# Patient Record
Sex: Male | Born: 1970 | Race: Black or African American | Hispanic: No | Marital: Single
Health system: Midwestern US, Community
[De-identification: ages and names within clinical notes are randomized; demographics above are authoritative.]

## PROBLEM LIST (undated history)

## (undated) DIAGNOSIS — I10 Essential (primary) hypertension: Secondary | ICD-10-CM

## (undated) DIAGNOSIS — G4733 Obstructive sleep apnea (adult) (pediatric): Secondary | ICD-10-CM

## (undated) DIAGNOSIS — G4730 Sleep apnea, unspecified: Secondary | ICD-10-CM

## (undated) DIAGNOSIS — E669 Obesity, unspecified: Secondary | ICD-10-CM

## (undated) DIAGNOSIS — I471 Supraventricular tachycardia, unspecified: Secondary | ICD-10-CM

## (undated) DIAGNOSIS — I498 Other specified cardiac arrhythmias: Secondary | ICD-10-CM

## (undated) DIAGNOSIS — K219 Gastro-esophageal reflux disease without esophagitis: Secondary | ICD-10-CM

## (undated) DIAGNOSIS — I5030 Unspecified diastolic (congestive) heart failure: Secondary | ICD-10-CM

## (undated) DIAGNOSIS — R001 Bradycardia, unspecified: Secondary | ICD-10-CM

## (undated) DIAGNOSIS — K3184 Gastroparesis: Secondary | ICD-10-CM

## (undated) DIAGNOSIS — K30 Functional dyspepsia: Secondary | ICD-10-CM

## (undated) DIAGNOSIS — K2270 Barrett's esophagus without dysplasia: Secondary | ICD-10-CM

## (undated) HISTORY — DX: Sleep apnea, unspecified: G47.30

## (undated) HISTORY — DX: Gastroparesis: K31.84

## (undated) HISTORY — PX: INSERT / REPLACE / REMOVE PACEMAKER: SUR710

## (undated) HISTORY — DX: Supraventricular tachycardia, unspecified: I47.10

## (undated) HISTORY — DX: Bradycardia, unspecified: R00.1

## (undated) HISTORY — DX: Gastro-esophageal reflux disease without esophagitis: K21.9

## (undated) HISTORY — DX: Essential (primary) hypertension: I10

## (undated) HISTORY — DX: Obstructive sleep apnea (adult) (pediatric): G47.33

## (undated) HISTORY — DX: Supraventricular tachycardia: I47.1

## (undated) HISTORY — PX: UPPER GASTROINTESTINAL ENDOSCOPY: SHX188

## (undated) HISTORY — DX: Barrett's esophagus without dysplasia: K22.70

## (undated) HISTORY — DX: Obesity, unspecified: E66.9

## (undated) HISTORY — PX: COLONOSCOPY: SHX174

## (undated) HISTORY — DX: Unspecified diastolic (congestive) heart failure: I50.30

## (undated) HISTORY — DX: Other specified cardiac arrhythmias: I49.8

---

## 1992-07-23 HISTORY — PX: APPENDECTOMY: SHX54

## 2009-04-11 ENCOUNTER — Ambulatory Visit (HOSPITAL_BASED_OUTPATIENT_CLINIC_OR_DEPARTMENT_OTHER): Admission: RE | Admit: 2009-04-11 | Discharge: 2009-04-11 | Payer: Self-pay | Admitting: Cardiology

## 2009-07-22 ENCOUNTER — Emergency Department (HOSPITAL_COMMUNITY): Admission: EM | Admit: 2009-07-22 | Discharge: 2009-07-22 | Payer: Self-pay | Admitting: Emergency Medicine

## 2009-11-09 ENCOUNTER — Emergency Department (HOSPITAL_COMMUNITY): Admission: EM | Admit: 2009-11-09 | Discharge: 2009-11-09 | Payer: Self-pay | Admitting: Emergency Medicine

## 2010-04-15 ENCOUNTER — Encounter: Payer: Self-pay | Admitting: Internal Medicine

## 2010-04-25 ENCOUNTER — Emergency Department (HOSPITAL_COMMUNITY)
Admission: EM | Admit: 2010-04-25 | Discharge: 2010-04-25 | Payer: Self-pay | Source: Home / Self Care | Admitting: Emergency Medicine

## 2010-04-28 ENCOUNTER — Encounter (INDEPENDENT_AMBULATORY_CARE_PROVIDER_SITE_OTHER): Payer: Self-pay | Admitting: *Deleted

## 2010-05-11 ENCOUNTER — Telehealth (INDEPENDENT_AMBULATORY_CARE_PROVIDER_SITE_OTHER): Payer: Self-pay | Admitting: *Deleted

## 2010-05-25 ENCOUNTER — Encounter (INDEPENDENT_AMBULATORY_CARE_PROVIDER_SITE_OTHER): Payer: Self-pay | Admitting: *Deleted

## 2010-06-14 ENCOUNTER — Ambulatory Visit: Payer: Self-pay | Admitting: Cardiology

## 2010-06-29 ENCOUNTER — Emergency Department (HOSPITAL_COMMUNITY): Admission: EM | Admit: 2010-06-29 | Discharge: 2009-07-27 | Payer: Self-pay | Admitting: Internal Medicine

## 2010-08-02 ENCOUNTER — Encounter (INDEPENDENT_AMBULATORY_CARE_PROVIDER_SITE_OTHER): Payer: Self-pay | Admitting: *Deleted

## 2010-08-22 NOTE — Letter (Signed)
Summary: Appointment - Missed  [facility], Main Office  [address]   [place], [place] [address]   Phone: [phone]  Fax: [phone]     April 28, 2010 MRN: [id number]   [facility] [address] [place], [place]  [address]   Dear Jim Hill,  Our records indicate you missed your appointment on 04-27-10 with the [facility].                     It is very important that we reach you to reschedule this appointment. We look forward to participating in your health care needs. Please contact us at the number listed above at your earliest convenience to reschedule this appointment.     Sincerely,    Glass blower/designer

## 2010-08-22 NOTE — Progress Notes (Signed)
  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

## 2010-08-22 NOTE — Letter (Signed)
Summary: Appointment - Missed  [facility], Main Office  [address]   [place], [place] [address]   Phone: [phone]  Fax: [phone]     May 25, 2010 MRN: [id number]   [facility] [address] [place], [place]  [address]   Dear Mr. DENAULT,  Our records indicate you missed your appointment on  04-27-10  with the [facility].  It is very important that we reach you to reschedule this appointment. We look forward to participating in your health care needs. Please contact us at the number listed above at your earliest convenience to reschedule this appointment.     Sincerely,    Glass blower/designer

## 2010-08-22 NOTE — Miscellaneous (Signed)
Summary: Device preload  Clinical Lists Changes  Observations: Added new observation of PPM INDICATN: Brady (04/15/2010 11:42) Added new observation of MAGNET RTE: BOL 85 ERI 65 (04/15/2010 11:42) Added new observation of PPMLEADSTAT2: active (04/15/2010 11:42) Added new observation of PPMLEADSER2: [id number] (04/15/2010 11:42) Added new observation of PPMLEADMOD2: 5076  (04/15/2010 11:42) Added new observation of PPMLEADDOI2: 03/14/2005  (04/15/2010 11:42) Added new observation of PPMLEADLOC2: RV  (04/15/2010 11:42) Added new observation of PPMLEADSTAT1: active  (04/15/2010 11:42) Added new observation of PPMLEADSER1: [id number]  (04/15/2010 11:42) Added new observation of PPMLEADMOD1: 5076  (04/15/2010 11:42) Added new observation of PPMLEADDOI1: 03/14/2005  (04/15/2010 11:42) Added new observation of PPMLEADLOC1: RA  (04/15/2010 11:42) Added new observation of PPM IMP MD: NOT IMPLANTED BY [place]  (04/15/2010 11:42) Added new observation of PPM DOI: 03/14/2005  (04/15/2010 11:42) Added new observation of PPM SERL#: [id number]  (04/15/2010 11:42) Added new observation of PPM MODL#: [id number]  (04/15/2010 11:42) Added new observation of PACEMAKERMFG: Medtronic  (04/15/2010 11:42) Added new observation of PPM REFER MD: Vonna Drafts  (04/15/2010 11:42) Added new observation of PACEMAKER MD: Hillis Range, MD  (04/15/2010 11:42)      PPM Specifications Following MD:  Hillis Range, MD     Referring MD:  Vonna Drafts PPM Vendor:  Medtronic     PPM Model Number:  [id number]     PPM Serial Number:  [id number] PPM DOI:  03/14/2005     PPM Implanting MD:  NOT IMPLANTED BY [place]  Lead 1    Location: RA     DOI: 03/14/2005     Model #: [id number]     Serial #: [id number]     Status: active Lead 2    Location: RV     DOI: 03/14/2005     Model #: [id number]     Serial #: [id number]     Status: active  Magnet Response Rate:  BOL 85 ERI 65  Indications:  Huston Foley

## 2010-08-24 NOTE — Letter (Signed)
Summary: Device-Delinquent Check  [facility], Main Office  [address]   [place], [place] [address]   Phone: [phone]  Fax: [phone]     August 02, 2010 MRN: [id number]   [facility] [address] [place], [place]  [address]   Dear Mr. MONACO,  According to our records, you have not had your implanted device checked in the recommended period of time.  We are unable to determine appropriate device function without checking your device on a regular basis.  Please call our office to schedule an appointment , with Dr Thera Flake soon as possible.  If you are having your device checked by another physician, please call us so that we may update our records.  Thank you,  Letta Moynahan, EMT  August 02, 2010 4:21 PM  [facility] certified

## 2010-08-28 ENCOUNTER — Encounter: Payer: Self-pay | Admitting: Internal Medicine

## 2010-10-05 LAB — CBC
HCT: 39.9 % (ref 39.0–52.0)
Hemoglobin: 13.6 g/dL (ref 13.0–17.0)
MCH: 28.6 pg (ref 26.0–34.0)
MCHC: 34.1 g/dL (ref 30.0–36.0)
MCV: 84 fL (ref 78.0–100.0)
Platelets: 232 10*3/uL (ref 150–400)
RBC: 4.75 MIL/uL (ref 4.22–5.81)
RDW: 12.9 % (ref 11.5–15.5)
WBC: 6.8 10*3/uL (ref 4.0–10.5)

## 2010-10-05 LAB — BASIC METABOLIC PANEL
BUN: 10 mg/dL (ref 6–23)
CO2: 22 mEq/L (ref 19–32)
Calcium: 8.9 mg/dL (ref 8.4–10.5)
Chloride: 106 mEq/L (ref 96–112)
Creatinine, Ser: 1.28 mg/dL (ref 0.4–1.5)
GFR calc Af Amer: >60 mL/min (ref >60)
GFR calc non Af Amer: >60 mL/min (ref >60)
Glucose, Bld: 117 mg/dL — ABNORMAL HIGH (ref 70–99)
Potassium: 3.5 mEq/L (ref 3.5–5.1)
Sodium: 136 mEq/L (ref 135–145)

## 2010-10-08 LAB — DIFFERENTIAL
Basophils Absolute: 0 10*3/uL (ref 0.0–0.1)
Basophils Relative: 0 % (ref 0–1)
Eosinophils Absolute: 0.2 10*3/uL (ref 0.0–0.7)
Eosinophils Relative: 4 % (ref 0–5)
Lymphocytes Relative: 39 % (ref 12–46)
Lymphs Abs: 2.3 10*3/uL (ref 0.7–4.0)
Monocytes Absolute: 0.5 10*3/uL (ref 0.1–1.0)
Monocytes Relative: 9 % (ref 3–12)
Neutro Abs: 2.8 10*3/uL (ref 1.7–7.7)
Neutrophils Relative %: 48 % (ref 43–77)

## 2010-10-08 LAB — POCT I-STAT, CHEM 8
BUN: 16 mg/dL (ref 6–23)
Calcium, Ion: 1.11 mmol/L — ABNORMAL LOW (ref 1.12–1.32)
Chloride: 107 mEq/L (ref 96–112)
Creatinine, Ser: 1.5 mg/dL (ref 0.4–1.5)
Glucose, Bld: 85 mg/dL (ref 70–99)
HCT: 42 % (ref 39.0–52.0)
Hemoglobin: 14.3 g/dL (ref 13.0–17.0)
Potassium: 4 mEq/L (ref 3.5–5.1)
Sodium: 141 mEq/L (ref 135–145)
TCO2: 29 mmol/L (ref 0–100)

## 2010-10-08 LAB — CBC
HCT: 41.5 % (ref 39.0–52.0)
Hemoglobin: 14 g/dL (ref 13.0–17.0)
MCHC: 33.7 g/dL (ref 30.0–36.0)
MCV: 86.5 fL (ref 78.0–100.0)
Platelets: 233 10*3/uL (ref 150–400)
RBC: 4.8 MIL/uL (ref 4.22–5.81)
RDW: 13.8 % (ref 11.5–15.5)
WBC: 5.8 10*3/uL (ref 4.0–10.5)

## 2010-10-08 LAB — POCT CARDIAC MARKERS
CKMB, poc: 3.2 ng/mL (ref 1.0–8.0)
CKMB, poc: 5.2 ng/mL (ref 1.0–8.0)
Myoglobin, poc: 252 ng/mL (ref 12–200)
Myoglobin, poc: 345 ng/mL (ref 12–200)
Troponin i, poc: <0.05 ng/mL (ref 0.00–0.09)
Troponin i, poc: <0.05 ng/mL (ref 0.00–0.09)

## 2010-10-10 LAB — POCT I-STAT, CHEM 8
BUN: 13 mg/dL (ref 6–23)
Calcium, Ion: 1.1 mmol/L — ABNORMAL LOW (ref 1.12–1.32)
Chloride: 106 mEq/L (ref 96–112)
Creatinine, Ser: 1.3 mg/dL (ref 0.4–1.5)
Glucose, Bld: 105 mg/dL — ABNORMAL HIGH (ref 70–99)
HCT: 44 % (ref 39.0–52.0)
Hemoglobin: 15 g/dL (ref 13.0–17.0)
Potassium: 3.8 mEq/L (ref 3.5–5.1)
Sodium: 141 mEq/L (ref 135–145)
TCO2: 25 mmol/L (ref 0–100)

## 2010-10-10 NOTE — Cardiovascular Report (Signed)
Summary: Certified Letter Returned (not doing f/u)  Certified Letter Returned (not doing f/u)   Imported By: Debby Freiberg 10/03/2010 16:36:40  _____________________________________________________________________  External Attachment:    Type:   Image     Comment:   External Document

## 2010-11-06 ENCOUNTER — Other Ambulatory Visit: Payer: Self-pay | Admitting: Cardiology

## 2010-11-06 DIAGNOSIS — I10 Essential (primary) hypertension: Secondary | ICD-10-CM

## 2010-11-06 MED ORDER — ATENOLOL 50 MG PO TABS: 50.0000 mg | ORAL_TABLET | Freq: Every day | ORAL | Status: DC

## 2010-11-06 NOTE — Telephone Encounter (Signed)
PT NEEDS REFILL FOR ANTENOLOL/USES [facility]. CHART PLACED IN BOX.

## 2010-11-06 NOTE — Telephone Encounter (Signed)
Called requesting refill on Atenolol. escribed to [facility]

## 2010-11-15 ENCOUNTER — Telehealth: Payer: Self-pay | Admitting: Cardiology

## 2010-11-15 NOTE — Telephone Encounter (Signed)
PATIENT WANTS TO TALK TO ANITA ABOUT GETTING ANOTHER STRESS TEST.

## 2010-11-15 NOTE — Telephone Encounter (Signed)
Called stating his work nurse told him he needed to have another stress test for insurance purposes.. Advised him that stress test from 08 EF was 50%. He can't pay out of pocket now because ins will claim this is preexisting. He will talk with nurse again and have her call me to see if he really does need to have another stress test.

## 2010-11-23 ENCOUNTER — Encounter: Payer: Self-pay | Admitting: *Deleted

## 2011-01-01 ENCOUNTER — Telehealth: Payer: Self-pay | Admitting: Cardiology

## 2011-01-01 NOTE — Telephone Encounter (Signed)
Called stating he needs letter from Dr.Jordan to write an appeal letter to his insurance co for his preexisting pacemaker. Spoke w/ his company at [phone]. She states that will have to call [employer] [place] [phone] after 7/1 to appeal to exempt him due his pacemaker so he can obtain insurance without the 6-12 mo delay. Will call after 7/1

## 2011-01-01 NOTE — Telephone Encounter (Signed)
Has a question regarding his pre-existing condition as it relates to his insurance. He said that he needs a note from Dr.Jordan

## 2011-02-01 ENCOUNTER — Telehealth: Payer: Self-pay | Admitting: *Deleted

## 2011-02-01 NOTE — Telephone Encounter (Signed)
Received call stating he has started new job and needs a preexisting form filled out so he can receive his new insurance without delay. Because of his pacemaker  they are telling him  we need to send info so he can receive benefits without 6 mo delay. [address] [place] [phone]; ID # [id number] and spoke w/Tara. She sent form for predetermination and said to change to Preexisting. Filled form out and faxed back to [phone] w/ copies ov from 06/14/10;09/30/09;03/17/09.

## 2011-02-26 ENCOUNTER — Telehealth: Payer: Self-pay | Admitting: Internal Medicine

## 2011-02-26 NOTE — Telephone Encounter (Signed)
Called and spoke with patient   Jim Hill to Urgent Care  He describes this pain as "he is catching a cramp"  This just started one week ago  His device was put in at [facility] 2006  Dr Sw[place] has records and we will get OP note from him

## 2011-02-26 NOTE — Telephone Encounter (Signed)
Pt calling re new pt appt 9-7 with dr Ladona Ridgel, having a pulling sensation at pacer site, not sure if he should be seen sooner or not

## 2011-03-29 ENCOUNTER — Encounter: Payer: Self-pay | Admitting: *Deleted

## 2011-03-29 ENCOUNTER — Encounter: Payer: Self-pay | Admitting: Internal Medicine

## 2011-03-30 ENCOUNTER — Ambulatory Visit (INDEPENDENT_AMBULATORY_CARE_PROVIDER_SITE_OTHER): Payer: BC Managed Care – PPO | Admitting: Internal Medicine

## 2011-03-30 ENCOUNTER — Encounter: Payer: Self-pay | Admitting: Internal Medicine

## 2011-03-30 DIAGNOSIS — I495 Sick sinus syndrome: Secondary | ICD-10-CM

## 2011-03-30 DIAGNOSIS — E669 Obesity, unspecified: Secondary | ICD-10-CM

## 2011-03-30 DIAGNOSIS — I10 Essential (primary) hypertension: Secondary | ICD-10-CM | POA: Insufficient documentation

## 2011-03-30 DIAGNOSIS — Z950 Presence of cardiac pacemaker: Secondary | ICD-10-CM | POA: Insufficient documentation

## 2011-03-30 LAB — PACEMAKER DEVICE OBSERVATION
AL AMPLITUDE: 1.5 mv
AL IMPEDENCE PM: 368 Ohm
ATRIAL PACING PM: 19.71
BAMS-0001: 170 {beats}/min
BATTERY VOLTAGE: 2.97 V
RV LEAD AMPLITUDE: 7.6 mv
RV LEAD IMPEDENCE PM: 544 Ohm
RV LEAD THRESHOLD: 1 V
VENTRICULAR PACING PM: 0.17

## 2011-03-30 NOTE — Progress Notes (Signed)
HPI Jim Hill is referred today for ongoing PPM evaluation. He is a pleasant 40 yo man with a h/o symptomatic bradycardia, s/p PPM. He has morbid obesity. He has hypertension. Since his pacemaker was placed, he has had no recurrent syncope. 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 He admits to dietary indiscretion. He notes occasional headaches and peripheral edema. No other symptoms noted today. No Known Allergies   Current Outpatient Prescriptions  Medication Sig Dispense Refill  . amLODipine (NORVASC) 10 MG tablet 1 tab daily      . aspirin 81 MG tablet Take 81 mg by mouth daily.        . hydrochlorothiazide 25 MG tablet Take 25 mg by mouth daily.        Marland Kitchen omeprazole (PRILOSEC) 20 MG capsule Take 20 mg by mouth daily.           Past Medical History  Diagnosis Date  . Hypertension     ROS:   All systems reviewed and negative except as noted in the HPI.   Past Surgical History  Procedure Date  . Insert / replace / remove pacemaker     status post pacemaker implant August 2006  . Appendectomy 1994     No family history on file.   History   Social History  . Marital Status: Single    Spouse Name: N/A    Number of Children: N/A  . Years of Education: N/A   Occupational History  . Not on file.   Social History Main Topics  . Smoking status: Former Games developer  . Smokeless tobacco: Not on file   Comment: recreational drugs  . Alcohol Use: Not on file  . Drug Use: Not on file  . Sexually Active: Not on file   Other Topics Concern  . Not on file   Social History Narrative   Tobacco history none. He does admit to using recreational drugs in the past. He has not smoked marijuana in 7 years. 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 Both of his parents are in there 50 s and are  in good health.     BP 130/82  Pulse 98  Wt 295 lb (133.811 kg)  Physical Exam:  Obese but well appearing NAD HEENT:  Unremarkable Neck:  No JVD, no thyromegally Lymphatics:  No adenopathy Back:  No CVA tenderness Lungs:  Clear with no wheezes, rales, or rhonchi. HEART:  Regular rate rhythm, no murmurs, no rubs, no clicks Abd:  Obese, soft, positive bowel sounds, no organomegally, no rebound, no guarding Ext:  2 plus pulses, no edema, no cyanosis, no clubbing Skin:  No rashes no nodules Neuro:  CN II through XII intact, motor grossly intact  DEVICE  Normal device function.  See PaceArt for details.    Assess/Plan:

## 2011-03-30 NOTE — Assessment & Plan Note (Signed)
His blood pressure today is well controlled. He will continue his current medical therapy and maintain a low-sodium diet. 

## 2011-03-30 NOTE — Assessment & Plan Note (Signed)
I discussed the importance of weight loss with the patient. He weighs 295 pounds and should be under 250 pounds. Maintaining a low-fat low-sodium diet has also been discussed.

## 2011-03-30 NOTE — Assessment & Plan Note (Signed)
His device is working normally. He has 1-2 years of battery longevity remaining. We'll plan to recheck in several months.

## 2011-03-30 NOTE — Patient Instructions (Signed)
Your physician wants you to follow-up in: 6 months with device clinic and 12 months with Dr Taylor You will receive a reminder letter in the mail two months in advance. If you don't receive a letter, please call our office to schedule the follow-up appointment.  

## 2011-06-20 ENCOUNTER — Telehealth: Payer: Self-pay | Admitting: Internal Medicine

## 2011-06-20 NOTE — Telephone Encounter (Signed)
Pt having some chest pain off and on, has flutters feels like floating in water happens daily, pls advise

## 2011-06-20 NOTE — Telephone Encounter (Signed)
Returned call to patient, and left a message for him to call me back

## 2011-06-21 NOTE — Telephone Encounter (Signed)
Pt rtn call from yesterday, pls call [phone]

## 2011-06-21 NOTE — Telephone Encounter (Signed)
Called the # [phone] and left another message for the patient to call me back.

## 2011-06-26 NOTE — Telephone Encounter (Signed)
Has a fluttering from time to time Thinks it may be related to something he eats or drinks This all started for 3 weeks and it happens daily  Last for 10-20 mins and it stops by resting or if he goes about his day  He will notice that it has gone  Notices it the most at night  Will schedule him an appointment to come in and have his device checked as well as follow up with Dr Ladona Ridgel

## 2011-06-28 NOTE — Telephone Encounter (Signed)
Discussed with Dr Ladona Ridgel and will have pt come in for device check to see if this shows anything and go from there  If there are abnormalities schedule follow up with Ladona Ridgel if normal reassure

## 2011-07-02 ENCOUNTER — Encounter: Payer: BC Managed Care – PPO | Admitting: *Deleted

## 2011-07-09 ENCOUNTER — Encounter: Payer: BC Managed Care – PPO | Admitting: *Deleted

## 2011-07-09 ENCOUNTER — Ambulatory Visit (INDEPENDENT_AMBULATORY_CARE_PROVIDER_SITE_OTHER): Payer: BC Managed Care – PPO | Admitting: *Deleted

## 2011-07-09 ENCOUNTER — Encounter: Payer: Self-pay | Admitting: Internal Medicine

## 2011-07-09 ENCOUNTER — Ambulatory Visit (INDEPENDENT_AMBULATORY_CARE_PROVIDER_SITE_OTHER): Payer: BC Managed Care – PPO | Admitting: Physician Assistant

## 2011-07-09 ENCOUNTER — Encounter: Payer: Self-pay | Admitting: *Deleted

## 2011-07-09 ENCOUNTER — Encounter: Payer: Self-pay | Admitting: Physician Assistant

## 2011-07-09 VITALS — BP 142/86 | HR 71 | Ht 71.0 in | Wt 302.0 lb

## 2011-07-09 DIAGNOSIS — R002 Palpitations: Secondary | ICD-10-CM

## 2011-07-09 DIAGNOSIS — Z950 Presence of cardiac pacemaker: Secondary | ICD-10-CM

## 2011-07-09 DIAGNOSIS — K219 Gastro-esophageal reflux disease without esophagitis: Secondary | ICD-10-CM

## 2011-07-09 DIAGNOSIS — R0989 Other specified symptoms and signs involving the circulatory and respiratory systems: Secondary | ICD-10-CM

## 2011-07-09 DIAGNOSIS — I10 Essential (primary) hypertension: Secondary | ICD-10-CM

## 2011-07-09 DIAGNOSIS — R0683 Snoring: Secondary | ICD-10-CM | POA: Insufficient documentation

## 2011-07-09 DIAGNOSIS — I498 Other specified cardiac arrhythmias: Secondary | ICD-10-CM

## 2011-07-09 DIAGNOSIS — R079 Chest pain, unspecified: Secondary | ICD-10-CM

## 2011-07-09 DIAGNOSIS — R0609 Other forms of dyspnea: Secondary | ICD-10-CM

## 2011-07-09 LAB — PACEMAKER DEVICE OBSERVATION
AL AMPLITUDE: 1.4578 mv
AL IMPEDENCE PM: 348 Ohm
AL THRESHOLD: 1 V
ATRIAL PACING PM: 16.33
BAMS-0001: 170 {beats}/min
BATTERY VOLTAGE: 2.96 V
RV LEAD AMPLITUDE: 4.4845 mv
RV LEAD IMPEDENCE PM: 464 Ohm
RV LEAD THRESHOLD: 1 V
VENTRICULAR PACING PM: 0.13

## 2011-07-09 LAB — BASIC METABOLIC PANEL
CO2: 26 mEq/L (ref 19–32)
Calcium: 9.3 mg/dL (ref 8.4–10.5)
Chloride: 108 mEq/L (ref 96–112)
Creatinine, Ser: 1.2 mg/dL (ref 0.4–1.5)
GFR: 86.27 mL/min (ref >60.00)
Glucose, Bld: 90 mg/dL (ref 70–99)
Sodium: 141 mEq/L (ref 135–145)

## 2011-07-09 MED ORDER — OMEPRAZOLE 20 MG PO CPDR: 40.0000 mg | DELAYED_RELEASE_CAPSULE | Freq: Every day | ORAL | Status: DC

## 2011-07-09 NOTE — Assessment & Plan Note (Addendum)
Etiology unclear.  Symptoms somewhat atypical.  Will obtain records from hospital in [place].  Will need to see CT scan report and ED report.  Apparently had negative heart cath in 2008.  Has risk factors of obesity, HTN, substance abuse and sedentary lifestyle (truck driver).  Will arrange ETT-echo.  Follow up with Dr. [name] or me in 3-4 weeks.

## 2011-07-09 NOTE — Patient Instructions (Signed)
Your physician has requested that you have an echocardiogram DX DYSPNEA AND CHEST PAIN. Echocardiography is a painless test that uses sound waves to create images of your heart. It provides your doctor with information about the size and shape of your heart and how well your heart's chambers and valves are working. This procedure takes approximately one hour. There are no restrictions for this procedure.  Your physician has requested that you have a stress echocardiogram W/O CONTRAST DX DYSPNEA AND CHEST PAIN. For further information please visit [email or web address] Please follow instruction sheet as given.  You have been referred to EITHER TO DR. CLANCE OR DR. SOOD FOR SNORING AND FOR POSSIBLE SLEEP APNEA  Your physician recommends that you return for lab work in: TODAY BMET/BNP 401.1 HTN  Your physician has recommended you make the following change in your medication: INCREASE PRILOSEC 40 MG DAILY

## 2011-07-09 NOTE — Assessment & Plan Note (Signed)
Concern for Afib as he likely has sleep apnea.  However, I spoke with EP RN.  No high atrial rates noted on interrogation of his device.  No need for event monitor at this time.  Will evaluate for sleep apnea.  Obtain records from [place] as noted.

## 2011-07-09 NOTE — Assessment & Plan Note (Signed)
Possible cause of his symptoms.  Increase Prilosec to 40 mg QD.  Consider referral to GI based upon results of current testing.

## 2011-07-09 NOTE — Assessment & Plan Note (Addendum)
He apparently had a sleep test a few years ago.  However, he notes that he sleeps on an incline and awakens in the middle of the night short of breath.  No signs of volume overload today.  Will check BMET and BNP.  Check 2D Echo given cardiomegaly and LAE noted on CT.  He snores and has daytime hypersomnolence.  I will refer to sleep medicine for evaluation.

## 2011-07-09 NOTE — Progress Notes (Signed)
[address] [place], [place]  [address] Phone: [phone] Fax:  [phone]  Date:  07/09/2011   Name:  Jim Hill       DOB:  11-13-1970 MRN:  [id number]  PCP:  Dr. Nehemiah Hill Primary Cardiologist:  Dr. Lewayne Hill  Primary Electrophysiologist:  Dr. Lewayne Hill    History of Present Illness: Jim Hill is a 40 y.o. male who presents as an add on for chest pain.  He was in the pacer clinic today for routine visit and noted his symptoms to the nurse.  He has a history of symptomatic bradycardia, status post pacemaker implant in 2006, morbid obesity and hypertension.  He was previously followed by Dr. Peter Hill before switching to Dr. Lewayne Hill.  He reportedly has a normal heart catheterization done in 2008 and a normal stress echocardiogram done in August 2010. His heart catheterization was done in [place].    He is a Naval architect.  Over the weekend, he was in [place] and woke up in the middle of the night with palpitations and chest pain.  He went to the emergency room at the [facility] in [place], [place].  He apparently ruled out for myocardial infarction.  He apparently had a CT scan of his chest.  He has a piece of paper with him that says cardiomegaly was noted as well as left atrial enlargement and an echocardiogram was suggested.    The patient notes left-sided chest aching over the past month.  He can note it with exertion.  He can also note it at rest.  Exertion does not necessarily bring it on.  He does feel short of breath and diaphoretic with his symptoms.  He feels his heart fluttering.  He has syncope or near syncope.  He does feel some arm pain.  He also feels some pain in his back.  His blood pressure was apparently in the 170s/90s in the hospital in [place] this past weekend.  He was not told his heart was in any type of arrhythmia at that time.  Of note, interrogation of his pacemaker today demonstrated no  high atrial heart rates.  He does note difficulty with belching and acid reflux.  Food does not necessarily bring on chest pain.  He denies pleuritic chest symptoms.  Of note, his symptoms typically last 30 mins to 1 hour.    Past Medical History  Diagnosis Date  . Hypertension     Current Outpatient Prescriptions  Medication Sig Dispense Refill  . aspirin 81 MG tablet Take 81 mg by mouth daily.        Marland Kitchen omeprazole (PRILOSEC) 20 MG capsule Take 20 mg by mouth daily.          Allergies: No Known Allergies  History  Substance Use Topics  . Smoking status: Former Games developer  . Smokeless tobacco: Not on file   Comment: recreational drugs  . Alcohol Use: Not on file     ROS:  Please see the history of present illness.   Gastrointestinal ROS: positive for - diarrhea.  Cardiovascular ROS: positive for - orthopnea and paroxysmal nocturnal dyspnea and negative for - edema.  All other systems reviewed and negative.   PHYSICAL EXAM: VS:  BP 142/86  Pulse 71  Ht 5\' 11"  (1.803 m)  Wt 302 lb (136.986 kg)  BMI 42.12 kg/m2 Well nourished, well developed, in no acute distress HEENT: normal Neck: no JVD Endocrine:  No thyromegaly Cardiac:  normal S1,  S2; RRR; no murmur Lungs:  clear to auscultation bilaterally, no wheezing, rhonchi or rales Abd: soft, nontender, no hepatomegaly Ext: no edema Skin: warm and dry Neuro:  CNs 2-12 intact, no focal abnormalities noted Psych: normal affect  EKG:   Sinus rhythm, heart rate 71, normal axis, nonspecific ST-T wave changes, no change from prior tracings  ASSESSMENT AND PLAN:

## 2011-07-09 NOTE — Assessment & Plan Note (Signed)
Uncontrolled.  However, medications recently changed by PCP.  He is not sure of name.  Monitor for now.

## 2011-07-09 NOTE — Assessment & Plan Note (Signed)
As noted, pacer was interrogated today and was functioning appropriately.

## 2011-07-09 NOTE — Progress Notes (Signed)
Patient presents for device clinic pacemaker check.  Has been having problems with chest pain for about 2-3 weeks.  Pain occurs with exertion, associated shortness of breath and diaphoresis.  Usually lasts 20-30 minutes, relieved with rest and Aspirin.  Not always associated with exertion.   Device interrogated and found to be functioning normally.  Some high rate episodes, are sinus tachycardia.   No changes made today.  See PaceArt report for full details.  Discussed chest pain with Tereso Newcomer, PA, he will see patient today at 11AM.  Patient was taking Amlodipine and HCTZ, states MD changed to combination drug, not sure of name.    Gypsy Balsam, RN, BSN 07/09/2011 10:00 AM

## 2011-07-10 ENCOUNTER — Telehealth: Payer: Self-pay | Admitting: Physician Assistant

## 2011-07-10 NOTE — Telephone Encounter (Addendum)
ROI faxed to [facility] @ [phone] to Obtain Records For [facility] 07/10/11/km  Records Received from [place] gave to [facility] 07/10/11/KM

## 2011-07-19 ENCOUNTER — Ambulatory Visit (HOSPITAL_COMMUNITY): Payer: BC Managed Care – PPO | Attending: Internal Medicine | Admitting: Radiology

## 2011-07-19 ENCOUNTER — Other Ambulatory Visit (HOSPITAL_COMMUNITY): Payer: Self-pay | Admitting: Cardiology

## 2011-07-19 DIAGNOSIS — R0609 Other forms of dyspnea: Secondary | ICD-10-CM

## 2011-07-19 DIAGNOSIS — I10 Essential (primary) hypertension: Secondary | ICD-10-CM

## 2011-07-19 DIAGNOSIS — R079 Chest pain, unspecified: Secondary | ICD-10-CM

## 2011-07-19 DIAGNOSIS — R002 Palpitations: Secondary | ICD-10-CM

## 2011-07-19 DIAGNOSIS — R072 Precordial pain: Secondary | ICD-10-CM | POA: Insufficient documentation

## 2011-07-19 DIAGNOSIS — R0989 Other specified symptoms and signs involving the circulatory and respiratory systems: Secondary | ICD-10-CM

## 2011-07-19 MED ORDER — PERFLUTREN PROTEIN A MICROSPH IV SUSP
3.0000 mL | Freq: Once | INTRAVENOUS | Status: AC
  Administered 2011-07-19: 3 mL via INTRAVENOUS

## 2011-07-20 NOTE — Progress Notes (Signed)
Addended by: Micki Riley C on: 07/20/2011 12:46 PM   Modules accepted: Orders

## 2011-07-20 NOTE — Progress Notes (Signed)
Addended by: Micki Riley C on: 07/20/2011 01:18 PM   Modules accepted: Orders

## 2011-07-23 NOTE — Progress Notes (Signed)
Addended by: Lacie Scotts on: 07/23/2011 02:11 PM   Modules accepted: Orders

## 2011-07-25 ENCOUNTER — Telehealth: Payer: Self-pay | Admitting: Internal Medicine

## 2011-07-25 NOTE — Telephone Encounter (Signed)
Informed patient of results/KLanier,RN  

## 2011-07-25 NOTE — Telephone Encounter (Signed)
Fu call °Patient returning your call °

## 2011-08-02 ENCOUNTER — Institutional Professional Consult (permissible substitution): Payer: BC Managed Care – PPO | Admitting: Pulmonary Disease

## 2011-08-02 NOTE — Progress Notes (Signed)
Addended by: Laurell Coalson S on: 08/02/2011 09:00 AM   Modules accepted: Orders  

## 2011-08-07 ENCOUNTER — Ambulatory Visit: Payer: BC Managed Care – PPO | Admitting: Physician Assistant

## 2011-10-16 ENCOUNTER — Emergency Department: Payer: Self-pay | Admitting: *Deleted

## 2011-10-16 LAB — CBC
HCT: 40 % (ref 40.0–52.0)
HGB: 13.5 g/dL (ref 13.0–18.0)
MCH: 28.6 pg (ref 26.0–34.0)

## 2011-10-16 LAB — BASIC METABOLIC PANEL
Anion Gap: 10 (ref 7–16)
Chloride: 105 mmol/L (ref 98–107)
Creatinine: 1.36 mg/dL — ABNORMAL HIGH (ref 0.60–1.30)
EGFR (Non-African Amer.): >60
Glucose: 96 mg/dL (ref 65–99)
Osmolality: 282 (ref 275–301)
Potassium: 4.2 mmol/L (ref 3.5–5.1)
Sodium: 142 mmol/L (ref 136–145)

## 2011-10-17 LAB — TROPONIN I: Troponin-I: <0.02 ng/mL

## 2011-10-24 ENCOUNTER — Telehealth: Payer: Self-pay | Admitting: Internal Medicine

## 2011-10-24 NOTE — Telephone Encounter (Signed)
New Msg: Pt calling wanting to speak with nurse/MD regarding pt getting another sleep study. Please return pt call to discuss further.

## 2011-10-24 NOTE — Telephone Encounter (Signed)
I spoke with patient and let him

## 2011-10-24 NOTE — Telephone Encounter (Signed)
I spoke with him and let him know that he can call over to Dr Shelle Iron at [phone] and reschedule hsi appointment but it has been since Jan.  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  I told him that it's been almost 3 months since he canceled the first appointment and he really needed to call and reschedule  He verbalized the understanding

## 2011-11-23 ENCOUNTER — Telehealth: Payer: Self-pay | Admitting: Internal Medicine

## 2011-11-23 NOTE — Telephone Encounter (Signed)
PT CALLING FOR STRESS TEST RESULTS FORM December, PLS CALL

## 2011-11-23 NOTE — Telephone Encounter (Signed)
Spoke with patient  He could not remember his stress test results.  I gave him the results on 07/25/11.  He remembers [place] talking but does not remember the results.  Gave him the results again

## 2011-12-26 ENCOUNTER — Encounter: Payer: Self-pay | Admitting: Internal Medicine

## 2011-12-26 ENCOUNTER — Ambulatory Visit (INDEPENDENT_AMBULATORY_CARE_PROVIDER_SITE_OTHER): Payer: Self-pay | Admitting: Internal Medicine

## 2011-12-26 VITALS — BP 124/90 | HR 80 | Ht 71.0 in | Wt 304.4 lb

## 2011-12-26 DIAGNOSIS — Z950 Presence of cardiac pacemaker: Secondary | ICD-10-CM

## 2011-12-26 LAB — PACEMAKER DEVICE OBSERVATION
AL AMPLITUDE: 1.6 mv
AL IMPEDENCE PM: 360 Ohm
AL THRESHOLD: 0.5 V
ATRIAL PACING PM: 11.1
BAMS-0001: 170 {beats}/min
BATTERY VOLTAGE: 2.81 V
RV LEAD AMPLITUDE: 6.2 mv
RV LEAD IMPEDENCE PM: 504 Ohm
RV LEAD THRESHOLD: 1.5 V
VENTRICULAR PACING PM: 0.6

## 2011-12-26 NOTE — Assessment & Plan Note (Signed)
His device is working normally but is it reached elective replacement. The voltage is 2.8. He is not pacemaker dependent. Because he has lost his insurance, and because he does not wish to proceed a large go from the hospital, and because he is not pacemaker dependent, and because he has several months of battery longevity, I recommended that we hold off on scheduling pacemaker generator change out. If he is not able to obtain insurance in the next several months, we will attempt to obtain a donated pacemaker for implantation.

## 2011-12-26 NOTE — Patient Instructions (Signed)
Your physician wants you to follow-up in: 6 months with Dr Taylor You will receive a reminder letter in the mail two months in advance. If you don't receive a letter, please call our office to schedule the follow-up appointment.  

## 2011-12-26 NOTE — Progress Notes (Signed)
HPI [name] returns today for followup. He is a very pleasant 41 year old man with a history of symptomatic bradycardia and syncope, status post permanent pacemaker insertion in 2006. He has reached elective replacement. The patient has had no recurrent syncope since his pacemaker was placed. He denies chest pain, shortness of breath, peripheral edema, or additional syncope. He is concerned about his finances as he recently lost his job. No Known Allergies   Current Outpatient Prescriptions  Medication Sig Dispense Refill  . aspirin 81 MG tablet Take 81 mg by mouth daily.        Marland Kitchen omeprazole (PRILOSEC) 20 MG capsule Take 2 capsules (40 mg total) by mouth daily.  60 capsule  11  . triamterene-hydrochlorothiazide (DYAZIDE) 50-25 MG per capsule Take 1 capsule by mouth every morning.         Past Medical History  Diagnosis Date  . Hypertension   . Chest pain     normal LHC 2008;  ETT-Echo 8/10: normal  . Obesity   . GERD (gastroesophageal reflux disease)   . Symptomatic bradycardia     s/p pacer in 2006 in [place]    ROS:   All systems reviewed and negative except as noted in the HPI.   Past Surgical History  Procedure Date  . Insert / replace / remove pacemaker     status post pacemaker implant August 2006  . Appendectomy 1994     No family history on file.   History   Social History  . Marital Status: Single    Spouse Name: N/A    Number of Children: N/A  . Years of Education: N/A   Occupational History  . Not on file.   Social History Main Topics  . Smoking status: Never Smoker   . Smokeless tobacco: Not on file   Comment: recreational drugs  . Alcohol Use: Not on file  . Drug Use: Not on file  . Sexually Active: Not on file   Other Topics Concern  . Not on file   Social History Narrative   Tobacco history none. He does admit to using recreational drugs in the past. He has not smoked marijuana in 7 years. He is currently working as a [employer] architect, He  has 6 siblings all in good health. Both of his parents are in there 50 s and are  in good health.     BP 124/90  Pulse 80  Ht 5\' 11"  (1.803 m)  Wt 304 lb 6.4 oz (138.075 kg)  BMI 42.46 kg/m2  Physical Exam:  Well appearing middle-aged man, NAD HEENT: Unremarkable Neck:  No JVD, no thyromegally Lungs:  Clear with no wheezes, rales, or rhonchi. HEART:  Regular rate rhythm, no murmurs, no rubs, no clicks Abd:  soft, obese, positive bowel sounds, no organomegally, no rebound, no guarding Ext:  2 plus pulses, no edema, no cyanosis, no clubbing Skin:  No rashes no nodules Neuro:  CN II through XII intact, motor grossly intact  DEVICE  Normal device function.  See PaceArt for details. Device is at elective replacement.  Assess/Plan:

## 2012-02-19 ENCOUNTER — Telehealth: Payer: Self-pay | Admitting: Internal Medicine

## 2012-02-19 NOTE — Telephone Encounter (Signed)
Still does not have insurance. He has one he thinks will cover his PPM change out so he wanted to schedule his appt in October or November for surgery. I told him we can not schedule that far in advance. I told him when he finds out for sure about insurance to let us know. Per Tresa Endo, I asked Glynda Jaeger to schedule him an appointment in September so he can discuss this further with Dr Ladona Ridgel.

## 2012-02-19 NOTE — Telephone Encounter (Signed)
New  msg Pt wants to talk to you about scheduling surgery. Please call

## 2012-03-28 ENCOUNTER — Encounter: Payer: Self-pay | Admitting: *Deleted

## 2012-03-28 ENCOUNTER — Ambulatory Visit (INDEPENDENT_AMBULATORY_CARE_PROVIDER_SITE_OTHER): Payer: Self-pay | Admitting: Internal Medicine

## 2012-03-28 ENCOUNTER — Encounter: Payer: Self-pay | Admitting: Internal Medicine

## 2012-03-28 VITALS — BP 156/94 | HR 102 | Ht 71.0 in | Wt 297.8 lb

## 2012-03-28 DIAGNOSIS — I498 Other specified cardiac arrhythmias: Secondary | ICD-10-CM | POA: Insufficient documentation

## 2012-03-28 DIAGNOSIS — Z950 Presence of cardiac pacemaker: Secondary | ICD-10-CM

## 2012-03-28 DIAGNOSIS — R55 Syncope and collapse: Secondary | ICD-10-CM

## 2012-03-28 DIAGNOSIS — Z45018 Encounter for adjustment and management of other part of cardiac pacemaker: Secondary | ICD-10-CM

## 2012-03-28 DIAGNOSIS — E669 Obesity, unspecified: Secondary | ICD-10-CM

## 2012-03-28 LAB — PACEMAKER DEVICE OBSERVATION
AL AMPLITUDE: 1.6 mv
ATRIAL PACING PM: 20.8
BAMS-0001: 170 {beats}/min
BATTERY VOLTAGE: 2.81 V
RV LEAD AMPLITUDE: 5.5 mv
RV LEAD IMPEDENCE PM: 520 Ohm
VENTRICULAR PACING PM: 0.3

## 2012-03-28 NOTE — Addendum Note (Signed)
Addended by: Dennis Bast F on: 03/28/2012 09:41 AM   Modules accepted: Orders

## 2012-03-28 NOTE — Assessment & Plan Note (Signed)
Today we discussed the importance of weight loss. He will try to lose weight.

## 2012-03-28 NOTE — Patient Instructions (Signed)
Pacer generator change out  See instruction sheet

## 2012-03-28 NOTE — Assessment & Plan Note (Signed)
His device is at [facility]. He still does not have insurance coverage and at this point, I will attempt to obtain a donated device and proceed with change out. I have discussed the risks/benefits/goals/expectations of the procedure and he wishes to proceed.

## 2012-03-28 NOTE — Progress Notes (Signed)
HPI  No Known Allergies   Current Outpatient Prescriptions  Medication Sig Dispense Refill  . aspirin 81 MG tablet Take 81 mg by mouth daily.        Marland Kitchen omeprazole (PRILOSEC) 20 MG capsule Take 2 capsules (40 mg total) by mouth daily.  60 capsule  11  . triamterene-hydrochlorothiazide (DYAZIDE) 50-25 MG per capsule Take 1 capsule by mouth every morning.         Past Medical History  Diagnosis Date  . Hypertension   . Chest pain     normal LHC 2008;  ETT-Echo 8/10: normal  . Obesity   . GERD (gastroesophageal reflux disease)   . Symptomatic bradycardia     s/p pacer in 2006 in [place]  . Other specified cardiac dysrhythmias     ROS:   All systems reviewed and negative except as noted in the HPI.   Past Surgical History  Procedure Date  . Insert / replace / remove pacemaker     status post pacemaker implant August 2006  . Appendectomy 1994     No family history on file.   History   Social History  . Marital Status: Single    Spouse Name: N/A    Number of Children: N/A  . Years of Education: N/A   Occupational History  . Not on file.   Social History Main Topics  . Smoking status: Never Smoker   . Smokeless tobacco: Not on file   Comment: recreational drugs  . Alcohol Use: Not on file  . Drug Use: Not on file  . Sexually Active: Not on file   Other Topics Concern  . Not on file   Social History Narrative   Tobacco history none. He does admit to using recreational drugs in the past. He has not smoked marijuana in 7 years. 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 Both of his parents are in there 50 s and are  in good health.     BP 156/94  Pulse 102  Ht 5\' 11"  (1.803 m)  Wt 297 lb 12.8 oz (135.081 kg)  BMI 41.53 kg/m2  Physical Exam:  Well appearing NAD HEENT: Unremarkable Neck:  No JVD, no thyromegally Lymphatics:  No adenopathy Back:  No CVA tenderness Lungs:  Clear HEART:  Regular rate rhythm, no  murmurs, no rubs, no clicks Abd:  soft, positive bowel sounds, no organomegally, no rebound, no guarding Ext:  2 plus pulses, no edema, no cyanosis, no clubbing Skin:  No rashes no nodules Neuro:  CN II through XII intact, motor grossly intact   DEVICE  Normal device function.  See PaceArt for details. Device at [facility]  Assess/Plan:

## 2012-04-04 ENCOUNTER — Encounter (HOSPITAL_COMMUNITY): Payer: Self-pay | Admitting: Pharmacist

## 2012-04-11 ENCOUNTER — Ambulatory Visit (INDEPENDENT_AMBULATORY_CARE_PROVIDER_SITE_OTHER): Payer: Self-pay | Admitting: *Deleted

## 2012-04-11 DIAGNOSIS — K219 Gastro-esophageal reflux disease without esophagitis: Secondary | ICD-10-CM

## 2012-04-11 DIAGNOSIS — E669 Obesity, unspecified: Secondary | ICD-10-CM

## 2012-04-11 DIAGNOSIS — I10 Essential (primary) hypertension: Secondary | ICD-10-CM

## 2012-04-11 LAB — CBC WITH DIFFERENTIAL/PLATELET
Basophils Absolute: 0 10*3/uL (ref 0.0–0.1)
Basophils Relative: 0.5 % (ref 0.0–3.0)
Eosinophils Absolute: 0.1 10*3/uL (ref 0.0–0.7)
Hemoglobin: 13.9 g/dL (ref 13.0–17.0)
Lymphocytes Relative: 35 % (ref 12.0–46.0)
Lymphs Abs: 1.9 10*3/uL (ref 0.7–4.0)
MCHC: 33 g/dL (ref 30.0–36.0)
MCV: 85.5 fl (ref 78.0–100.0)
Monocytes Absolute: 0.8 10*3/uL (ref 0.1–1.0)
Neutro Abs: 2.7 10*3/uL (ref 1.4–7.7)
RBC: 4.91 Mil/uL (ref 4.22–5.81)
RDW: 14.1 % (ref 11.5–14.6)

## 2012-04-11 LAB — BASIC METABOLIC PANEL
CO2: 27 mEq/L (ref 19–32)
Calcium: 9.6 mg/dL (ref 8.4–10.5)
Chloride: 105 mEq/L (ref 96–112)
Glucose, Bld: 88 mg/dL (ref 70–99)
Sodium: 138 mEq/L (ref 135–145)

## 2012-04-15 MED ORDER — SODIUM CHLORIDE 0.9 % IR SOLN
80.0000 mg | Status: DC
  Filled 2012-04-15: qty 2

## 2012-04-15 MED ORDER — DEXTROSE 5 % IV SOLN
3.0000 g | INTRAVENOUS | Status: DC
  Filled 2012-04-15: qty 3000

## 2012-04-16 ENCOUNTER — Encounter (HOSPITAL_COMMUNITY)
Admission: RE | Disposition: A | Discharge status: Home / Self Care | Payer: Self-pay | Source: Ambulatory Visit | Attending: Internal Medicine

## 2012-04-16 ENCOUNTER — Ambulatory Visit (HOSPITAL_COMMUNITY)
Admission: RE | Admit: 2012-04-16 | Discharge: 2012-04-16 | Disposition: A | Discharge status: Home / Self Care | Payer: Self-pay | Source: Ambulatory Visit | Attending: Internal Medicine | Admitting: Internal Medicine

## 2012-04-16 ENCOUNTER — Ambulatory Visit (HOSPITAL_COMMUNITY): Payer: Self-pay

## 2012-04-16 DIAGNOSIS — E669 Obesity, unspecified: Secondary | ICD-10-CM | POA: Insufficient documentation

## 2012-04-16 DIAGNOSIS — Z45018 Encounter for adjustment and management of other part of cardiac pacemaker: Secondary | ICD-10-CM | POA: Insufficient documentation

## 2012-04-16 DIAGNOSIS — I10 Essential (primary) hypertension: Secondary | ICD-10-CM | POA: Insufficient documentation

## 2012-04-16 DIAGNOSIS — Z950 Presence of cardiac pacemaker: Secondary | ICD-10-CM

## 2012-04-16 DIAGNOSIS — I498 Other specified cardiac arrhythmias: Secondary | ICD-10-CM

## 2012-04-16 DIAGNOSIS — R55 Syncope and collapse: Secondary | ICD-10-CM

## 2012-04-16 HISTORY — PX: PERMANENT PACEMAKER GENERATOR CHANGE: SHX6022

## 2012-04-16 SURGERY — PERMANENT PACEMAKER GENERATOR CHANGE
Anesthesia: LOCAL

## 2012-04-16 MED ORDER — FENTANYL CITRATE 0.05 MG/ML IJ SOLN: 25.0000 ug | INTRAMUSCULAR | Status: DC | PRN

## 2012-04-16 MED ORDER — MUPIROCIN 2 % EX OINT
TOPICAL_OINTMENT | Freq: Once | CUTANEOUS | Status: AC
  Administered 2012-04-16: 1 via NASAL

## 2012-04-16 MED ORDER — MUPIROCIN 2 % EX OINT
TOPICAL_OINTMENT | CUTANEOUS | Status: AC
  Administered 2012-04-16: 1 via NASAL
  Filled 2012-04-16: qty 22

## 2012-04-16 MED ORDER — ACETAMINOPHEN-CODEINE #3 300-30 MG PO TABS: 1.0000 | ORAL_TABLET | Freq: Four times a day (QID) | ORAL | Status: DC | PRN

## 2012-04-16 MED ORDER — SODIUM CHLORIDE 0.45 % IV SOLN
INTRAVENOUS | Status: DC
  Administered 2012-04-16: 08:00:00 via INTRAVENOUS

## 2012-04-16 MED ORDER — FENTANYL CITRATE 0.05 MG/ML IJ SOLN
INTRAMUSCULAR | Status: AC
  Filled 2012-04-16: qty 2

## 2012-04-16 MED ORDER — SODIUM CHLORIDE 0.9 % IV SOLN: 250.0000 mL | INTRAVENOUS | Status: DC

## 2012-04-16 MED ORDER — MIDAZOLAM HCL 5 MG/5ML IJ SOLN
INTRAMUSCULAR | Status: AC
  Filled 2012-04-16: qty 5

## 2012-04-16 MED ORDER — SODIUM CHLORIDE 0.9 % IJ SOLN: 3.0000 mL | Freq: Two times a day (BID) | INTRAMUSCULAR | Status: DC

## 2012-04-16 MED ORDER — CHLORHEXIDINE GLUCONATE 4 % EX LIQD
60.0000 mL | Freq: Once | CUTANEOUS | Status: AC
  Administered 2012-04-16: 4 via TOPICAL

## 2012-04-16 MED ORDER — ONDANSETRON HCL 4 MG/2ML IJ SOLN: 4.0000 mg | Freq: Four times a day (QID) | INTRAMUSCULAR | Status: DC | PRN

## 2012-04-16 MED ORDER — LIDOCAINE HCL (PF) 1 % IJ SOLN
INTRAMUSCULAR | Status: AC
  Filled 2012-04-16: qty 60

## 2012-04-16 MED ORDER — SODIUM CHLORIDE 0.9 % IJ SOLN: 3.0000 mL | INTRAMUSCULAR | Status: DC | PRN

## 2012-04-16 MED ORDER — ACETAMINOPHEN 325 MG PO TABS: 325.0000 mg | ORAL_TABLET | ORAL | Status: DC | PRN

## 2012-04-16 NOTE — H&P (Signed)
HPI   The patient is a 41 yo man with a h/o symptomatic bradycardia s/p PPM who has reached ERI on his old PPM and now presents for PPM generator change. No fever/chills/night sweats.  No Known Allergies  Current Outpatient Prescriptions   Medication  Sig  Dispense  Refill   .  aspirin 81 MG tablet  Take 81 mg by mouth daily.     Marland Kitchen  omeprazole (PRILOSEC) 20 MG capsule  Take 2 capsules (40 mg total) by mouth daily.  60 capsule  11   .  triamterene-hydrochlorothiazide (DYAZIDE) 50-25 MG per capsule  Take 1 capsule by mouth every morning.      Past Medical History   Diagnosis  Date   .  Hypertension    .  Chest pain      normal LHC 2008; ETT-Echo 8/10: normal   .  Obesity    .  GERD (gastroesophageal reflux disease)    .  Symptomatic bradycardia      s/p pacer in 2006 in [place]   .  Other specified cardiac dysrhythmias     ROS:  All systems reviewed and negative except as noted in the HPI.  Past Surgical History   Procedure  Date   .  Insert / replace / remove pacemaker      status post pacemaker implant August 2006   .  Appendectomy  1994    No family history on file.  History    Social History   .  Marital Status:  Single     Spouse Name:  N/A     Number of Children:  N/A   .  Years of Education:  N/A    Occupational History   .  Not on file.    Social History Main Topics   .  Smoking status:  Never Smoker   .  Smokeless tobacco:  Not on file     Comment: recreational drugs    .  Alcohol Use:  Not on file   .  Drug Use:  Not on file   .  Sexually Active:  Not on file    Other Topics  Concern   .  Not on file    Social History Narrative    Tobacco history none. He does admit to using recreational drugs in the past. He has not smoked marijuana in 7 years. 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 Both of his parents are in there 50 s and are in good health.    BP 156/94  Pulse 102  Ht 5\' 11"  (1.803 m)  Wt 297 lb 12.8  oz (135.081 kg)  BMI 41.53 kg/m2  Physical Exam:  Well appearing NAD  HEENT: Unremarkable  Neck: No JVD, no thyromegally  Lymphatics: No adenopathy  Back: No CVA tenderness  Lungs: Clear  HEART: Regular rate rhythm, no murmurs, no rubs, no clicks  Abd: soft, positive bowel sounds, no organomegally, no rebound, no guarding  Ext: 2 plus pulses, no edema, no cyanosis, no clubbing  Skin: No rashes no nodules  Neuro: CN II through XII intact, motor grossly intact  DEVICE  Normal device function. See PaceArt for details. Device at ERI  Assess/Plan:  Pacemaker - Jim Bunting, MD  His device is at [facility]. He still does not have insurance coverage and at this point, I will attempt to obtain a donated device and proceed with change out. I have discussed the  risks/benefits/goals/expectations of the procedure and he wishes to proceed. Obesity - Jim Bunting, MD 03/28/2012 9:23 AM Signed  Today we discussed the importance of weight loss. He will try to lose weight.

## 2012-04-16 NOTE — Op Note (Signed)
DDD PPM generator removal, pocket revision and insertion of a new device without immediate complication. W#[id number].

## 2012-04-16 NOTE — Progress Notes (Signed)
Called by nurse in Short Stay to address patient's concerns regarding pain after his PPM generator change. He reports Dr. Ladona Ridgel was planning to provide a short course of prescription pain medication. I confirmed with Dr. Ladona Ridgel via phone that Mr. Tison can be given a prescription for Tylenol 3 or its equivalent x 2-3 days. This was provided to Mr. Sennett prior to discharge. If he experiences increasing pain, swelling, warmth or redness at the PPM site, or if he develops fever, he will call our office immediately.

## 2012-04-16 NOTE — Interval H&P Note (Signed)
History and Physical Interval Note:  04/16/2012 10:01 AM  Jim Hill  has presented today for surgery, with the diagnosis of end of life generator  The various methods of treatment have been discussed with the patient and family. After consideration of risks, benefits and other options for treatment, the patient has consented to  Procedure(s) (LRB) with comments: PERMANENT PACEMAKER GENERATOR CHANGE (N/A) as a surgical intervention .  The patient's history has been reviewed, patient examined, no change in status, stable for surgery.  I have reviewed the patient's chart and labs.  Questions were answered to the patient's satisfaction.     [name]

## 2012-04-16 NOTE — Op Note (Signed)
NAMEBEDFORD, Jim Hill NO.:  [id number]  MEDICAL RECORD NO.:  [id number]  LOCATION:  [facility]                         FACILITY:  [facility]  PHYSICIAN:  Doylene Canning. Ladona Ridgel, MD    DATE OF BIRTH:  01/15/1971  DATE OF PROCEDURE:  04/16/2012 DATE OF DISCHARGE:  04/16/2012                              OPERATIVE REPORT   PROCEDURE PERFORMED:  Removal of a previously implanted Medtronic pacemaker and insertion of a new Medtronic pacemaker with pacemaker pocket revision.  INTRODUCTION:  The patient is a 41 year old male with symptomatic bradycardia who underwent permanent pacemaker insertion at an outside institution many years ago.  He has reached elective replacement on his device.  He is now referred for removal of his old device and insertion of a new one.  PROCEDURE:  After informed consent was obtained, the patient was taken to the Diagnostic EP Lab in the fasting state.  After usual preparation and draping, intravenous fentanyl and midazolam was given for sedation. A 30 mL of lidocaine was infiltrated over the old pacemaker insertion site.  A 5-cm incision was carried out over this region.  Electrocautery was utilized to dissect down to the pacemaker pocket.  It was entered with electrocautery and the generator was removed with gentle traction. The leads were freed up from the binding sites.  The leads were evaluated.  The P-waves were 2, the R-waves were 8.  The pacing impedance was 380 in the atrium and 500 in the ventricle.  The threshold was 0.5 at 0.4 in the atrium and 1 at 0.4 on the right ventricle.  At this point to accommodate the slightly larger pacemaker, the pacemaker pocket was revised with electrocautery.  Electrocautery was utilized to assure hemostasis.  The Medtronic Sensia dual-chamber pacemaker, serial number [id number] was connected to the atrial and the RV leads and placed back in the subcutaneous pocket.  The pocket was irrigated with antibiotic  irrigation.  The incision was closed with 2-0 and 3-0 Vicryl. Benzoin and Steri-Strips were painted on the skin, pressure dressing was applied, and the patient was returned to his room in satisfactory condition.  COMPLICATIONS:  There were no immediate procedure complications.  RESULTS:  This demonstrate successful removal of a previously implanted Medtronic dual-chamber pacemaker followed by pacemaker pocket revision to accommodate the larger-shaped pacemaker followed by insertion of a new dual-chamber pacemaker.     Doylene Canning. Ladona Ridgel, MD     GWT/MEDQ  D:  04/16/2012  T:  04/16/2012  Job:  [id number]

## 2012-04-17 ENCOUNTER — Encounter: Payer: Self-pay | Admitting: *Deleted

## 2012-06-11 ENCOUNTER — Encounter (HOSPITAL_COMMUNITY): Payer: Self-pay | Admitting: *Deleted

## 2012-06-11 ENCOUNTER — Emergency Department (HOSPITAL_COMMUNITY)
Admission: EM | Admit: 2012-06-11 | Discharge: 2012-06-12 | Disposition: A | Discharge status: Home / Self Care | Payer: Self-pay | Attending: Emergency Medicine | Admitting: Emergency Medicine

## 2012-06-11 ENCOUNTER — Emergency Department (HOSPITAL_COMMUNITY): Payer: Self-pay

## 2012-06-11 DIAGNOSIS — E669 Obesity, unspecified: Secondary | ICD-10-CM | POA: Insufficient documentation

## 2012-06-11 DIAGNOSIS — K219 Gastro-esophageal reflux disease without esophagitis: Secondary | ICD-10-CM | POA: Insufficient documentation

## 2012-06-11 DIAGNOSIS — M25512 Pain in left shoulder: Secondary | ICD-10-CM

## 2012-06-11 DIAGNOSIS — M25519 Pain in unspecified shoulder: Secondary | ICD-10-CM | POA: Insufficient documentation

## 2012-06-11 DIAGNOSIS — I10 Essential (primary) hypertension: Secondary | ICD-10-CM | POA: Insufficient documentation

## 2012-06-11 DIAGNOSIS — R209 Unspecified disturbances of skin sensation: Secondary | ICD-10-CM | POA: Insufficient documentation

## 2012-06-11 DIAGNOSIS — I498 Other specified cardiac arrhythmias: Secondary | ICD-10-CM | POA: Insufficient documentation

## 2012-06-11 DIAGNOSIS — Z79899 Other long term (current) drug therapy: Secondary | ICD-10-CM | POA: Insufficient documentation

## 2012-06-11 DIAGNOSIS — Z7982 Long term (current) use of aspirin: Secondary | ICD-10-CM | POA: Insufficient documentation

## 2012-06-11 NOTE — ED Notes (Signed)
Pt had left shoulder surgery at [facility], dr taylor, on 05/06/12, c/o pain since sugery, also says his arm will go numb off and on, sts today the numbness did not go away and the pain is getting worse

## 2012-06-11 NOTE — ED Provider Notes (Signed)
MSE was initiated and I personally evaluated the patient and placed orders (if any) at  10:41 PM on June 11, 2012.  The patient appears stable so that the remainder of the MSE may be completed by another provider.  41 year old male who pacemaker placed by Dr. Ladona Ridgel on October 25 presents the emergency department complaining of and aching left shoulder on and off with associated chest pain since having surgery. He did not bring this up at his postop visit to see that it could have been his arthritis. He try taking Tylenol without any relief. He describes the pain as constant but occasionally getting worse at random. Rates pain 5/10. Admits to waking up in the middle of the night with this pain associated with numbness and tingling down his left arm. Admits to associated "hot flashes" with palpitations when the pain comes on.  Trevor Mace, PA-C 06/11/12 [phone]

## 2012-06-11 NOTE — ED Provider Notes (Signed)
Medical screening examination/treatment/procedure(s) were performed by non-physician practitioner and as supervising physician I was immediately available for consultation/collaboration.  Ethelda Chick, MD 06/11/12 [phone]

## 2012-06-11 NOTE — ED Notes (Signed)
Patient transported to X-ray 

## 2012-06-12 LAB — BASIC METABOLIC PANEL
BUN: 15 mg/dL (ref 6–23)
Creatinine, Ser: 1.39 mg/dL — ABNORMAL HIGH (ref 0.50–1.35)
GFR calc Af Amer: 72 mL/min — ABNORMAL LOW (ref >90)
GFR calc non Af Amer: 62 mL/min — ABNORMAL LOW (ref >90)
Potassium: 4 mEq/L (ref 3.5–5.1)

## 2012-06-12 LAB — CBC WITH DIFFERENTIAL/PLATELET
Basophils Absolute: 0 10*3/uL (ref 0.0–0.1)
Basophils Relative: 0 % (ref 0–1)
Eosinophils Absolute: 0.2 10*3/uL (ref 0.0–0.7)
HCT: 39.9 % (ref 39.0–52.0)
MCH: 28 pg (ref 26.0–34.0)
MCHC: 34.1 g/dL (ref 30.0–36.0)
MCV: 82.3 fL (ref 78.0–100.0)
Monocytes Absolute: 0.7 10*3/uL (ref 0.1–1.0)
Monocytes Relative: 10 % (ref 3–12)
Neutro Abs: 3.3 10*3/uL (ref 1.7–7.7)
Neutrophils Relative %: 49 % (ref 43–77)
RBC: 4.85 MIL/uL (ref 4.22–5.81)
RDW: 13.2 % (ref 11.5–15.5)
WBC: 6.8 10*3/uL (ref 4.0–10.5)

## 2012-06-12 LAB — TROPONIN I: Troponin I: <0.3 ng/mL (ref <0.30)

## 2012-06-12 MED ORDER — NAPROXEN 500 MG PO TABS
500.0000 mg | ORAL_TABLET | Freq: Two times a day (BID) | ORAL | Status: DC
  Administered 2012-06-12: 500 mg via ORAL
  Filled 2012-06-12: qty 1

## 2012-06-12 MED ORDER — HYDROCODONE-ACETAMINOPHEN 5-500 MG PO TABS: 1.0000 | ORAL_TABLET | Freq: Four times a day (QID) | ORAL | Status: DC | PRN

## 2012-06-12 MED ORDER — NAPROXEN 500 MG PO TABS: 250.0000 mg | ORAL_TABLET | Freq: Two times a day (BID) | ORAL | Status: DC

## 2012-06-12 NOTE — ED Provider Notes (Signed)
History     CSN: [id number]  Arrival date & time 06/11/12  2106   First MD Initiated Contact with Patient 06/11/12 2126      Chief Complaint  Patient presents with  . Shoulder Pain    (Consider location/radiation/quality/duration/timing/severity/associated sxs/prior treatment) HPI 41 year old male presents to the emergency apartment complaining of ongoing left shoulder pain with some radiation to his chest. He reports symptoms have been ongoing for some time, but has really noticed them over the last 3 weeks. He intermittently has taken BC powders and ibuprofen which has  relieved pain. Patient reports over last few days he has had some hot flashes when the pain comes on and radiation into his chest. Pain is in a band across the top of his left shoulder. Moving the shoulder sometimes relieves the pain. He reports often waking up in the night with numbness going down his left arm. He has never seen an orthopedic physician for these pains. Patient is status post a pacemaker replacement on September 25. He has not yet had followup with his cardiologist. Pacemaker for symptomatic bradycardia. He denies any chest pain at present. He denies any shortness of breath or limitation in activity. No orthopnea, no PND. He does report that he felt like a fish was swimming in his chest 3 days ago. This is described as a fluttering feeling.  Past Medical History  Diagnosis Date  . Hypertension   . Chest pain     normal LHC 2008;  ETT-Echo 8/10: normal  . Obesity   . GERD (gastroesophageal reflux disease)   . Symptomatic bradycardia     s/p pacer in 2006 in [place]  . Other specified cardiac dysrhythmias     Past Surgical History  Procedure Date  . Insert / replace / remove pacemaker     status post pacemaker implant August 2006  . Appendectomy 1994    No family history on file.  History  Substance Use Topics  . Smoking status: Never Smoker   . Smokeless tobacco: Not on file     Comment:  recreational drugs  . Alcohol Use: Yes      Review of Systems  See History of Present Illness; otherwise all other systems are reviewed and negative  Allergies  Review of patient's allergies indicates no known allergies.  Home Medications   Current Outpatient Rx  Name  Route  Sig  Dispense  Refill  . ASPIRIN 81 MG PO CHEW   Oral   Chew 81 mg by mouth daily.         . ADULT MULTIVITAMIN W/MINERALS CH   Oral   Take 1 tablet by mouth daily.         Marland Kitchen OMEPRAZOLE 20 MG PO CPDR   Oral   Take 20 mg by mouth daily as needed. For indigestion         . TRIAMTERENE-HCTZ 37.5-25 MG PO CAPS   Oral   Take 1 capsule by mouth daily.           BP 154/93  Pulse 78  Temp 98.2 F (36.8 C) (Oral)  Resp 20  SpO2 98%  Physical Exam  Nursing note and vitals reviewed. Constitutional: He is oriented to person, place, and time. He appears well-developed and well-nourished. No distress.  HENT:  Head: Normocephalic and atraumatic.  Nose: Nose normal.  Mouth/Throat: Oropharynx is clear and moist.  Eyes: Conjunctivae normal and EOM are normal. Pupils are equal, round, and reactive to light.  Neck: Normal  range of motion. Neck supple. No JVD present. No tracheal deviation present. No thyromegaly present.  Cardiovascular: Normal rate, regular rhythm, normal heart sounds and intact distal pulses.  Exam reveals no gallop and no friction rub.   No murmur heard. Pulmonary/Chest: Effort normal and breath sounds normal. No stridor. No respiratory distress. He has no wheezes. He has no rales. He exhibits no tenderness.  Abdominal: Soft. Bowel sounds are normal. He exhibits no distension and no mass. There is no tenderness. There is no rebound and no guarding.  Musculoskeletal: Normal range of motion. He exhibits tenderness (tenderness of palpation of her rotator cuff. Some pain with range of motion of the left shoulder). He exhibits no edema.  Lymphadenopathy:    He has no cervical  adenopathy.  Neurological: He is alert and oriented to person, place, and time. He exhibits normal muscle tone. Coordination normal.  Skin: Skin is warm and dry. No rash noted. He is not diaphoretic. No erythema. No pallor.  Psychiatric: He has a normal mood and affect. His behavior is normal. Judgment and thought content normal.    ED Course  Procedures (including critical care time)  Labs Reviewed  BASIC METABOLIC PANEL - Abnormal; Notable for the following:    Creatinine, Ser 1.39 (*)     GFR calc non Af Amer 62 (*)     GFR calc Af Amer 72 (*)     All other components within normal limits  CBC WITH DIFFERENTIAL  TROPONIN I  PRO B NATRIURETIC PEPTIDE   Dg Chest 2 View  06/11/2012  *RADIOLOGY REPORT*  Clinical Data:  Pacemaker, left shoulder pain, left chest pain  CHEST - 2 VIEW  Comparison: 04/16/2012  Findings: Left subclavian sequential transvenous pacemaker leads project at right atrium and right ventricle. Enlargement of cardiac silhouette. Slight pulmonary vascular congestion. Mediastinal contours normal. No acute infiltrate, pleural effusion or pneumothorax. Bones unremarkable.  IMPRESSION: Enlargement of cardiac silhouette with pulmonary vascular congestion and pacemaker placement. No acute abnormalities.   Original Report Authenticated By: Ulyses Southward, M.D.    Dg Shoulder Left  06/11/2012  *RADIOLOGY REPORT*  Clinical Data: Left chest and shoulder pain, pacemaker placed on 05/16/2012  LEFT SHOULDER - 2+ VIEW  Comparison: None  Findings: Osseous mineralization normal. Pacemaker generator obscures portions of the scapula and left ribs on AP views. AC joint alignment normal. Spurring at dorsal margin of the acromion. No acute fracture, dislocation, or bone destruction. Visualized left ribs appear intact.  IMPRESSION: No acute osseous abnormalities.   Original Report Authenticated By: Ulyses Southward, M.D.     Date: 06/12/2012  Rate: 82  Rhythm: normal sinus rhythm  QRS Axis: normal   Intervals: normal  ST/T Wave abnormalities: nonspecific T wave changes  Conduction Disutrbances:none  Narrative Interpretation:   Old EKG Reviewed: unchanged   1. Shoulder pain, left       MDM  41 year old male with history of pacemaker due to bradycardia who has been having ongoing left shoulder pain for weeks to months. Some radiation into his chest. Patient convinced come to the emergency part tonight by his wife. I feel symptoms are most likely due to to rotator cuff pathology. Given findings on chest x-ray we'll check BNP for possible occult CHF, although I feel this is less likely. Given chest pain will check a troponin. Will encourage him followup with his primary care doctor as well as orthopedics.        Olivia Mackie, MD 06/12/12 [phone]

## 2012-09-22 ENCOUNTER — Encounter: Payer: Self-pay | Admitting: Physician Assistant

## 2012-10-03 ENCOUNTER — Ambulatory Visit (INDEPENDENT_AMBULATORY_CARE_PROVIDER_SITE_OTHER): Payer: Self-pay | Admitting: Cardiology

## 2012-10-03 ENCOUNTER — Encounter: Payer: Self-pay | Admitting: Physician Assistant

## 2012-10-03 ENCOUNTER — Encounter: Payer: Self-pay | Admitting: Cardiology

## 2012-10-03 ENCOUNTER — Encounter: Payer: Self-pay | Admitting: Internal Medicine

## 2012-10-03 VITALS — BP 128/88 | HR 77 | Ht 71.0 in | Wt 303.0 lb

## 2012-10-03 DIAGNOSIS — Z950 Presence of cardiac pacemaker: Secondary | ICD-10-CM

## 2012-10-03 DIAGNOSIS — I498 Other specified cardiac arrhythmias: Secondary | ICD-10-CM

## 2012-10-03 DIAGNOSIS — R001 Bradycardia, unspecified: Secondary | ICD-10-CM

## 2012-10-03 LAB — PACEMAKER DEVICE OBSERVATION
AL AMPLITUDE: 2.8 mv
AL IMPEDENCE PM: 381 Ohm
ATRIAL PACING PM: 8.7
BATTERY VOLTAGE: 2.79 V
RV LEAD IMPEDENCE PM: 555 Ohm
VENTRICULAR PACING PM: 0.2

## 2012-10-03 NOTE — Patient Instructions (Addendum)
Your physician recommends that you schedule a follow-up appointment in: 6 months with [facility] Your physician wants you to follow-up in: 1 year Dr Ladona Ridgel. You will receive a reminder letter in the mail two months in advance. If you don't receive a letter, please call our office to schedule the follow-up appointment.

## 2012-10-07 ENCOUNTER — Encounter: Payer: Self-pay | Admitting: Cardiology

## 2012-10-07 NOTE — Progress Notes (Signed)
ELECTROPHYSIOLOGY OFFICE NOTE  Patient ID: Jim Hill MRN: [id number], DOB/AGE: 1971-03-19   Date of Visit: 10/07/2012  Primary Physician: Renford Dills, MD Primary Cardiologist: [name], MD Reason for Visit: EP/device follow-up  History of Present Illness  Jim Hill is a 42 year old man with symptomatic bradycardia s/p PPM implant in [place], [place] in 2006 and recent PPM generator change Sept 2013 who presents today for routine electrophysiology followup. Since last being seen in our clinic, he reports he is doing well. His only concern today is itching around his incision at the [facility] implant site. He denies pain, redness, swelling, warmth, bleeding/discharge, fever or chills. Today, he denies chest pain or shortness of breath. He denies palpitations, dizziness, near syncope or syncope. He denies LE swelling, orthopnea, PND or recent weight gain. Jim Hill states that he is compliant and tolerating medications without difficulty.  Past Medical History Past Medical History  Diagnosis Date  . Hypertension   . Chest pain     normal LHC 2008;  ETT-Echo 8/10: normal  . Obesity   . GERD (gastroesophageal reflux disease)   . Symptomatic bradycardia     s/p pacer in 2006 in [place]  . Other specified cardiac dysrhythmias     Past Surgical History Past Surgical History  Procedure Laterality Date  . Insert / replace / remove pacemaker      status post pacemaker implant August 2006  . Appendectomy  1994     Allergies/Intolerances No Known Allergies  Current Home Medications Current Outpatient Prescriptions  Medication Sig Dispense Refill  . aspirin 81 MG chewable tablet Chew 81 mg by mouth daily.      . Multiple Vitamin (MULTIVITAMIN WITH MINERALS) TABS Take 1 tablet by mouth daily.      . naproxen (NAPROSYN) 500 MG tablet Take 0.5 tablets (250 mg total) by mouth 2 (two) times daily.  30 tablet  0  . omeprazole (PRILOSEC) 20 MG capsule Take 20 mg by mouth daily as  needed. For indigestion      . triamterene-hydrochlorothiazide (DYAZIDE) 37.5-25 MG per capsule Take 1 capsule by mouth daily.       No current facility-administered medications for this visit.    Social History Social History  . Marital Status: Single   Social History Main Topics  . Smoking status: Never Smoker   . Smokeless tobacco: Not on file  . Alcohol Use: Yes  . Drug Use: No   Social History Narrative   Tobacco history none. He does admit to using recreational drugs in the past. He has not smoked marijuana in 7 years. 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 Both of his parents are in there 50 s and are  in good health.    Review of Systems General: No chills, fever, night sweats or weight changes Cardiovascular: No chest pain, dyspnea on exertion, edema, orthopnea, palpitations, paroxysmal nocturnal dyspnea Dermatological: No rash, lesions or masses Respiratory: No cough, dyspnea Urologic: No hematuria, dysuria Abdominal: No nausea, vomiting, diarrhea, bright red blood per rectum, melena, or hematemesis Neurologic: No visual changes, weakness, changes in mental status All other systems reviewed and are otherwise negative except as noted above.  Physical Exam Blood pressure 128/88, pulse 77, height 5\' 11"  (1.803 m), weight 303 lb (137.44 kg), SpO2 97.00%.  General: Well developed, well appearing 42 year old male in no acute distress. HEENT: Normocephalic, atraumatic. EOMs intact. Sclera nonicteric. Oropharynx clear.  Neck: Supple. No  JVD. Lungs: Respirations regular and unlabored, CTA bilaterally. No wheezes, rales or rhonchi. Heart: RRR. S1, S2 present. No murmurs, rub, S3 or S4. Abdomen: Soft, non-distended.  Extremities: No clubbing, cyanosis or edema. DP/PT/radials 2+ and equal bilaterally. Psych: Normal affect. Neuro: Alert and oriented X 3. Moves all extremities spontaneously.   Diagnostics Device interrogation today shows  normal PPM function with good battery status and stable lead measurements/parameters; no programming changes made; see PaceArt report  Assessment and Plan 1. Symptomatic bradycardia s/p PPM implant 2006, recent generator change Sept 2013 Normal PPM function No programming changes made See PaceArt report Return for device clinic follow-up in 6 months Return for follow-up with Dr. Ladona Ridgel in one year   Signed, Rick Duff, PA-C 10/07/2012, 12:48 PM

## 2013-03-24 LAB — HM COLONOSCOPY: HM Colonoscopy: NORMAL

## 2013-04-16 ENCOUNTER — Ambulatory Visit (INDEPENDENT_AMBULATORY_CARE_PROVIDER_SITE_OTHER): Payer: Self-pay | Admitting: *Deleted

## 2013-04-16 DIAGNOSIS — I498 Other specified cardiac arrhythmias: Secondary | ICD-10-CM

## 2013-04-16 LAB — PACEMAKER DEVICE OBSERVATION
AL THRESHOLD: 0.75 V
ATRIAL PACING PM: 10
BAMS-0001: 175 {beats}/min
RV LEAD IMPEDENCE PM: 542 Ohm
RV LEAD THRESHOLD: 1 V

## 2013-04-16 NOTE — Progress Notes (Signed)
Pacemaker check in clinic. Normal device function. Thresholds, sensing, impedances consistent with previous measurements. Device programmed to maximize longevity. 1 mode switch, <1 minute.  No high ventricular rates noted. Device programmed at appropriate safety margins. Histogram distribution appropriate for patient activity level. Device programmed to optimize intrinsic conduction. Estimated longevity 13 years.  Patient education completed.  ROV 6 months with Dr. Johney Frame.

## 2013-04-22 ENCOUNTER — Emergency Department (HOSPITAL_COMMUNITY)
Admission: EM | Admit: 2013-04-22 | Discharge: 2013-04-22 | Disposition: A | Discharge status: Home / Self Care | Payer: Self-pay | Attending: Emergency Medicine | Admitting: Emergency Medicine

## 2013-04-22 ENCOUNTER — Encounter (HOSPITAL_COMMUNITY): Payer: Self-pay | Admitting: Emergency Medicine

## 2013-04-22 DIAGNOSIS — K219 Gastro-esophageal reflux disease without esophagitis: Secondary | ICD-10-CM | POA: Insufficient documentation

## 2013-04-22 DIAGNOSIS — E669 Obesity, unspecified: Secondary | ICD-10-CM | POA: Insufficient documentation

## 2013-04-22 DIAGNOSIS — K625 Hemorrhage of anus and rectum: Secondary | ICD-10-CM | POA: Insufficient documentation

## 2013-04-22 DIAGNOSIS — Z79899 Other long term (current) drug therapy: Secondary | ICD-10-CM | POA: Insufficient documentation

## 2013-04-22 DIAGNOSIS — R109 Unspecified abdominal pain: Secondary | ICD-10-CM | POA: Insufficient documentation

## 2013-04-22 DIAGNOSIS — Z7982 Long term (current) use of aspirin: Secondary | ICD-10-CM | POA: Insufficient documentation

## 2013-04-22 DIAGNOSIS — Z950 Presence of cardiac pacemaker: Secondary | ICD-10-CM | POA: Insufficient documentation

## 2013-04-22 DIAGNOSIS — K921 Melena: Secondary | ICD-10-CM | POA: Insufficient documentation

## 2013-04-22 DIAGNOSIS — I10 Essential (primary) hypertension: Secondary | ICD-10-CM | POA: Insufficient documentation

## 2013-04-22 LAB — OCCULT BLOOD, POC DEVICE: Fecal Occult Bld: NEGATIVE

## 2013-04-22 LAB — POCT I-STAT, CHEM 8
BUN: 14 mg/dL (ref 6–23)
Chloride: 104 mEq/L (ref 96–112)
Creatinine, Ser: 1.6 mg/dL — ABNORMAL HIGH (ref 0.50–1.35)
Hemoglobin: 15 g/dL (ref 13.0–17.0)
Potassium: 4 mEq/L (ref 3.5–5.1)
Sodium: 139 mEq/L (ref 135–145)
TCO2: 23 mmol/L (ref 0–100)

## 2013-04-22 LAB — CBC
MCV: 82.6 fL (ref 78.0–100.0)
Platelets: 236 10*3/uL (ref 150–400)
RDW: 13.4 % (ref 11.5–15.5)
WBC: 7 10*3/uL (ref 4.0–10.5)

## 2013-04-22 LAB — COMPREHENSIVE METABOLIC PANEL
ALT: 31 U/L (ref 0–53)
AST: 32 U/L (ref 0–37)
Albumin: 4 g/dL (ref 3.5–5.2)
Chloride: 99 mEq/L (ref 96–112)
Creatinine, Ser: 1.31 mg/dL (ref 0.50–1.35)
Sodium: 135 mEq/L (ref 135–145)
Total Bilirubin: 0.6 mg/dL (ref 0.3–1.2)

## 2013-04-22 LAB — URINALYSIS, ROUTINE W REFLEX MICROSCOPIC
Bilirubin Urine: NEGATIVE
Glucose, UA: NEGATIVE mg/dL
Hgb urine dipstick: NEGATIVE
Protein, ur: NEGATIVE mg/dL

## 2013-04-22 LAB — TYPE AND SCREEN
ABO/RH(D): O POS
Antibody Screen: NEGATIVE

## 2013-04-22 LAB — ABO/RH: ABO/RH(D): O POS

## 2013-04-22 MED ORDER — PANTOPRAZOLE SODIUM 40 MG IV SOLR
40.0000 mg | Freq: Once | INTRAVENOUS | Status: AC
  Administered 2013-04-22: 40 mg via INTRAVENOUS
  Filled 2013-04-22: qty 40

## 2013-04-22 MED ORDER — HYDROCODONE-ACETAMINOPHEN 5-325 MG PO TABS: 1.0000 | ORAL_TABLET | Freq: Four times a day (QID) | ORAL | Status: DC | PRN

## 2013-04-22 MED ORDER — SODIUM CHLORIDE 0.9 % IV SOLN
INTRAVENOUS | Status: DC
  Administered 2013-04-22: 06:00:00 via INTRAVENOUS

## 2013-04-22 NOTE — ED Provider Notes (Signed)
CSN: [id number]     Arrival date & time 04/22/13  0421 History   First MD Initiated Contact with Patient 04/22/13 [phone]     Chief Complaint  Patient presents with  . Abdominal Pain  . Rectal Bleeding   (Consider location/radiation/quality/duration/timing/severity/associated sxs/prior Treatment) HPI History provided by patient. About 5 weeks ago had an episode of abdominal cramping lasting a matter of minutes and then same day had some blood in his stool. Noted bright red blood. The following day had some dark stool. He has not had any since that time. Over these last 5 weeks has intermittently had some return of abdominal cramping, typically lasts a few minutes and resolves. This had been going on every few days or so. No known aggravating factors. Today he had 2 episodes of this abdominal cramping lasting about 5 minutes. He became worried and presents here for evaluation. No fevers or chills. No nausea or vomiting. No known sick contacts. No recent travel. Currently pain-free in the emergency department. Past Medical History  Diagnosis Date  . Hypertension   . Chest pain     normal LHC 2008;  ETT-Echo 8/10: normal  . Obesity   . GERD (gastroesophageal reflux disease)   . Symptomatic bradycardia     s/p pacer in 2006 in [place]  . Other specified cardiac dysrhythmias(427.89)    Past Surgical History  Procedure Laterality Date  . Insert / replace / remove pacemaker      status post pacemaker implant August 2006  . Appendectomy  1994   Family History  Problem Relation Age of Onset  . Hypertension Other   . Diabetes Other   . Cancer Other   . CAD Other    History  Substance Use Topics  . Smoking status: Never Smoker   . Smokeless tobacco: Not on file  . Alcohol Use: Yes     Comment: occ    Review of Systems  Constitutional: Negative for fever and chills.  HENT: Negative for neck pain and neck stiffness.   Eyes: Negative for pain.  Respiratory: Negative for shortness of  breath.   Cardiovascular: Negative for chest pain.  Gastrointestinal: Positive for abdominal pain. Negative for vomiting.  Genitourinary: Negative for dysuria.  Musculoskeletal: Negative for back pain.  Skin: Negative for rash.  Neurological: Negative for headaches.  All other systems reviewed and are negative.    Allergies  Review of patient's allergies indicates no known allergies.  Home Medications   Current Outpatient Rx  Name  Route  Sig  Dispense  Refill  . aspirin 81 MG chewable tablet   Oral   Chew 81 mg by mouth daily.         . Multiple Vitamin (MULTIVITAMIN WITH MINERALS) TABS   Oral   Take 1 tablet by mouth daily.         Marland Kitchen omeprazole (PRILOSEC) 20 MG capsule   Oral   Take 20 mg by mouth daily as needed. For indigestion         . triamterene-hydrochlorothiazide (DYAZIDE) 37.5-25 MG per capsule   Oral   Take 1 capsule by mouth daily.          BP 135/118  Pulse 86  Temp(Src) 98.3 F (36.8 C) (Oral)  Resp 18  Ht 5\' 11"  (1.803 m)  Wt 298 lb (135.172 kg)  BMI 41.58 kg/m2  SpO2 97% Physical Exam  Constitutional: He is oriented to person, place, and time. He appears well-developed and well-nourished.  HENT:  Head:  Normocephalic and atraumatic.  Eyes: EOM are normal. Pupils are equal, round, and reactive to light. No scleral icterus.  Neck: Neck supple.  Cardiovascular: Normal rate, regular rhythm and intact distal pulses.   Pulmonary/Chest: Effort normal and breath sounds normal. No respiratory distress.  Abdominal: Soft. Bowel sounds are normal. He exhibits no distension. There is no tenderness. There is no rebound and no guarding.  Genitourinary:  Rectal exam: Nontender, brown stool  Musculoskeletal: Normal range of motion. He exhibits no edema.  Neurological: He is alert and oriented to person, place, and time.  Skin: Skin is warm and dry.    ED Course  Procedures (including critical care time)  Results for orders placed during the  hospital encounter of 04/22/13  CBC      Result Value Range   WBC 7.0  4.0 - 10.5 K/uL   RBC 5.07  4.22 - 5.81 MIL/uL   Hemoglobin 14.1  13.0 - 17.0 g/dL   HCT 16.1  09.6 - 04.5 %   MCV 82.6  78.0 - 100.0 fL   MCH 27.8  26.0 - 34.0 pg   MCHC 33.7  30.0 - 36.0 g/dL   RDW 40.9  81.1 - 91.4 %   Platelets 236  150 - 400 K/uL  COMPREHENSIVE METABOLIC PANEL      Result Value Range   Sodium 135  135 - 145 mEq/L   Potassium 4.1  3.5 - 5.1 mEq/L   Chloride 99  96 - 112 mEq/L   CO2 24  19 - 32 mEq/L   Glucose, Bld 101 (*) 70 - 99 mg/dL   BUN 14  6 - 23 mg/dL   Creatinine, Ser 7.82  0.50 - 1.35 mg/dL   Calcium 9.7  8.4 - 95.6 mg/dL   Total Protein 8.0  6.0 - 8.3 g/dL   Albumin 4.0  3.5 - 5.2 g/dL   AST 32  0 - 37 U/L   ALT 31  0 - 53 U/L   Alkaline Phosphatase 75  39 - 117 U/L   Total Bilirubin 0.6  0.3 - 1.2 mg/dL   GFR calc non Af Amer 66 (*) >90 mL/min   GFR calc Af Amer 77 (*) >90 mL/min  URINALYSIS, ROUTINE W REFLEX MICROSCOPIC      Result Value Range   Color, Urine YELLOW  YELLOW   APPearance CLEAR  CLEAR   Specific Gravity, Urine 1.017  1.005 - 1.030   pH 6.0  5.0 - 8.0   Glucose, UA NEGATIVE  NEGATIVE mg/dL   Hgb urine dipstick NEGATIVE  NEGATIVE   Bilirubin Urine NEGATIVE  NEGATIVE   Ketones, ur NEGATIVE  NEGATIVE mg/dL   Protein, ur NEGATIVE  NEGATIVE mg/dL   Urobilinogen, UA 0.2  0.0 - 1.0 mg/dL   Nitrite NEGATIVE  NEGATIVE   Leukocytes, UA NEGATIVE  NEGATIVE  OCCULT BLOOD, POC DEVICE      Result Value Range   Fecal Occult Bld NEGATIVE  NEGATIVE  POCT I-STAT, CHEM 8      Result Value Range   Sodium 139  135 - 145 mEq/L   Potassium 4.0  3.5 - 5.1 mEq/L   Chloride 104  96 - 112 mEq/L   BUN 14  6 - 23 mg/dL   Creatinine, Ser 2.13 (*) 0.50 - 1.35 mg/dL   Glucose, Bld 086 (*) 70 - 99 mg/dL   Calcium, Ion 5.78  4.69 - 1.23 mmol/L   TCO2 23  0 - 100 mmol/L  Hemoglobin 15.0  13.0 - 17.0 g/dL   HCT 16.1  09.6 - 04.5 %  TYPE AND SCREEN      Result Value Range    ABO/RH(D) O POS     Antibody Screen NEG     Sample Expiration 04/25/2013    ABO/RH      Result Value Range   ABO/RH(D) O POS     For history of GERD, Protonix provided.  Repeat abdominal exam remains benign, soft nontender nondistended. No symptoms in emergency department. Labs reviewed with patient as above. No anemia. guaiac negative.  Patient is comfortable with plan discharge home and GI followup given blood in stools.  Prescription for hydrocodone provided as needed. Abdominal pain precautions provided and he verbalized them as understood. No indication for emergent imaging at this time.  MDM  Diagnoses: Abdominal cramping   Labs reviewed as above. Medications provided. Vital signs and nursing notes reviewed and considered   Sunnie Nielsen, MD 04/24/13 [phone]

## 2013-04-22 NOTE — ED Notes (Signed)
Pt states he is having abd pain and had one stool tonight that was dark in color and the next one was bloody  The pain originally started about 5 weeks ago  Pt states it started out only like twice a week and has become more frequent since Pt states he has been passing small amts over the past 5 weeks

## 2013-04-25 ENCOUNTER — Encounter: Payer: Self-pay | Admitting: Internal Medicine

## 2013-10-30 ENCOUNTER — Encounter: Payer: Self-pay | Admitting: *Deleted

## 2013-12-25 ENCOUNTER — Encounter: Payer: Self-pay | Admitting: Cardiology

## 2013-12-28 ENCOUNTER — Telehealth: Payer: Self-pay | Admitting: Internal Medicine

## 2013-12-28 NOTE — Telephone Encounter (Signed)
12-28-13 certified letter sent regarding pt's past due device check/mt ° °

## 2014-03-01 ENCOUNTER — Encounter: Payer: Self-pay | Admitting: Internal Medicine

## 2014-03-02 ENCOUNTER — Telehealth: Payer: Self-pay | Admitting: Internal Medicine

## 2014-03-02 NOTE — Telephone Encounter (Signed)
New Message  Pt requests a call back to discuss receiving a transmitter.Marland Kitchen. [place] and he believes this will help. Please call back to discuss.

## 2014-03-03 NOTE — Telephone Encounter (Signed)
Confirmed address with pt for [facility] monitor. I informed patient that he would still need to be seen in the ofc for testing of the leads, etc. Pt voiced understanding and asked when he could come in to see Dr.Taylor bc "he didn't want the monitor anyway". [facility].

## 2014-06-07 ENCOUNTER — Encounter: Payer: Self-pay | Admitting: Internal Medicine

## 2014-06-14 ENCOUNTER — Telehealth: Payer: Self-pay | Admitting: Internal Medicine

## 2014-06-14 NOTE — Telephone Encounter (Signed)
PT LIVES IN [place] NOW, HAVING SOME TROUBLE WITH [employer], HAD DEVICE CHECKED AND IT WAS FINE, PLS ADVISE

## 2014-06-14 NOTE — Telephone Encounter (Signed)
Spoke with patient and he is in the hospital now.  His device is functioning normal per the patient.  He has been having these symptoms for about a month but feels very comfortable with Dr. Ladona Ridgelaylor and wanted him to look over all his records.  I explained to him that "numbness" per say did not have to do with his pacemaker and he should follow up with his PCP but I would ask Dr Ladona Ridgelaylor tomorrow

## 2014-07-01 ENCOUNTER — Encounter (HOSPITAL_COMMUNITY): Payer: Self-pay | Admitting: Internal Medicine

## 2014-07-06 ENCOUNTER — Ambulatory Visit (INDEPENDENT_AMBULATORY_CARE_PROVIDER_SITE_OTHER): Payer: BC Managed Care – PPO | Admitting: Internal Medicine

## 2014-07-06 ENCOUNTER — Encounter: Payer: Self-pay | Admitting: *Deleted

## 2014-07-06 ENCOUNTER — Encounter: Payer: Self-pay | Admitting: Internal Medicine

## 2014-07-06 DIAGNOSIS — Z950 Presence of cardiac pacemaker: Secondary | ICD-10-CM | POA: Diagnosis not present

## 2014-07-06 DIAGNOSIS — I495 Sick sinus syndrome: Secondary | ICD-10-CM | POA: Diagnosis not present

## 2014-07-06 DIAGNOSIS — I5032 Chronic diastolic (congestive) heart failure: Secondary | ICD-10-CM | POA: Insufficient documentation

## 2014-07-06 DIAGNOSIS — R002 Palpitations: Secondary | ICD-10-CM | POA: Diagnosis not present

## 2014-07-06 LAB — MDC_IDC_ENUM_SESS_TYPE_INCLINIC
Battery Impedance: 149 Ohm
Battery Remaining Longevity: 141 mo
Battery Voltage: 2.78 V
Brady Statistic AP VP Percent: 1 %
Brady Statistic AS VP Percent: 0 %
Date Time Interrogation Session: 20151215100705
Lead Channel Pacing Threshold Amplitude: 0.5 V
Lead Channel Pacing Threshold Amplitude: 1 V
Lead Channel Pacing Threshold Pulse Width: 0.4 ms
Lead Channel Pacing Threshold Pulse Width: 0.4 ms
Lead Channel Sensing Intrinsic Amplitude: 2 mV
Lead Channel Sensing Intrinsic Amplitude: 5.6 mV
Lead Channel Setting Pacing Amplitude: 2.25 V
Lead Channel Setting Pacing Pulse Width: 0.4 ms
Lead Channel Setting Sensing Sensitivity: 2 mV
MDC IDC MSMT LEADCHNL RA IMPEDANCE VALUE: 399 Ohm
MDC IDC MSMT LEADCHNL RV IMPEDANCE VALUE: 560 Ohm
MDC IDC SET LEADCHNL RA PACING AMPLITUDE: 1.5 V
MDC IDC STAT BRADY AP VS PERCENT: 25 %
MDC IDC STAT BRADY AS VS PERCENT: 74 %

## 2014-07-06 MED ORDER — FUROSEMIDE 40 MG PO TABS: ORAL_TABLET | ORAL | Status: DC

## 2014-07-06 MED ORDER — FUROSEMIDE 40 MG PO TABS: 40.0000 mg | ORAL_TABLET | Freq: Every day | ORAL | Status: DC

## 2014-07-06 NOTE — Progress Notes (Signed)
HPI [name] returns today for follow-up after a long absence from our arrhythmia clinic. He is a very pleasant 43 year old man with a history of symptomatic bradycardia, status post permanent pacemaker insertion in 2006 in [place]. I suspect he has autonomic dysfunction but this is not well documented. He underwent generator change in 2013. In the interim, he has developed problems with shortness of breath and occasional palpitations. He describes what sounds like atrial fibrillation with a rapid ventricular response. We do not have records as his care has been delivered in [place] at multiple different hospitals. He has been told that his heart rate is as high as 175 bpm. His symptoms come on suddenly. He could also have SVT. His biggest complaint however if shortness of breath, with PND and orthopnea. He admits to dietary indiscretion. He is only on a low dose, weak, diuretic. He denies anginal symptoms. He has some lower extremity swelling. He has gained over 100 pounds in the last 20 years. No Known Allergies   Current Outpatient Prescriptions  Medication Sig Dispense Refill  . aspirin 81 MG chewable tablet Chew 81 mg by mouth daily.    . cyclobenzaprine (FLEXERIL) 5 MG tablet Take 5 mg by mouth 3 (three) times daily as needed for muscle spasms.    Marland Kitchen. NEXIUM 40 MG capsule Take 1 tablet by mouth daily.    Marland Kitchen. triamterene-hydrochlorothiazide (DYAZIDE) 37.5-25 MG per capsule Take 1 capsule by mouth daily.     No current facility-administered medications for this visit.     Past Medical History  Diagnosis Date  . Hypertension   . Chest pain     normal LHC 2008;  ETT-Echo 8/10: normal  . Obesity   . GERD (gastroesophageal reflux disease)   . Symptomatic bradycardia     s/p pacer in 2006 in [place]  . Other specified cardiac dysrhythmias(427.89)     ROS:   All systems reviewed and negative except as noted in the HPI.   Past Surgical History  Procedure  Laterality Date  . Insert / replace / remove pacemaker      status post pacemaker implant August 2006  . Appendectomy  1994  . Permanent pacemaker generator change N/A 04/16/2012    Procedure: PERMANENT PACEMAKER GENERATOR CHANGE;  Surgeon: Marinus MawGregg W Taylor, MD;  [facility];  Service: Cardiovascular;  Laterality: N/A;     Family History  Problem Relation Age of Onset  . Hypertension Other   . Diabetes Other   . Cancer Other   . CAD Other      History   Social History  . Marital Status: Single    Spouse Name: N/A    Number of Children: N/A  . Years of Education: N/A   Occupational History  . Not on file.   Social History Main Topics  . Smoking status: Never Smoker   . Smokeless tobacco: Not on file  . Alcohol Use: Yes     Comment: occ  . Drug Use: No  . Sexual Activity: Yes   Other Topics Concern  . Not on file   Social History Narrative   Tobacco history none. He does admit to using recreational drugs in the past. He has not smoked marijuana in 7 years. 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 Both of his parents are in there 50 s and are  in good health.     BP 134/86  mmHg  Pulse 86  Ht 5\' 11"  (1.803 m)  Wt 292 lb 12.8 oz (132.813 kg)  BMI 40.86 kg/m2  Physical Exam:  Well appearing 43 year old man, NAD HEENT: Unremarkable Neck:  No JVD, no thyromegally Lymphatics:  No adenopathy Back:  No CVA tenderness Lungs:  Clear, with no wheezes, rales, or rhonchi. HEART:  Regular rate rhythm, no murmurs, no rubs, no clicks Abd:  soft, positive bowel sounds, no organomegally, no rebound, no guarding Ext:  2 plus pulses, no edema, no cyanosis, no clubbing Skin:  No rashes no nodules Neuro:  CN II through XII intact, motor grossly intact  EKG - normal sinus rhythm with prior inferior MI and intermittent atrial pacing  DEVICE  Normal device function.  See PaceArt for details.   Assess/Plan:

## 2014-07-06 NOTE — Assessment & Plan Note (Signed)
His blood pressure is only slightly elevated today. He is encouraged to lose weight, and maintain a low-sodium diet.

## 2014-07-06 NOTE — Assessment & Plan Note (Signed)
I suspect the patient has atrial fibrillation, but we have no evidence of this on pacemaker interrogation today. I've encouraged the patient to restart his beta blocker, although he does notice some fatigue when he takes his medication. No indication for anticoagulation at this time, although if we document atrial fibrillation on pacemaker interrogation, I would recommend starting anticoagulation therapy.

## 2014-07-06 NOTE — Assessment & Plan Note (Signed)
I have prescribed Lasix today and asked the patient to reduce his sodium intake. He'll come back next week for labs to make sure his potassium and kidney function are satisfactory. Additional medical therapy would be considered going forward dependent on how he does with his diuretic therapy and low sodium diet.

## 2014-07-06 NOTE — Patient Instructions (Addendum)
Your physician wants you to follow-up in: 6 months with Dr. Court Joyaylor You will receive a reminder letter in the mail two months in advance. If you don't receive a letter, please call our office to schedule the follow-up appointment.    Remote monitoring is used to monitor your Pacemaker or ICD from home. This monitoring reduces the number of office visits required to check your device to one time per year. It allows us to keep an eye on the functioning of your device to ensure it is working properly. You are scheduled for a device check from home on 10/05/14. You may send your transmission at any time that day. If you have a wireless device, the transmission will be sent automatically. After your physician reviews your transmission, you will receive a postcard with your next transmission date.   Your physician has recommended you make the following change in your medication:  1) Stop Dyazide 2) Start Furosemide 40mg  daily   Your physician recommends that you return for lab work in: 1 week: BMP

## 2014-07-06 NOTE — Assessment & Plan Note (Signed)
His Medtronic dual-chamber pacemaker is interrogated today and working fine. We've made no programming changes. His estimated longevity is 11 years. Patient and sensing are all satisfactory.

## 2014-07-09 ENCOUNTER — Emergency Department (HOSPITAL_COMMUNITY): Payer: BC Managed Care – PPO

## 2014-07-09 ENCOUNTER — Other Ambulatory Visit: Payer: Self-pay

## 2014-07-09 ENCOUNTER — Emergency Department (HOSPITAL_COMMUNITY)
Admission: EM | Admit: 2014-07-09 | Discharge: 2014-07-09 | Disposition: A | Discharge status: Home / Self Care | Payer: BC Managed Care – PPO | Attending: Emergency Medicine | Admitting: Emergency Medicine

## 2014-07-09 ENCOUNTER — Encounter (HOSPITAL_COMMUNITY): Payer: Self-pay | Admitting: Emergency Medicine

## 2014-07-09 DIAGNOSIS — Z7982 Long term (current) use of aspirin: Secondary | ICD-10-CM | POA: Insufficient documentation

## 2014-07-09 DIAGNOSIS — R5383 Other fatigue: Secondary | ICD-10-CM | POA: Diagnosis not present

## 2014-07-09 DIAGNOSIS — Z950 Presence of cardiac pacemaker: Secondary | ICD-10-CM | POA: Diagnosis not present

## 2014-07-09 DIAGNOSIS — K219 Gastro-esophageal reflux disease without esophagitis: Secondary | ICD-10-CM | POA: Insufficient documentation

## 2014-07-09 DIAGNOSIS — Z79899 Other long term (current) drug therapy: Secondary | ICD-10-CM | POA: Insufficient documentation

## 2014-07-09 DIAGNOSIS — E669 Obesity, unspecified: Secondary | ICD-10-CM | POA: Insufficient documentation

## 2014-07-09 DIAGNOSIS — R079 Chest pain, unspecified: Secondary | ICD-10-CM | POA: Diagnosis present

## 2014-07-09 DIAGNOSIS — R0602 Shortness of breath: Secondary | ICD-10-CM | POA: Diagnosis not present

## 2014-07-09 DIAGNOSIS — I10 Essential (primary) hypertension: Secondary | ICD-10-CM | POA: Insufficient documentation

## 2014-07-09 LAB — BASIC METABOLIC PANEL
ANION GAP: 14 (ref 5–15)
BUN: 16 mg/dL (ref 6–23)
CALCIUM: 9.2 mg/dL (ref 8.4–10.5)
CO2: 25 meq/L (ref 19–32)
Chloride: 100 mEq/L (ref 96–112)
Creatinine, Ser: 1.13 mg/dL (ref 0.50–1.35)
GFR calc Af Amer: >90 mL/min (ref >90)
GFR calc non Af Amer: 79 mL/min — ABNORMAL LOW (ref >90)
GLUCOSE: 95 mg/dL (ref 70–99)
POTASSIUM: 4 meq/L (ref 3.7–5.3)
SODIUM: 139 meq/L (ref 137–147)

## 2014-07-09 LAB — CBC
HCT: 39.9 % (ref 39.0–52.0)
HEMOGLOBIN: 13.4 g/dL (ref 13.0–17.0)
MCH: 27.7 pg (ref 26.0–34.0)
MCHC: 33.6 g/dL (ref 30.0–36.0)
MCV: 82.6 fL (ref 78.0–100.0)
PLATELETS: 215 10*3/uL (ref 150–400)
RBC: 4.83 MIL/uL (ref 4.22–5.81)
RDW: 13 % (ref 11.5–15.5)
WBC: 4.8 10*3/uL (ref 4.0–10.5)

## 2014-07-09 LAB — PRO B NATRIURETIC PEPTIDE: PRO B NATRI PEPTIDE: 8.8 pg/mL (ref 0–125)

## 2014-07-09 LAB — TROPONIN I

## 2014-07-09 NOTE — Discharge Instructions (Signed)

## 2014-07-09 NOTE — ED Notes (Signed)
To ED via [facility] from home, with c/o chest pressure that started at 11am while watching TV. Pain free on arrival. [place]. Pt took ASA 324mg  po prior to EMS arrival.

## 2014-07-11 NOTE — ED Provider Notes (Signed)
CSN: [id number]     Arrival date & time 07/09/14  1240 History   First MD Initiated Contact with Patient 07/09/14 1301     Chief Complaint  Patient presents with  . Chest Pain     (Consider location/radiation/quality/duration/timing/severity/associated sxs/prior Treatment) HPI Patient presents with acute on somewhat chronic shortness of breath and fatigue. States that he has been seen at multiple hospitals for this. He has been seen recently a his Electrophysiologist for the same. Has pacemaker for bracycardia, but also may have previously had tachycardia. There was discussion of autonomic dysfunction. Occasional chest pain. No fevers. Has had some weight gain. No cough. Symptoms have been going on for years. No swelling in his legs. Decreased exercise tolerence  Past Medical History  Diagnosis Date  . Hypertension   . Chest pain     normal LHC 2008;  ETT-Echo 8/10: normal  . Obesity   . GERD (gastroesophageal reflux disease)   . Symptomatic bradycardia     [place] in [place]  . Other specified cardiac dysrhythmias(427.89)    Past Surgical History  Procedure Laterality Date  . Insert / replace / remove pacemaker      status post pacemaker implant August 2006  . Appendectomy  1994  . Permanent pacemaker generator change N/A 04/16/2012    Procedure: PERMANENT PACEMAKER GENERATOR CHANGE;  Surgeon: Marinus MawGregg W Taylor, MD;  Location[facility];  Service: Cardiovascular;  Laterality: N/A;   Family History  Problem Relation Age of Onset  . Hypertension Other   . Diabetes Other   . Cancer Other   . CAD Other    History  Substance Use Topics  . Smoking status: Never Smoker   . Smokeless tobacco: Not on file  . Alcohol Use: No     Comment: occ    Review of Systems  Constitutional: Positive for fatigue and unexpected weight change. Negative for activity change and appetite change.  Eyes: Negative for pain.  Respiratory: Positive for shortness of breath. Negative for  chest tightness.   Cardiovascular: Positive for chest pain. Negative for leg swelling.  Gastrointestinal: Negative for nausea, vomiting, abdominal pain and diarrhea.  Genitourinary: Negative for flank pain.  Musculoskeletal: Negative for back pain and neck stiffness.  Skin: Negative for rash.  Neurological: Negative for weakness, numbness and headaches.  Psychiatric/Behavioral: Negative for behavioral problems.      Allergies  Review of patient's allergies indicates no known allergies.  Home Medications   Prior to Admission medications   Medication Sig Start Date End Date Taking? Authorizing Provider  aspirin 81 MG chewable tablet Chew 81 mg by mouth daily.   Yes Historical Provider, MD  cyclobenzaprine (FLEXERIL) 5 MG tablet Take 5 mg by mouth 3 (three) times daily as needed for muscle spasms.   Yes Historical Provider, MD  furosemide (LASIX) 40 MG tablet Take one by mouth daily and as needed for SOB 07/06/14  Yes Marinus MawGregg W Taylor, MD  metoprolol tartrate (LOPRESSOR) 25 MG tablet Take 25 mg by mouth 2 (two) times daily. 06/11/14  Yes Historical Provider, MD  NEXIUM 40 MG capsule Take 40 mg by mouth daily.  06/15/14  Yes Historical Provider, MD  triamterene-hydrochlorothiazide (MAXZIDE-25) 37.5-25 MG per tablet Take 1 tablet by mouth daily.   Yes Historical Provider, MD   BP 126/82 mmHg  Pulse 78  Temp(Src) 98.4 F (36.9 C) (Oral)  Resp 23  Ht 5\' 11"  (1.803 m)  Wt 290 lb (131.543 kg)  BMI 40.46 kg/m2  SpO2 98% Physical Exam  Constitutional: He is oriented to person, place, and time. He appears well-developed and well-nourished.  HENT:  Head: Normocephalic and atraumatic.  Eyes: EOM are normal. Pupils are equal, round, and reactive to light.  Neck: Normal range of motion. Neck supple.  Cardiovascular: Normal rate, regular rhythm and normal heart sounds.   No murmur heard. Pulmonary/Chest: Effort normal and breath sounds normal.  Pacemaker to left chest wall  Abdominal: Soft.  Bowel sounds are normal. He exhibits no distension and no mass. There is no tenderness. There is no rebound and no guarding.  Musculoskeletal: Normal range of motion. He exhibits no edema.  Neurological: He is alert and oriented to person, place, and time. No cranial nerve deficit.  Skin: Skin is warm and dry.  Psychiatric: He has a normal mood and affect.  Nursing note and vitals reviewed.   ED Course  Procedures (including critical care time) Labs Review Labs Reviewed  BASIC METABOLIC PANEL - Abnormal; Notable for the following:    GFR calc non Af Amer 79 (*)    All other components within normal limits  CBC  PRO B NATRIURETIC PEPTIDE  TROPONIN I    Imaging Review Dg Chest Port 1 View  07/09/2014   CLINICAL DATA:  Chest pain shortness of breath for 5 weeks, increase with exertion  EXAM: PORTABLE CHEST - 1 VIEW  COMPARISON:  06/11/2012  FINDINGS: Cardiac shadow is stable but mildly enlarged. A pacing device is again seen. The lungs are well aerated bilaterally. No acute bony abnormality is seen.  IMPRESSION: No acute abnormality noted.   Electronically Signed   By: Alcide CleverMark  Lukens M.D.   On: 07/09/2014 13:33     EKG Interpretation   Date/Time:  Friday July 09 2014 12:39:58 EST Ventricular Rate:  82 PR Interval:  182 QRS Duration: 108 QT Interval:  390 QTC Calculation: 455 R Axis:   -11 Text Interpretation:  Sinus rhythm ED PHYSICIAN INTERPRETATION AVAILABLE  IN [facility] Confirmed by TEST, Record ([id number]) on 07/11/2014 7:32:31  AM      MDM   Final diagnoses:  Shortness of breath  Other fatigue    Patient with shortness of breath and fatigue. History of same. Labs reassuring. Will need PCP follow up. Pacer recently interogated without abnormality.     Juliet RudeNathan R. Rubin PayorPickering, MD 07/11/14 [phone]

## 2014-07-12 ENCOUNTER — Telehealth: Payer: Self-pay | Admitting: Internal Medicine

## 2014-07-12 NOTE — Telephone Encounter (Signed)
New message    Patient calling c/o swollen by device site .aching.

## 2014-07-12 NOTE — Telephone Encounter (Signed)
Pt agreed to an appt on 12-22 at 10:30 AM w/ Device clinic.

## 2014-07-13 ENCOUNTER — Telehealth: Payer: Self-pay | Admitting: Internal Medicine

## 2014-07-13 ENCOUNTER — Other Ambulatory Visit (INDEPENDENT_AMBULATORY_CARE_PROVIDER_SITE_OTHER): Payer: BC Managed Care – PPO | Admitting: *Deleted

## 2014-07-13 ENCOUNTER — Ambulatory Visit (INDEPENDENT_AMBULATORY_CARE_PROVIDER_SITE_OTHER): Payer: BC Managed Care – PPO | Admitting: *Deleted

## 2014-07-13 DIAGNOSIS — I495 Sick sinus syndrome: Secondary | ICD-10-CM

## 2014-07-13 DIAGNOSIS — I10 Essential (primary) hypertension: Secondary | ICD-10-CM

## 2014-07-13 LAB — BASIC METABOLIC PANEL
BUN: 14 mg/dL (ref 6–23)
CALCIUM: 9 mg/dL (ref 8.4–10.5)
CO2: 27 meq/L (ref 19–32)
Chloride: 106 mEq/L (ref 96–112)
Creatinine, Ser: 1.3 mg/dL (ref 0.4–1.5)
GFR: 78.2 mL/min (ref >60.00)
Glucose, Bld: 125 mg/dL — ABNORMAL HIGH (ref 70–99)
POTASSIUM: 3.3 meq/L — AB (ref 3.5–5.1)
SODIUM: 139 meq/L (ref 135–145)

## 2014-07-13 NOTE — Telephone Encounter (Signed)
New problem    Pt is still having rapid heart beat/weakness in legs and need to speak to nurse about this.

## 2014-07-13 NOTE — Telephone Encounter (Signed)
LMOVM informing pt that  I was calling to help him set up his home monitor. Informed pt to return my call.

## 2014-07-13 NOTE — Telephone Encounter (Signed)
left message for him to send transmission so we can see what his heart rate is doing.  As far as his legs feeling weak should call his PCP and discuss

## 2014-07-13 NOTE — Progress Notes (Signed)
Wound check for concerns of swelling at the site.  No redness or swelling noted.  Denies any fever or chills.    The patient has been working out at the gym with arm and shoulder exercises. Follow up as scheduled.

## 2014-07-13 NOTE — Telephone Encounter (Signed)
Follow up       Pt states there is no way for him to send a transmission today due to him not having the equipment set up

## 2014-07-14 NOTE — Telephone Encounter (Signed)
Attempted to call pt for the 3rd time. No answer and unable to leave a message b/c voice mail box is full.

## 2014-07-14 NOTE — Telephone Encounter (Signed)
LMOVM informing pt that I was calling to help him set up his home monitor and for pt to return call as soon as he can. Second attempt.

## 2014-07-15 ENCOUNTER — Telehealth: Payer: Self-pay

## 2014-07-15 DIAGNOSIS — E876 Hypokalemia: Secondary | ICD-10-CM

## 2014-07-15 MED ORDER — POTASSIUM CHLORIDE CRYS ER 20 MEQ PO TBCR
20.0000 meq | EXTENDED_RELEASE_TABLET | Freq: Every day | ORAL | Status: DC
Start: 2014-07-15 — End: 2015-02-24

## 2014-07-15 NOTE — Telephone Encounter (Signed)
Patient had a Bmet yesterday. K+ was 3.3mg . He was started on Lasix on 07/06/2014. Per D Dunn PA, start K-Dur 20 meq daily. When should he return for a BMet?

## 2014-07-19 ENCOUNTER — Ambulatory Visit (INDEPENDENT_AMBULATORY_CARE_PROVIDER_SITE_OTHER): Payer: BC Managed Care – PPO | Admitting: Internal Medicine

## 2014-07-19 ENCOUNTER — Encounter: Payer: Self-pay | Admitting: Internal Medicine

## 2014-07-19 VITALS — BP 128/72 | HR 66 | Temp 98.1°F | Ht 69.5 in | Wt 294.0 lb

## 2014-07-19 DIAGNOSIS — R42 Dizziness and giddiness: Secondary | ICD-10-CM

## 2014-07-19 DIAGNOSIS — E669 Obesity, unspecified: Secondary | ICD-10-CM

## 2014-07-19 DIAGNOSIS — K219 Gastro-esophageal reflux disease without esophagitis: Secondary | ICD-10-CM

## 2014-07-19 DIAGNOSIS — R0602 Shortness of breath: Secondary | ICD-10-CM

## 2014-07-19 DIAGNOSIS — I10 Essential (primary) hypertension: Secondary | ICD-10-CM

## 2014-07-19 DIAGNOSIS — R519 Headache, unspecified: Secondary | ICD-10-CM

## 2014-07-19 DIAGNOSIS — R51 Headache: Secondary | ICD-10-CM

## 2014-07-19 DIAGNOSIS — I5032 Chronic diastolic (congestive) heart failure: Secondary | ICD-10-CM

## 2014-07-19 MED ORDER — ESOMEPRAZOLE MAGNESIUM 40 MG PO CPDR: 40.0000 mg | DELAYED_RELEASE_CAPSULE | Freq: Every day | ORAL | Status: DC

## 2014-07-19 NOTE — Progress Notes (Signed)
HPI  Pt presents to the clinic today to establish care. He recently moved from [place]. He is transferring care from Dr. Keene BreathNiaz at [facility].  HTN: not medicated. Does not monitor his BP at home.  Diastolic CHF: Was recently started on Lasix and Potassium supplements. Denies edema. Has had intermittent shortness of breath.  Obesity: BMI 42. Eats whatever he wants and does not exercise.  GERD: Continues to have breakthrough symptoms despite taking Nexium 40 mg daily. He will need a refill today.  Bradycardia: s/p pacemaker in 2006. Did have his device checked 06/2014.   Flu: 2015 Tetanus: 2013 Colon Screening: 2014 Vision Screening: Dentist:    He does have some concerns about some "spells" that he has been having. These spells started about October. They can occur with exertion or rest. It usually starts out with a headache. He then feels ligheateded, and weak. He feels like his legs have no blood flow to them and he feels like he will "fall out". He then develops warm sensation in his chest that travels throughout his body. After that he feels short of breath. When he gets short of breath, he gets anxious. Recent ER visit for the same. These episodes occuring about once per week.  They thought it may be anxiety. He was given Ativan but it did not help your symptoms. He wants to know what is wrong with him because he has not been able to go to work in 6 weeks because of these "spells".  Past Medical History  Diagnosis Date  . Hypertension   . Chest pain     normal LHC 2008;  ETT-Echo 8/10: normal  . Obesity   . GERD (gastroesophageal reflux disease)   . Symptomatic bradycardia     [place] pacer in 2006 in [place]  . Other specified cardiac dysrhythmias(427.89)     Current Outpatient Prescriptions  Medication Sig Dispense Refill  . aspirin 81 MG chewable tablet Chew 81 mg by mouth daily.    . cyclobenzaprine (FLEXERIL) 5 MG tablet Take 5 mg by mouth 3 (three) times  daily as needed for muscle spasms.    . furosemide (LASIX) 40 MG tablet Take one by mouth daily and as needed for SOB 135 tablet 3  . NEXIUM 40 MG capsule Take 40 mg by mouth daily.     . potassium chloride SA (K-DUR,KLOR-CON) 20 MEQ tablet Take 1 tablet (20 mEq total) by mouth daily. 30 tablet 6  . metoprolol tartrate (LOPRESSOR) 25 MG tablet Take 25 mg by mouth 2 (two) times daily.     No current facility-administered medications for this visit.    No Known Allergies  Family History  Problem Relation Age of Onset  . Hypertension Other   . Diabetes Other   . Cancer Other   . CAD Other   . Hypertension Maternal Grandmother   . Hypertension Maternal Grandfather   . Hypertension Paternal Grandmother   . Heart disease Paternal Grandfather   . Hypertension Paternal Grandfather     History   Social History  . Marital Status: Single    Spouse Name: N/A    Number of Children: N/A  . Years of Education: N/A   Occupational History  . Not on file.   Social History Main Topics  . Smoking status: Never Smoker   . Smokeless tobacco: Never Used  . Alcohol Use: 0.0 oz/week    0 Not specified per week     Comment: occasional  . Drug Use:  No  . Sexual Activity: Yes   Other Topics Concern  . Not on file   Social History Narrative   Tobacco history none. He does admit to using recreational drugs in the past. He has not smoked marijuana in 7 years. 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 Both of his parents are in there 50 s and are  in good health.    ROS:  Constitutional: Pt reports headache. Denies fever, malaise, fatigue,  or abrupt weight changes.  HEENT: Denies eye pain, eye redness, ear pain, ringing in the ears, wax buildup, runny nose, nasal congestion, bloody nose, or sore throat. Respiratory: Pt reports shortness of breath. Denies difficulty breathing,  cough or sputum production.   Cardiovascular: Denies chest pain, chest  tightness, palpitations or swelling in the hands or feet.  Gastrointestinal: Pt reports reflux. Denies abdominal pain, bloating, constipation, diarrhea or blood in the stool.  Musculoskeletal: Pt reports leg weakness. Denies decrease in range of motion, difficulty with gait, muscle pain or joint pain and swelling.  Skin: Denies redness, rashes, lesions or ulcercations.  Neurological: Pt reports numbness and tingling in hands and feet.Denies dizziness, difficulty with memory, difficulty with speech or problems with balance and coordination.  Psych: Pt reports anxiety. Denies depression, SI/HI.  No other specific complaints in a complete review of systems (except as listed in HPI above).  PE:  Ht 5' 9.5" (1.765 m)  Wt 294 lb (133.358 kg)  BMI 42.81 kg/m2 Wt Readings from Last 3 Encounters:  07/19/14 294 lb (133.358 kg)  07/09/14 290 lb (131.543 kg)  07/06/14 292 lb 12.8 oz (132.813 kg)    General: Appears his stated age, obese but in NAD. Skin: Warm dry and intact. Neck: . Neck supple, trachea midline. No massses, lumps or thyromegaly present.  Cardiovascular: Normal rate and rhythm. Distant S1,S2 noted.  Pacemaker noted in left upper chest. No murmur, rubs or gallops noted. No JVD or BLE edema. No carotid bruits noted. Pulmonary/Chest: Normal effort and diminished vesicular breath sounds. No respiratory distress. No wheezes, rales or ronchi noted.  Abdomen: Soft and nontender. Normal bowel sounds, no bruits noted. No distention or masses noted. Liver, spleen and kidneys non palpable. Neurological: Alert and oriented. Cranial nerves II-XII grossly intact. Coordination normal. +DTRs bilaterally. Psychiatric: Mood anxious and affect normal. Behavior is normal. Judgment and thought content normal.    BMET    Component Value Date/Time   NA 139 07/13/2014 1032   K 3.3* 07/13/2014 1032   CL 106 07/13/2014 1032   CO2 27 07/13/2014 1032   GLUCOSE 125* 07/13/2014 1032   BUN 14 07/13/2014  1032   CREATININE 1.3 07/13/2014 1032   CALCIUM 9.0 07/13/2014 1032   GFRNONAA 79* 07/09/2014 1253   GFRAA >90 07/09/2014 1253    Lipid Panel  No results found for: CHOL, TRIG, HDL, CHOLHDL, VLDL, LDLCALC  CBC    Component Value Date/Time   WBC 4.8 07/09/2014 1253   RBC 4.83 07/09/2014 1253   HGB 13.4 07/09/2014 1253   HCT 39.9 07/09/2014 1253   PLT 215 07/09/2014 1253   MCV 82.6 07/09/2014 1253   MCH 27.7 07/09/2014 1253   MCHC 33.6 07/09/2014 1253   RDW 13.0 07/09/2014 1253   LYMPHSABS 2.6 06/12/2012 0040   MONOABS 0.7 06/12/2012 0040   EOSABS 0.2 06/12/2012 0040   BASOSABS 0.0 06/12/2012 0040    Hgb A1C No results found for: HGBA1C   Assessment and  Plan:  Spells of headache, lightheadness, shortness of breath etc:   Will review ER records Will request records from prior PCP to review Advised him to make follow up appt, we will start workup with spirometry  RTC in 2 weeks for spirometry.

## 2014-07-19 NOTE — Assessment & Plan Note (Signed)
Now on Lasix with potassium supplement Euvolemic on exam today

## 2014-07-19 NOTE — Progress Notes (Signed)
Pre visit review using our clinic review tool, if applicable. No additional management support is needed unless otherwise documented below in the visit note. 

## 2014-07-19 NOTE — Assessment & Plan Note (Signed)
Not medicated Encouraged him to work on diet and exercise

## 2014-07-19 NOTE — Assessment & Plan Note (Signed)
Not well controlled on Nexium Advised him to take tums or rolaids for now in addtion to nexium

## 2014-07-19 NOTE — Assessment & Plan Note (Signed)
Encouraged him to work on diet and exercise 

## 2014-07-19 NOTE — Patient Instructions (Signed)

## 2014-07-20 ENCOUNTER — Telehealth: Payer: Self-pay | Admitting: Internal Medicine

## 2014-07-20 ENCOUNTER — Telehealth: Payer: Self-pay

## 2014-07-20 NOTE — Telephone Encounter (Signed)
Spoke with patient. Advised to come in for a Bmet after starting K+. He is leaving to go back to [place] Thursday or Friday. Will come Thursday 12/31 for a bmet.

## 2014-07-20 NOTE — Telephone Encounter (Signed)
emmi emailed °

## 2014-07-20 NOTE — Telephone Encounter (Signed)
Pt states he said that he did not know if it helped because he had only taken it a couple ot times--but he would like to try again to see--please advise

## 2014-07-20 NOTE — Telephone Encounter (Signed)
Pt left v/m; pt was seen 07/19/14 and pt thought discussed pt taking Ativan for anxiety but At[facility] was not at [facility] [address] [facility]. Pt request cb to see if Nicki Reaperegina Baity NP wants pt to continue taking Ativan. Pt request cb.

## 2014-07-20 NOTE — Telephone Encounter (Signed)
Mailbox is full and can not take messages.  Nothing in the note from PCP says anything about a stress test.  Only to have:   Will review ER records Will request records from prior PCP to review Advised him to make follow up appt, we will start workup with spirometry  RTC in 2 weeks for spirometry.  This appointment has been scheduled.  i have called him to let him know I can not order a stress test without the MD's order.  If his PCP wants him to have a stress test then they need to call over a place the order.  i am unable to leave him a message on his phone

## 2014-07-20 NOTE — Telephone Encounter (Signed)
Called pt but VM was full unable to LM will try again later

## 2014-07-20 NOTE — Telephone Encounter (Signed)
He told me he took it and it did not help. No need to keep taking it

## 2014-07-20 NOTE — Addendum Note (Signed)
Addended by: Judy PimpleEILAND, Telena Peyser M on: 07/20/2014 02:08 PM   Modules accepted: Orders

## 2014-07-20 NOTE — Telephone Encounter (Signed)
New problem   Pt was advise by his PCP to have a stress test done to r/o some other isures. Please call pt.

## 2014-07-21 NOTE — Telephone Encounter (Signed)
Left message on voicemail.

## 2014-07-21 NOTE — Telephone Encounter (Signed)
If it is anxiety, I would rather him been on a daily medication for anxiety such as celexa as opossed to taking Ativan which is addictive. Is he okay with me sending in the celexa?

## 2014-07-22 ENCOUNTER — Other Ambulatory Visit: Payer: Self-pay

## 2014-07-22 ENCOUNTER — Other Ambulatory Visit: Payer: Self-pay | Admitting: Internal Medicine

## 2014-07-22 MED ORDER — CITALOPRAM HYDROBROMIDE 20 MG PO TABS
20.0000 mg | ORAL_TABLET | Freq: Every day | ORAL | Status: DC
Start: 2014-07-22 — End: 2014-09-01

## 2014-07-22 NOTE — Telephone Encounter (Signed)
celexa sent to pharmacy

## 2014-07-22 NOTE — Telephone Encounter (Signed)
Pt states he is okay with trying celexa--please send to [facility] [address]--pt states he will call back to schedule OV with spirometry as he is going out of town

## 2014-07-26 ENCOUNTER — Ambulatory Visit: Payer: BC Managed Care – PPO | Admitting: Internal Medicine

## 2014-07-28 ENCOUNTER — Telehealth: Payer: Self-pay | Admitting: Internal Medicine

## 2014-07-28 NOTE — Telephone Encounter (Signed)
Spoke with patient our MR fax is [phone] and main fax is [phone]

## 2014-07-28 NOTE — Telephone Encounter (Signed)
New Msg         Pt would like a call to confirm fax from [facility] has been received.   Pt informed all faxes sent to Med Records. Pt still would like call from nurse.  Please call.

## 2014-07-28 NOTE — Telephone Encounter (Signed)
Follow up      Pt is in [place].  His cardiologist there says pt has SVT.  [place] can be [place].  He want to talk to the nurse regarding a heart cath

## 2014-07-28 NOTE — Telephone Encounter (Signed)
Spoke with patient and he is in [place].  He says that the MD there has diagnosed him with SVT and recommended an ablation.  I have advised him to obtain all the records he can so Dr Ladona Ridgelaylor can review.  He will let me know when he is back in [place]

## 2014-07-29 ENCOUNTER — Emergency Department: Payer: Self-pay | Admitting: Emergency Medicine

## 2014-07-29 ENCOUNTER — Telehealth: Payer: Self-pay | Admitting: Internal Medicine

## 2014-07-29 ENCOUNTER — Telehealth: Payer: Self-pay

## 2014-07-29 LAB — CBC
HCT: 42.7 % (ref 40.0–52.0)
HGB: 13.7 g/dL (ref 13.0–18.0)
MCH: 27.3 pg (ref 26.0–34.0)
MCHC: 32.1 g/dL (ref 32.0–36.0)
MCV: 85 fL (ref 80–100)
Platelet: 240 10*3/uL (ref 150–440)
RBC: 5.02 10*6/uL (ref 4.40–5.90)
RDW: 14.1 % (ref 11.5–14.5)
WBC: 5.6 10*3/uL (ref 3.8–10.6)

## 2014-07-29 LAB — BASIC METABOLIC PANEL
Anion Gap: 8 (ref 7–16)
BUN: 11 mg/dL (ref 7–18)
CHLORIDE: 103 mmol/L (ref 98–107)
CO2: 26 mmol/L (ref 21–32)
CREATININE: 1.44 mg/dL — AB (ref 0.60–1.30)
Calcium, Total: 8.7 mg/dL (ref 8.5–10.1)
EGFR (African American): >60
EGFR (Non-African Amer.): 57 — ABNORMAL LOW
Glucose: 86 mg/dL (ref 65–99)
Osmolality: 273 (ref 275–301)
Potassium: 3.8 mmol/L (ref 3.5–5.1)
Sodium: 137 mmol/L (ref 136–145)

## 2014-07-29 LAB — TROPONIN I: Troponin-I: <0.02 ng/mL

## 2014-07-29 NOTE — Telephone Encounter (Signed)
Ok to refill proair. Is our spirometry still broke, we may need to refer to pulmonology for PFT's

## 2014-07-29 NOTE — Telephone Encounter (Signed)
Got some of the records but need the interrogation from his device that shows SVT

## 2014-07-29 NOTE — Telephone Encounter (Signed)
Pt left note; pt cannot find Pro Air inhaler and pt request sent to [facility] [address]. Pt has appt scheduled for 08/03/14 for visit and spirometry.I do not see pro air on current or historical med list.Please advise.

## 2014-07-29 NOTE — Telephone Encounter (Signed)
New message     Calling Tresa EndoKelly to see if you got his fax

## 2014-07-30 MED ORDER — ALBUTEROL SULFATE HFA 108 (90 BASE) MCG/ACT IN AERS: 2.0000 | INHALATION_SPRAY | Freq: Four times a day (QID) | RESPIRATORY_TRACT | Status: DC | PRN

## 2014-07-30 NOTE — Telephone Encounter (Signed)
Ok, have him cancel his appt here, will have to wait until eval by pulm

## 2014-07-30 NOTE — Telephone Encounter (Signed)
Jim ColonelShannon W spoke to pt and canceled appt--pt is aware

## 2014-07-30 NOTE — Telephone Encounter (Signed)
the spirometry is still down--i see he has appt with pulm 09/01/2014

## 2014-07-31 ENCOUNTER — Emergency Department (HOSPITAL_COMMUNITY): Payer: BLUE CROSS/BLUE SHIELD

## 2014-07-31 ENCOUNTER — Emergency Department (HOSPITAL_COMMUNITY)
Admission: EM | Admit: 2014-07-31 | Discharge: 2014-07-31 | Disposition: A | Discharge status: Home / Self Care | Payer: BLUE CROSS/BLUE SHIELD | Attending: Emergency Medicine | Admitting: Emergency Medicine

## 2014-07-31 ENCOUNTER — Telehealth: Payer: Self-pay | Admitting: Physician Assistant

## 2014-07-31 ENCOUNTER — Encounter (HOSPITAL_COMMUNITY): Payer: Self-pay | Admitting: Family Medicine

## 2014-07-31 DIAGNOSIS — R110 Nausea: Secondary | ICD-10-CM | POA: Insufficient documentation

## 2014-07-31 DIAGNOSIS — K219 Gastro-esophageal reflux disease without esophagitis: Secondary | ICD-10-CM | POA: Diagnosis not present

## 2014-07-31 DIAGNOSIS — R5383 Other fatigue: Secondary | ICD-10-CM | POA: Insufficient documentation

## 2014-07-31 DIAGNOSIS — R0602 Shortness of breath: Secondary | ICD-10-CM | POA: Diagnosis not present

## 2014-07-31 DIAGNOSIS — Z7982 Long term (current) use of aspirin: Secondary | ICD-10-CM | POA: Diagnosis not present

## 2014-07-31 DIAGNOSIS — R0789 Other chest pain: Secondary | ICD-10-CM | POA: Diagnosis not present

## 2014-07-31 DIAGNOSIS — R079 Chest pain, unspecified: Secondary | ICD-10-CM | POA: Diagnosis present

## 2014-07-31 DIAGNOSIS — E669 Obesity, unspecified: Secondary | ICD-10-CM | POA: Insufficient documentation

## 2014-07-31 DIAGNOSIS — Z79899 Other long term (current) drug therapy: Secondary | ICD-10-CM | POA: Insufficient documentation

## 2014-07-31 DIAGNOSIS — R42 Dizziness and giddiness: Secondary | ICD-10-CM | POA: Insufficient documentation

## 2014-07-31 DIAGNOSIS — Z950 Presence of cardiac pacemaker: Secondary | ICD-10-CM | POA: Insufficient documentation

## 2014-07-31 DIAGNOSIS — I10 Essential (primary) hypertension: Secondary | ICD-10-CM | POA: Insufficient documentation

## 2014-07-31 LAB — BRAIN NATRIURETIC PEPTIDE: B Natriuretic Peptide: 6.4 pg/mL (ref 0.0–100.0)

## 2014-07-31 LAB — CBC
HCT: 39.9 % (ref 39.0–52.0)
HEMOGLOBIN: 13.1 g/dL (ref 13.0–17.0)
MCH: 27.4 pg (ref 26.0–34.0)
MCHC: 32.8 g/dL (ref 30.0–36.0)
MCV: 83.5 fL (ref 78.0–100.0)
Platelets: 236 10*3/uL (ref 150–400)
RBC: 4.78 MIL/uL (ref 4.22–5.81)
RDW: 13.1 % (ref 11.5–15.5)
WBC: 4.7 10*3/uL (ref 4.0–10.5)

## 2014-07-31 LAB — BASIC METABOLIC PANEL
Anion gap: 5 (ref 5–15)
BUN: 12 mg/dL (ref 6–23)
CALCIUM: 8.9 mg/dL (ref 8.4–10.5)
CHLORIDE: 106 meq/L (ref 96–112)
CO2: 27 mmol/L (ref 19–32)
Creatinine, Ser: 1.46 mg/dL — ABNORMAL HIGH (ref 0.50–1.35)
GFR calc Af Amer: 67 mL/min — ABNORMAL LOW (ref >90)
GFR, EST NON AFRICAN AMERICAN: 58 mL/min — AB (ref >90)
GLUCOSE: 85 mg/dL (ref 70–99)
POTASSIUM: 4.2 mmol/L (ref 3.5–5.1)
SODIUM: 138 mmol/L (ref 135–145)

## 2014-07-31 LAB — I-STAT TROPONIN, ED
TROPONIN I, POC: 0 ng/mL (ref 0.00–0.08)
Troponin i, poc: 0 ng/mL (ref 0.00–0.08)

## 2014-07-31 MED ORDER — NITROGLYCERIN 0.4 MG SL SUBL
0.4000 mg | SUBLINGUAL_TABLET | SUBLINGUAL | Status: DC | PRN
  Filled 2014-07-31: qty 1

## 2014-07-31 MED ORDER — ASPIRIN 81 MG PO CHEW
81.0000 mg | CHEWABLE_TABLET | Freq: Once | ORAL | Status: AC
  Administered 2014-07-31: 81 mg via ORAL
  Filled 2014-07-31: qty 1

## 2014-07-31 NOTE — ED Notes (Signed)
Brooke at [facility] called with pacemaker report, will fax it.

## 2014-07-31 NOTE — ED Notes (Addendum)
Pt here for intermittent chest pain, dizziness, funny feeling all over x a few months. sts he has seen his cardiologist for these episodes. sts also pain over left eye and HA.

## 2014-07-31 NOTE — ED Provider Notes (Signed)
CSN: [id number]     Arrival date & time 07/31/14  1421 History   First MD Initiated Contact with Patient 07/31/14 1456     Chief Complaint  Patient presents with  . Chest Pain     (Consider location/radiation/quality/duration/timing/severity/associated sxs/prior Treatment) Patient is a 44 y.o. male presenting with chest pain.  Chest Pain Pain location:  Substernal area Pain quality: pressure   Pain radiates to:  L arm Pain radiates to the back: no   Pain severity:  Mild Onset quality:  Sudden Duration:  2 hours (2 mos of episodes every other day) Timing:  Intermittent Progression:  Partially resolved Worsened by:  Exertion and movement Associated symptoms: fatigue, nausea, near-syncope and shortness of breath   Associated symptoms: no abdominal pain, no back pain, no cough, no diaphoresis, no fever, no headache, no syncope and not vomiting   Risk factors: hypertension and male sex   Risk factors: no coronary artery disease, no diabetes mellitus, no high cholesterol, no immobilization, no prior DVT/PE, no smoking and no surgery   Risk factors comment:  Pacemaker for bradycardia   Past Medical History  Diagnosis Date  . Hypertension   . Chest pain     normal LHC 2008;  ETT-Echo 8/10: normal  . Obesity   . GERD (gastroesophageal reflux disease)   . Symptomatic bradycardia     s/p pacer in 2006 in [place]  . Other specified cardiac dysrhythmias(427.89)    Past Surgical History  Procedure Laterality Date  . Insert / replace / remove pacemaker      status post pacemaker implant August 2006  . Appendectomy  1994  . Permanent pacemaker generator change N/A 04/16/2012    Procedure: PERMANENT PACEMAKER GENERATOR CHANGE;  Surgeon: Marinus Maw, MD;  Location: [facility] CATH LAB;  Service: Cardiovascular;  Laterality: N/A;   Family History  Problem Relation Age of Onset  . Hypertension Other   . Diabetes Other   . Cancer Other   . CAD Other   . Hypertension Maternal Grandmother    . Hypertension Maternal Grandfather   . Hypertension Paternal Grandmother   . Heart disease Paternal Grandfather   . Hypertension Paternal Grandfather    History  Substance Use Topics  . Smoking status: Never Smoker   . Smokeless tobacco: Never Used  . Alcohol Use: 0.0 oz/week    0 Not specified per week     Comment: occasional    Review of Systems  Constitutional: Positive for fatigue. Negative for fever and diaphoresis.  HENT: Negative for sore throat.   Eyes: Negative for visual disturbance.  Respiratory: Positive for shortness of breath. Negative for cough.   Cardiovascular: Positive for chest pain and near-syncope. Negative for syncope.  Gastrointestinal: Positive for nausea. Negative for vomiting and abdominal pain.  Genitourinary: Negative for difficulty urinating.  Musculoskeletal: Negative for back pain and neck stiffness.  Skin: Negative for rash.  Neurological: Negative for syncope and headaches.      Allergies  Review of patient's allergies indicates no known allergies.  Home Medications   Prior to Admission medications   Medication Sig Start Date End Date Taking? Authorizing Provider  albuterol (PROVENTIL HFA;VENTOLIN HFA) 108 (90 BASE) MCG/ACT inhaler Inhale 2 puffs into the lungs every 6 (six) hours as needed for wheezing or shortness of breath. 07/30/14  Yes Lorre Munroe, NP  aspirin 81 MG chewable tablet Chew 81 mg by mouth daily.   Yes Historical Provider, MD  citalopram (CELEXA) 20 MG tablet Take 1  tablet (20 mg total) by mouth daily. 07/22/14  Yes Lorre Munroeegina W Baity, NP  esomeprazole (NEXIUM) 40 MG capsule Take 1 capsule (40 mg total) by mouth daily. 07/19/14  Yes Lorre Munroeegina W Baity, NP  furosemide (LASIX) 40 MG tablet Take one by mouth daily and as needed for SOB Patient taking differently: Take 40 mg by mouth daily.  07/06/14  Yes Marinus MawGregg W Taylor, MD  metoprolol tartrate (LOPRESSOR) 25 MG tablet Take 25 mg by mouth daily as needed (Pt takes only if his heart  is racing).  06/11/14  Yes Historical Provider, MD  potassium chloride SA (K-DUR,KLOR-CON) 20 MEQ tablet Take 1 tablet (20 mEq total) by mouth daily. Patient taking differently: Take 20 mEq by mouth every other day.  07/15/14  Yes Duke SalviaSteven C Klein, MD  triamterene-hydrochlorothiazide (MAXZIDE-25) 37.5-25 MG per tablet Take 1 tablet by mouth daily as needed.   Yes Historical Provider, MD   BP 139/86 mmHg  Pulse 62  Temp(Src) 98.3 F (36.8 C)  Resp 16  SpO2 97% Physical Exam  Constitutional: He is oriented to person, place, and time. He appears well-developed and well-nourished. No distress.  HENT:  Head: Normocephalic and atraumatic.  Eyes: Conjunctivae and EOM are normal.  Neck: Normal range of motion.  Cardiovascular: Normal rate, regular rhythm, normal heart sounds and intact distal pulses.  Exam reveals no gallop and no friction rub.   No murmur heard. Pulmonary/Chest: Effort normal and breath sounds normal. No respiratory distress. He has no wheezes. He has no rales.  Abdominal: Soft. He exhibits no distension. There is no tenderness. There is no guarding.  Musculoskeletal: He exhibits no edema.  Neurological: He is alert and oriented to person, place, and time. He has normal strength. No cranial nerve deficit or sensory deficit. Coordination normal. GCS eye subscore is 4. GCS verbal subscore is 5. GCS motor subscore is 6.  Skin: Skin is warm and dry. He is not diaphoretic.  Nursing note and vitals reviewed.   ED Course  Procedures (including critical care time) Labs Review Labs Reviewed  BASIC METABOLIC PANEL - Abnormal; Notable for the following:    Creatinine, Ser 1.46 (*)    GFR calc non Af Amer 58 (*)    GFR calc Af Amer 67 (*)    All other components within normal limits  CBC  BRAIN NATRIURETIC PEPTIDE  I-STAT TROPOININ, ED    Imaging Review Dg Chest 2 View  07/31/2014   CLINICAL DATA:  Initial evaluation for chest pain for 10 months  EXAM: CHEST  2 VIEW  COMPARISON:   07/09/2014  FINDINGS: Stable mild cardiac enlargement. Stable cardiac pacer. Vascular pattern normal. Lungs clear. No effusion or pneumothorax.  IMPRESSION: No active cardiopulmonary disease.   Electronically Signed   By: Esperanza Heiraymond  Rubner M.D.   On: 07/31/2014 15:29     EKG Interpretation   Date/Time:  Saturday July 31 2014 14:26:12 EST Ventricular Rate:  78 PR Interval:  178 QRS Duration: 100 QT Interval:  372 QTC Calculation: 424 R Axis:   22 Text Interpretation:  Normal sinus rhythm Inferior infarct , age  undetermined --Noted 07-09-2014 Abnormal ECG Confirmed by Fayrene FearingJAMES  MD, MARK  [phone]) on 07/31/2014 3:16:35 PM      MDM   Final diagnoses:  Chest pain   44 year old male with a history of hypertension, pacemaker secondary to bradycardia, chronic diastolic heart failure presents with concern of intermittent episodes of chest pressure and lightheadedness. Patient had been recently seen in [place] and had  been told that he was having SVT.  He has discussed the episodes of chest pressure for one month with his primary care physician who had recommended an outpatient stress test.  Pt also describes intermittent headaches during day for 2 mos-neurologic exam within normal limits, and have low suspicion for [facility], meningitis by history.    Differential diagnosis for chest pain includes ACS, angina, PE, aortic dissection, pericarditis, pneumothorax.  EKG shows a sinus rhythm without acute ST changes and is similar to prior. Chest x-ray shows no acute pulmonary abnormality and stable cardiac enlargement.  I-STAT troponin and BNP within normal limits. No electrolyte abnormalities to account for patient's lightheadedness. Low suspicion for pulmonary embolus, aortic dissection or pericarditis given history, physical and current workup. Will interrogate patient's pacemaker to evaluate for episodes of SVT.    Pacemaker interrogation showed no abnormalities.  Discussed patient with cardiology given  exertional episodes of chest pressure, recent report of SVT (however not this episode) and cardiology recommended close outpatient evaluation.  Given ASA and pt reported CP resolved when nitro was offered.  Delta troponins negative.  Discussed all results and thought process with pt in detail.  Patient discharged in stable condition with understanding of reasons to return and will call Cardiology on Monday if they do not hear from them regarding appointment.     Rhae Lerner, MD 08/01/14 8119  Rolland Porter, MD 08/10/14 [phone]

## 2014-07-31 NOTE — Discharge Instructions (Signed)
Follow-up with your cardiologist for your planned procedure this Friday.  Return to the ER with worsening chest pain shortness of breath lightheadedness passing out or any other new or worsening symptoms.   Chest Pain (Nonspecific) It is often hard to give a specific diagnosis for the cause of chest pain. There is always a chance that your pain could be related to something serious, such as a heart attack or a blood clot in the lungs. You need to follow up with your health care provider for further evaluation. CAUSES   Heartburn.  Pneumonia or bronchitis.  Anxiety or stress.  Inflammation around your heart (pericarditis) or lung (pleuritis or pleurisy).  A blood clot in the lung.  A collapsed lung (pneumothorax). It can develop suddenly on its own (spontaneous pneumothorax) or from trauma to the chest.  Shingles infection (herpes zoster virus). The chest wall is composed of bones, muscles, and cartilage. Any of these can be the source of the pain.  The bones can be bruised by injury.  The muscles or cartilage can be strained by coughing or overwork.  The cartilage can be affected by inflammation and become sore (costochondritis). DIAGNOSIS  Lab tests or other studies may be needed to find the cause of your pain. Your health care provider may have you take a test called an ambulatory electrocardiogram (ECG). An ECG records your heartbeat patterns over a 24-hour period. You may also have other tests, such as:  Transthoracic echocardiogram (TTE). During echocardiography, sound waves are used to evaluate how blood flows through your heart.  Transesophageal echocardiogram (TEE).  Cardiac monitoring. This allows your health care provider to monitor your heart rate and rhythm in real time.  Holter monitor. This is a portable device that records your heartbeat and can help diagnose heart arrhythmias. It allows your health care provider to track your heart activity for several days, if  needed.  Stress tests by exercise or by giving medicine that makes the heart beat faster. TREATMENT   Treatment depends on what may be causing your chest pain. Treatment may include:  Acid blockers for heartburn.  Anti-inflammatory medicine.  Pain medicine for inflammatory conditions.  Antibiotics if an infection is present.  You may be advised to change lifestyle habits. This includes stopping smoking and avoiding alcohol, caffeine, and chocolate.  You may be advised to keep your head raised (elevated) when sleeping. This reduces the chance of acid going backward from your stomach into your esophagus. Most of the time, nonspecific chest pain will improve within 2-3 days with rest and mild pain medicine.  HOME CARE INSTRUCTIONS   If antibiotics were prescribed, take them as directed. Finish them even if you start to feel better.  For the next few days, avoid physical activities that bring on chest pain. Continue physical activities as directed.  Do not use any tobacco products, including cigarettes, chewing tobacco, or electronic cigarettes.  Avoid drinking alcohol.  Only take medicine as directed by your health care provider.  Follow your health care provider's suggestions for further testing if your chest pain does not go away.  Keep any follow-up appointments you made. If you do not go to an appointment, you could develop lasting (chronic) problems with pain. If there is any problem keeping an appointment, call to reschedule. SEEK MEDICAL CARE IF:   Your chest pain does not go away, even after treatment.  You have a rash with blisters on your chest.  You have a fever. SEEK IMMEDIATE MEDICAL CARE IF:  You have increased chest pain or pain that spreads to your arm, neck, jaw, back, or abdomen.  You have shortness of breath.  You have an increasing cough, or you cough up blood.  You have severe back or abdominal pain.  You feel nauseous or vomit.  You have severe  weakness.  You faint.  You have chills. This is an emergency. Do not wait to see if the pain will go away. Get medical help at once. Call your local emergency services (911 in [place] Do not drive yourself to the hospital. MAKE SURE YOU:   Understand these instructions.  Will watch your condition.  Will get help right away if you are not doing well or get worse. Document Released: 04/18/2005 Document Revised: 07/14/2013 Document Reviewed: 02/12/2008 [facility] Patient Information 2015 [place], [place]. This information is not intended to replace advice given to you by your health care provider. Make sure you discuss any questions you have with your health care provider.

## 2014-07-31 NOTE — ED Notes (Signed)
Pt denies chest pain at this time. Nitro not given.

## 2014-07-31 NOTE — ED Provider Notes (Signed)
Patient seen and evaluated. History reviewed with patient, and with Dr. Dalene SeltzerSchlossman. Exam and. Not tachycardic or hypoxemic. Arrhythmias or episodes of tachycardia or bradycardia noted on [employer] pacer evaluation. Patient has follow-up procedure with his cardiologist this Friday in [place]. He is uncertain if this is a heart catheter and EP study. His workup is negative here to think is appropriate for outpatient follow-up. His symptoms are not concerning for angina.  Clear lungs. Regular rhythm. Hemodynamically stable   Rolland PorterMark Abygayle Deltoro, MD 07/31/14 2000

## 2014-07-31 NOTE — ED Notes (Signed)
Patient is asking for clarification on medications to use at home.

## 2014-08-01 ENCOUNTER — Telehealth: Payer: Self-pay | Admitting: Physician Assistant

## 2014-08-01 NOTE — Telephone Encounter (Signed)
Received note from fellow about needing outpt f/u for SVT/chest pressure. I have left a message on our office's scheduling voicemail requesting a follow-up appointment for this upcoming week, and our office will call the patient with this information. Imari Reen PA-C

## 2014-08-02 NOTE — Telephone Encounter (Signed)
Follow Up       Pt calling stating that he is tryng to schedule a stress test. There is no order in the system, please call back and advise.

## 2014-08-02 NOTE — Telephone Encounter (Signed)
Patient was seen in ED on 07/31/2014 for chest pain sob. ED MD recommends followup here. Dayna Dunn left message as well for scheduling. He is scheduled to see Dr. Johney FrameAllred on Wed 08/04/2014 at 300 pm. Aware of this appointment.

## 2014-08-03 ENCOUNTER — Ambulatory Visit: Payer: Self-pay | Admitting: Internal Medicine

## 2014-08-04 ENCOUNTER — Encounter: Payer: BLUE CROSS/BLUE SHIELD | Admitting: Internal Medicine

## 2014-08-10 ENCOUNTER — Telehealth: Payer: Self-pay | Admitting: Internal Medicine

## 2014-08-10 NOTE — Telephone Encounter (Signed)
New Message  Pt was calling about a stress test, but no order was in the system. Pt requested to speak w/ Rn. Please call back and discuss.

## 2014-08-10 NOTE — Telephone Encounter (Signed)
Called patient to explain that I can not order a stress test without an order.  Per ED here no order.  He was to follow up with his Cardiologist in [place].  Not sure if that was done.  The call went straight through to his voice mail and the mail box is full.  I am not able to leave a message

## 2014-08-11 NOTE — Telephone Encounter (Signed)
Spoke with patient, he states he had an ablation last Friday in [place].  He has a follow up next week with MD in [place] and will have them fax over all records.  I let him know I could not order a stress test just because he feels he needs one.  The ER note states nothing about needing any test.  He will follow up in [place] and then get all records sent here.

## 2014-09-01 ENCOUNTER — Ambulatory Visit (INDEPENDENT_AMBULATORY_CARE_PROVIDER_SITE_OTHER): Payer: BLUE CROSS/BLUE SHIELD | Admitting: Pulmonary Disease

## 2014-09-01 ENCOUNTER — Encounter: Payer: Self-pay | Admitting: Pulmonary Disease

## 2014-09-01 VITALS — BP 126/78 | HR 86 | Temp 97.0°F | Ht 71.0 in | Wt 302.4 lb

## 2014-09-01 DIAGNOSIS — G4733 Obstructive sleep apnea (adult) (pediatric): Secondary | ICD-10-CM | POA: Insufficient documentation

## 2014-09-01 NOTE — Patient Instructions (Signed)
Will schedule for a formal sleep study, and call you with the results. Work on weight loss

## 2014-09-01 NOTE — Assessment & Plan Note (Signed)
The patient has a history that is very suggestive of clinically significant sleep apnea. He has had a recent [place] the end of last [place], but this showed only an AHI of 3 per hour but an RDI of 15 per hour. I have told him this does not qualify for a diagnosis of clinically significant obstructive sleep apnea. The patient is concerned that he does indeed have sleep apnea based on observers, his sleep pattern, and his degree of nonrestorative sleep. I have told him that home sleep test can definitely underestimate someone's degree of sleep apnea. The patient wishes to have a formal sleep Center study which is the gold standard. I have also encouraged him to work aggressively on weight loss.

## 2014-09-01 NOTE — Progress Notes (Signed)
   Subjective:    Patient ID: Jim Hill, male    DOB: 09-02-1970, 44 y.o.   MRN: [id number]  HPI The patient is a 44 year old male who I've been asked to see for treatment of obstructive sleep apnea. He apparently had a home sleep test while in [place] a few months ago, and was told that he had obstructive sleep apnea. This is not available to me currently. The patient has been noted to have loud snoring, as well as an abnormal breathing pattern during sleep. He has frequent awakenings at night, and is only rested about 50% the mornings upon arising. He notes definite intermittent sleep pressure during the day with inactivity, but denies having any issue with sleepiness in the evening watching television or movies. He also denies any sleepiness with driving. The patient states that his weight is up about 10-15 pounds over the last 2 years, and his Epworth score today is 5. 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   Review of Systems  Constitutional: Positive for unexpected weight change. Negative for fever.  HENT: Positive for dental problem. Negative for congestion, ear pain, nosebleeds, postnasal drip, rhinorrhea, sinus pressure, sneezing, sore throat and trouble swallowing.   Eyes: Negative for redness and itching.  Respiratory: Positive for apnea, shortness of breath and wheezing. Negative for cough and chest tightness.   Cardiovascular: Positive for palpitations. Negative for leg swelling.  Gastrointestinal: Negative for nausea and vomiting.  Genitourinary: Negative for dysuria.  Musculoskeletal: Negative for joint swelling.  Skin: Negative for rash.  Neurological: Negative for headaches.  Hematological: Does not bruise/bleed easily.  Psychiatric/Behavioral: Negative for dysphoric mood. The patient is not nervous/anxious.        Objective:   Physical Exam Constitutional:  Obese male, no acute distress  HENT:  Nares patent without discharge, mildly enlarged  turbinates  Oropharynx without exudate, palate and uvula are moderately elongated.  Eyes:  Perrla, eomi, no scleral icterus  Neck:  No JVD, no TMG  Cardiovascular:  Normal rate, regular rhythm, no rubs or gallops.  No murmurs        Intact distal pulses  Pulmonary :  Normal breath sounds, no stridor or respiratory distress   No rales, rhonchi, or wheezing  Abdominal:  Soft, nondistended, bowel sounds present.  No tenderness noted.   Musculoskeletal:  No lower extremity edema noted.  Lymph Nodes:  No cervical lymphadenopathy noted  Skin:  No cyanosis noted  Neurologic:  Alert, appropriate, moves all 4 extremities without obvious deficit.         Assessment & Plan:

## 2014-09-09 ENCOUNTER — Telehealth: Payer: Self-pay | Admitting: Internal Medicine

## 2014-09-09 NOTE — Telephone Encounter (Signed)
New message      OK to tell [employer] company pt was not seen in office between 04-2013 thru 06-2013.  They need this info to close out the case.  Pt said he has already signed a release for us to do this.

## 2014-09-10 ENCOUNTER — Telehealth: Payer: Self-pay | Admitting: Internal Medicine

## 2014-09-10 NOTE — Telephone Encounter (Signed)
Follow UP  Pt returned call from previous message sent on 09/09/2014  OK to tell [employer] company pt was not seen in office between 04-2013 thru 06-2013. They need this info to close out the case. Pt said he has already signed a release for us to do this.   Pt also says that if he would also like this on a letterhead please assist

## 2014-09-10 NOTE — Telephone Encounter (Signed)
Calling stating he needs a note sent to his short term disability stating that he was not seen in office between 04/2013-06/2013.  This is to show no preexisting conditions and then they can close his case. States the person's name is Jim Hill but did not have a phone number.  He will call back Monday to advise Tresa EndoKelly of phone number. Advised will send to Dr. Lubertha Basqueaylor's nurse Anselm PancoastKelly Lanier,RN in follow up.

## 2014-09-14 ENCOUNTER — Telehealth: Payer: Self-pay | Admitting: Internal Medicine

## 2014-09-14 ENCOUNTER — Encounter: Payer: Self-pay | Admitting: *Deleted

## 2014-09-14 NOTE — Telephone Encounter (Signed)
Follow up    Fiance calling back to speak with the nurse.

## 2014-09-14 NOTE — Telephone Encounter (Signed)
New message   patient calling  Stating nurse can speak to anyone regarding his claim.    Phone # [phone]   Claim  # [id number]

## 2014-09-14 NOTE — Telephone Encounter (Signed)
Letter done and in system under letters when patient calls back

## 2014-09-14 NOTE — Telephone Encounter (Signed)
Spoke with claim specialist and also faxed letter to [phone]

## 2014-09-24 ENCOUNTER — Telehealth: Payer: Self-pay | Admitting: Pulmonary Disease

## 2014-09-24 NOTE — Telephone Encounter (Signed)
Spoke to pt he is aware that he can call sleep center to see about getting in sooner i gave him the # to call there Tobe SosSally E Ottinger

## 2014-09-24 NOTE — Telephone Encounter (Signed)
Called and spoke to pt. Pt is requesting an earlier sleep study appt.   PCC's please advise. Thanks.

## 2014-09-24 NOTE — Telephone Encounter (Signed)
Pt returning call.Jim Hill ° °

## 2014-09-24 NOTE — Telephone Encounter (Signed)
lmomtcb x1 

## 2014-09-27 ENCOUNTER — Encounter: Payer: Self-pay | Admitting: *Deleted

## 2014-09-27 ENCOUNTER — Encounter: Payer: Self-pay | Admitting: Internal Medicine

## 2014-09-27 ENCOUNTER — Emergency Department: Payer: Self-pay | Admitting: Emergency Medicine

## 2014-09-27 ENCOUNTER — Ambulatory Visit (INDEPENDENT_AMBULATORY_CARE_PROVIDER_SITE_OTHER): Payer: BLUE CROSS/BLUE SHIELD | Admitting: Internal Medicine

## 2014-09-27 VITALS — BP 120/82 | HR 83 | Temp 98.0°F | Wt 297.5 lb

## 2014-09-27 DIAGNOSIS — R0602 Shortness of breath: Secondary | ICD-10-CM

## 2014-09-27 DIAGNOSIS — K449 Diaphragmatic hernia without obstruction or gangrene: Secondary | ICD-10-CM

## 2014-09-27 DIAGNOSIS — K219 Gastro-esophageal reflux disease without esophagitis: Secondary | ICD-10-CM

## 2014-09-27 NOTE — Patient Instructions (Signed)

## 2014-09-27 NOTE — Progress Notes (Signed)
Subjective:    Patient ID: Jim Hill, male    DOB: 1971-05-23, 44 y.o.   MRN: [id number]  HPI  Pt presents to the clinic today with c/o shortness of breath. This has been an ongoing issue for him. He was evaluated by his cardiologist in [place]. He reports they found out that he had SVT. Despite his pacemaker, they did an ablation. His shortness of breath has not improved. He does not feel his heart racing like he did before, but the shortness of breath persist. His symptoms did not improve with anxiolytic. He did see a pulmonologist to arrange a sleep study but reports he would need another referral for them to see him for his SOB. He does have CHF but is taking Lasix daily and has not noticed much edema.  Additionally, he c/o GERD. His smptoms are fairly well controlled on Nexium. He does have a history of a hiatal hernia. He is following with his GI doctor in [place] but needs to establish one up here. He will need referral to a GI doctor in the area.  Review of Systems  Past Medical History  Diagnosis Date  . Hypertension   . Chest pain     normal LHC 2008;  ETT-Echo 8/10: normal  . Obesity   . GERD (gastroesophageal reflux disease)   . Symptomatic bradycardia     s/p pacer in 2006 in [place]  . Other specified cardiac dysrhythmias(427.89)     Current Outpatient Prescriptions  Medication Sig Dispense Refill  . aspirin 81 MG chewable tablet Chew 81 mg by mouth daily.    Marland Kitchen esomeprazole (NEXIUM) 40 MG capsule Take 1 capsule (40 mg total) by mouth daily. 30 capsule 2  . furosemide (LASIX) 40 MG tablet Take one by mouth daily and as needed for SOB (Patient taking differently: Take 40 mg by mouth daily. ) 135 tablet 3  . metoprolol tartrate (LOPRESSOR) 25 MG tablet Take 25 mg by mouth daily as needed (Pt takes only if his heart is racing).     . potassium chloride SA (K-DUR,KLOR-CON) 20 MEQ tablet Take 1 tablet (20 mEq total) by mouth daily. (Patient taking differently: Take 20 mEq  by mouth every other day. ) 30 tablet 6  . triamterene-hydrochlorothiazide (MAXZIDE-25) 37.5-25 MG per tablet Take 1 tablet by mouth daily as needed.     No current facility-administered medications for this visit.    No Known Allergies  Family History  Problem Relation Age of Onset  . Hypertension Other   . Diabetes Other   . Cancer Other   . CAD Other   . Hypertension Maternal Grandmother   . Hypertension Maternal Grandfather   . Hypertension Paternal Grandmother   . Heart disease Paternal Grandfather   . Hypertension Paternal Grandfather     History   Social History  . Marital Status: Single    Spouse Name: N/A  . Number of Children: N/A  . Years of Education: N/A   Occupational History  . Not on file.   Social History Main Topics  . Smoking status: Never Smoker   . Smokeless tobacco: Never Used  . Alcohol Use: 0.0 oz/week    0 Standard drinks or equivalent per week     Comment: occasional  . Drug Use: No  . Sexual Activity: Yes   Other Topics Concern  . Not on file   Social History Narrative   Tobacco history none. He does admit to using recreational drugs in the past. He  has not smoked marijuana in 7 years. 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 Both of his parents are in there 50 s and are  in good health.     Constitutional: Denies fever, malaise, fatigue, headache or abrupt weight changes.  HEENT: Denies eye pain, eye redness, ear pain, ringing in the ears, wax buildup, runny nose, nasal congestion, bloody nose, or sore throat. Respiratory: Pt reports shortness of breath. Denies difficulty breathing, cough or sputum production.   Cardiovascular: Denies chest pain, chest tightness, palpitations or swelling in the hands or feet.  Gastrointestinal: Pt reports reflux. Denies abdominal pain, bloating, constipation, diarrhea or blood in the stool.   No other specific complaints in a complete review of systems (except  as listed in HPI above).     Objective:   Physical Exam   BP 120/82 mmHg  Pulse 83  Temp(Src) 98 F (36.7 C) (Oral)  Wt 297 lb 8 oz (134.945 kg)  SpO2 97% Wt Readings from Last 3 Encounters:  09/27/14 297 lb 8 oz (134.945 kg)  09/01/14 302 lb 6.4 oz (137.168 kg)  07/19/14 294 lb (133.358 kg)    General: Appears his stated age, obese in NAD. Cardiovascular: Normal rate and rhythm. S1,S2 noted.  No murmur, rubs or gallops noted. No JVD or BLE edema. No carotid bruits noted. Pulmonary/Chest: Normal effort and positive vesicular breath sounds. No respiratory distress. No wheezes, rales or ronchi noted.  Abdomen: Soft and nontender. Normal bowel sounds, no bruits noted. No distention or masses noted. Liver, spleen and kidneys non palpable.  BMET    Component Value Date/Time   NA 138 07/31/2014 1525   K 4.2 07/31/2014 1525   CL 106 07/31/2014 1525   CO2 27 07/31/2014 1525   GLUCOSE 85 07/31/2014 1525   BUN 12 07/31/2014 1525   CREATININE 1.46* 07/31/2014 1525   CALCIUM 8.9 07/31/2014 1525   GFRNONAA 58* 07/31/2014 1525   GFRAA 67* 07/31/2014 1525    Lipid Panel  No results found for: CHOL, TRIG, HDL, CHOLHDL, VLDL, LDLCALC  CBC    Component Value Date/Time   WBC 4.7 07/31/2014 1525   RBC 4.78 07/31/2014 1525   HGB 13.1 07/31/2014 1525   HCT 39.9 07/31/2014 1525   PLT 236 07/31/2014 1525   MCV 83.5 07/31/2014 1525   MCH 27.4 07/31/2014 1525   MCHC 32.8 07/31/2014 1525   RDW 13.1 07/31/2014 1525   LYMPHSABS 2.6 06/12/2012 0040   MONOABS 0.7 06/12/2012 0040   EOSABS 0.2 06/12/2012 0040   BASOSABS 0.0 06/12/2012 0040    Hgb A1C No results found for: HGBA1C      Assessment & Plan:   Continue shortness of breath:  Exam normal He has evaluated by cardiology- stress test normal Will refer to pulmonology for further evaluation  GERD with hiatal hernia:  Fair control on Nexium Advised him to avoid things that trigger his reflux Will refer to GI for further  evaluation  RTC as needed

## 2014-09-27 NOTE — Progress Notes (Signed)
Pre visit review using our clinic review tool, if applicable. No additional management support is needed unless otherwise documented below in the visit note. 

## 2014-09-29 ENCOUNTER — Ambulatory Visit (HOSPITAL_BASED_OUTPATIENT_CLINIC_OR_DEPARTMENT_OTHER): Payer: BLUE CROSS/BLUE SHIELD | Attending: Pulmonary Disease | Admitting: *Deleted

## 2014-09-29 ENCOUNTER — Encounter: Payer: Self-pay | Admitting: *Deleted

## 2014-09-29 ENCOUNTER — Ambulatory Visit (INDEPENDENT_AMBULATORY_CARE_PROVIDER_SITE_OTHER): Payer: BLUE CROSS/BLUE SHIELD | Admitting: *Deleted

## 2014-09-29 ENCOUNTER — Telehealth: Payer: Self-pay | Admitting: Internal Medicine

## 2014-09-29 VITALS — Ht 70.0 in | Wt 290.0 lb

## 2014-09-29 DIAGNOSIS — G4733 Obstructive sleep apnea (adult) (pediatric): Secondary | ICD-10-CM | POA: Diagnosis not present

## 2014-09-29 DIAGNOSIS — R0683 Snoring: Secondary | ICD-10-CM | POA: Insufficient documentation

## 2014-09-29 DIAGNOSIS — I495 Sick sinus syndrome: Secondary | ICD-10-CM

## 2014-09-29 LAB — MDC_IDC_ENUM_SESS_TYPE_INCLINIC
Battery Impedance: 149 Ohm
Battery Voltage: 2.79 V
Brady Statistic AP VS Percent: 15 %
Brady Statistic AS VP Percent: 0 %
Brady Statistic AS VS Percent: 84 %
Lead Channel Impedance Value: 390 Ohm
Lead Channel Pacing Threshold Pulse Width: 0.4 ms
Lead Channel Pacing Threshold Pulse Width: 0.4 ms
Lead Channel Setting Pacing Amplitude: 1.5 V
Lead Channel Setting Pacing Amplitude: 2.25 V
Lead Channel Setting Pacing Pulse Width: 0.4 ms
MDC IDC MSMT BATTERY REMAINING LONGEVITY: 142 mo
MDC IDC MSMT LEADCHNL RA PACING THRESHOLD AMPLITUDE: 0.5 V
MDC IDC MSMT LEADCHNL RA SENSING INTR AMPL: 2 mV
MDC IDC MSMT LEADCHNL RV IMPEDANCE VALUE: 540 Ohm
MDC IDC MSMT LEADCHNL RV PACING THRESHOLD AMPLITUDE: 1 V
MDC IDC MSMT LEADCHNL RV SENSING INTR AMPL: 5.6 mV
MDC IDC SESS DTM: 20160309112136
MDC IDC SET LEADCHNL RV SENSING SENSITIVITY: 2 mV
MDC IDC STAT BRADY AP VP PERCENT: 0 %

## 2014-09-29 NOTE — Progress Notes (Signed)
Pacemaker check in clinic. Normal device function. Thresholds, sensing, impedances consistent with previous measurements.  Noise noted on the ventricular lead with a normal thershold and impedance.   Device programmed to maximize longevity. 2 mode switches and 1 high ventricular rates noted that were 1:1 SVT.  Two of the episodes were s/p SVT ablation done 08/06/14 in [place] and the patient was seen in the ED for these.   Device programmed at appropriate safety margins. Histogram distribution appropriate for patient activity level. Device programmed to optimize intrinsic conduction. Estimated longevity 12 years. Patient enrolled in remote follow-up/TTM's with [employer]. Plan to follow every 3 months remotely and see annually in office. Patient education completed.  ROV in June with Dr. Ladona Ridgelaylor.

## 2014-09-29 NOTE — Telephone Encounter (Signed)
Spoke to pt and gave them Pulm #. They will call to see which office has something sooner.

## 2014-09-29 NOTE — Telephone Encounter (Signed)
Pt would like melanie to call him about the specialty appointment that was rescheduled He had some ??

## 2014-09-29 NOTE — Telephone Encounter (Signed)
Pt states the pulmonology appt was canceled per provider and he works in [place]. Pt has to take off a period of time from work to come back to [place] to make appt. Pt wants ton know if there is any way [facility]

## 2014-09-30 ENCOUNTER — Telehealth: Payer: Self-pay | Admitting: Internal Medicine

## 2014-09-30 NOTE — Telephone Encounter (Signed)
Spoke with patient and let him know I would discuss with Dr Ladona Ridgelaylor next week and call him back with his recommendations

## 2014-09-30 NOTE — Telephone Encounter (Signed)
Tried to call patient back but got his voicemail.  It is full and can not take any messages

## 2014-09-30 NOTE — Telephone Encounter (Signed)
New Message  Pt requested to speak w/ Rn about condition/ and possible ASAP appt. Pt was sched for Taylor's next avail- 4/22. Please call back and discuss.

## 2014-10-04 ENCOUNTER — Institutional Professional Consult (permissible substitution): Payer: BLUE CROSS/BLUE SHIELD | Admitting: Internal Medicine

## 2014-10-07 ENCOUNTER — Institutional Professional Consult (permissible substitution): Payer: BLUE CROSS/BLUE SHIELD | Admitting: Internal Medicine

## 2014-10-08 DIAGNOSIS — G4733 Obstructive sleep apnea (adult) (pediatric): Secondary | ICD-10-CM

## 2014-10-08 NOTE — Sleep Study (Signed)
   NAME: Jim BlalockDuane Hill DATE OF BIRTH:  02/24/1971 MEDICAL RECORD NUMBER [id number]  LOCATION: [facility]  PHYSICIAN: Barbaraann ShareCLANCE,Chereese Cilento M  DATE OF STUDY: 09/29/2014  SLEEP STUDY TYPE: Nocturnal Polysomnogram               REFERRING PHYSICIAN: Elecia Serafin, Maree KrabbeKeith M, MD  INDICATION FOR STUDY: Hypersomnia with sleep apnea  EPWORTH SLEEPINESS SCORE:  6 HEIGHT: 5\' 10"  (177.8 cm)  WEIGHT: 290 lb (131.543 kg)    Body mass index is 41.61 kg/(m^2).  NECK SIZE: 19 in.  MEDICATIONS: Reviewed in the sleep record  SLEEP ARCHITECTURE: The patient had a total sleep time of 356 minutes with very little slow-wave sleep and only 60 minutes of REM. Sleep onset latency was normal at 2.5 minutes, and REM onset was normal at 93 minutes. Sleep efficiency was normal at 96%.  RESPIRATORY DATA: The patient was found to have 6 obstructive apneas and 37 obstructive hypopneas, giving him an AHI of only 7 events per hour. The events were increased in the supine position, and were also more prominent during REM. The patient's REM AHI was 19 events per hour. Loud snoring was noted throughout.  OXYGEN DATA: The patient had oxygen desaturation as low as 79% with his obstructive events  CARDIAC DATA: Rare PVC noted  MOVEMENT/PARASOMNIA: No significant periodic limb movements or other abnormal behaviors were noted.  IMPRESSION/ RECOMMENDATION:    1) mild obstructive sleep apnea/hypopnea syndrome, with an AHI of 7 events per hour and transient oxygen desaturation as low as 79%. Treatment for this degree of sleep apnea can include a trial of weight loss alone, upper airway surgery, dental appliance, and also C Pap. Because of the mild nature of his sleep apnea, but the decision to treat this aggressively should be based upon its impact to his quality of life. Clinical correlation is suggested.  2) rare PVC noted, but no clinically significant arrhythmias were seen.    Barbaraann ShareLANCE,Yosselyn Tax M Diplomate, American  Board of Sleep Medicine  ELECTRONICALLY SIGNED ON:  10/08/2014, 9:21 AM  [facility] PH: [phone]   FX: [phone] ACCREDITED BY THE AMERICAN ACADEMY OF SLEEP MEDICINE

## 2014-10-08 NOTE — Progress Notes (Signed)
Pt needs ov to review his sleep study 

## 2014-10-08 NOTE — Progress Notes (Signed)
[facility]

## 2014-10-08 NOTE — Telephone Encounter (Signed)
Discussed with Dr Ladona Ridgelaylor.  No need for patient to come in as episodes he was calling SVT were sinus tach.  I will ask the patient to obtain the [facility]  Unable to leave a message for patient as his voicemail is full

## 2014-10-11 ENCOUNTER — Institutional Professional Consult (permissible substitution): Payer: BLUE CROSS/BLUE SHIELD | Admitting: Internal Medicine

## 2014-10-12 ENCOUNTER — Encounter: Payer: Self-pay | Admitting: Internal Medicine

## 2014-10-13 ENCOUNTER — Encounter: Payer: Self-pay | Admitting: Pulmonary Disease

## 2014-10-13 ENCOUNTER — Ambulatory Visit (INDEPENDENT_AMBULATORY_CARE_PROVIDER_SITE_OTHER): Payer: BLUE CROSS/BLUE SHIELD | Admitting: Pulmonary Disease

## 2014-10-13 VITALS — BP 130/82 | HR 84 | Ht 71.0 in | Wt 290.0 lb

## 2014-10-13 DIAGNOSIS — G4733 Obstructive sleep apnea (adult) (pediatric): Secondary | ICD-10-CM

## 2014-10-13 NOTE — Progress Notes (Signed)
   Subjective:    Patient ID: Jim BlalockDuane Hill, male    DOB: March 12, 1971, 44 y.o.   MRN: [id number]  HPI The patient comes in today for follow-up after his recent sleep study. He was found to have mild obstructive sleep apnea, with an AHI of 7 events per hour and oxygen desaturation as low as 79% transiently. I have reviewed the study with him in detail, and answered all of his questions.   Review of Systems  Constitutional: Negative for fever and unexpected weight change.  HENT: Negative for congestion, dental problem, ear pain, nosebleeds, postnasal drip, rhinorrhea, sinus pressure, sneezing, sore throat and trouble swallowing.   Eyes: Negative for redness and itching.  Respiratory: Negative for cough, chest tightness, shortness of breath and wheezing.   Cardiovascular: Negative for palpitations and leg swelling.  Gastrointestinal: Negative for nausea and vomiting.  Genitourinary: Negative for dysuria.  Musculoskeletal: Negative for joint swelling.  Skin: Negative for rash.  Neurological: Negative for headaches.  Hematological: Does not bruise/bleed easily.  Psychiatric/Behavioral: Negative for dysphoric mood. The patient is not nervous/anxious.        Objective:   Physical Exam Obese male in no acute distress Nose without purulence or discharge noted Neck without lymphadenopathy or thyromegaly Lower extremities without edema, no cyanosis Alert and oriented, moves all 4 extremities.      Assessment & Plan:

## 2014-10-13 NOTE — Patient Instructions (Signed)
Will start on cpap at a moderate pressure level.  Please call if you are having tolerance issues.  Work on weight loss followup with me again in 8 weeks.  

## 2014-10-13 NOTE — Assessment & Plan Note (Signed)
The patient has mild obstructive sleep apnea based on his recent study, but is having significant symptoms that are impacting his quality of life. I've outlined a conservative approach with a trial of weight loss alone, versus a more aggressive approach with either a dental appliance or CPAP. Have discussed both the advantages and disadvantages of all of these treatments, and the patient would like to start on C Pap as a trial. I will set the patient up on cpap at a moderate pressure level to allow for desensitization, and will troubleshoot the device over the next 4-6weeks if needed.  The pt is to call me if having issues with tolerance.  Will then optimize the pressure once patient is able to wear cpap on a consistent basis.

## 2014-10-14 ENCOUNTER — Encounter: Payer: Self-pay | Admitting: *Deleted

## 2014-10-14 ENCOUNTER — Institutional Professional Consult (permissible substitution): Payer: BLUE CROSS/BLUE SHIELD | Admitting: Internal Medicine

## 2014-11-10 ENCOUNTER — Emergency Department
Admit: 2014-11-10 | Disposition: A | Discharge status: Home / Self Care | Payer: Self-pay | Admitting: Emergency Medicine

## 2014-11-10 LAB — BASIC METABOLIC PANEL
ANION GAP: 4 — AB (ref 7–16)
BUN: 16 mg/dL
Calcium, Total: 8.7 mg/dL — ABNORMAL LOW
Chloride: 108 mmol/L
Co2: 26 mmol/L
Creatinine: 1.37 mg/dL — ABNORMAL HIGH
EGFR (Non-African Amer.): >60
Glucose: 106 mg/dL — ABNORMAL HIGH
POTASSIUM: 3.5 mmol/L
Sodium: 138 mmol/L

## 2014-11-10 LAB — CBC
HCT: 38.5 % — AB (ref 40.0–52.0)
HGB: 12.7 g/dL — AB (ref 13.0–18.0)
MCH: 27.5 pg (ref 26.0–34.0)
MCHC: 32.9 g/dL (ref 32.0–36.0)
MCV: 84 fL (ref 80–100)
Platelet: 204 10*3/uL (ref 150–440)
RBC: 4.61 10*6/uL (ref 4.40–5.90)
RDW: 14.5 % (ref 11.5–14.5)
WBC: 5.7 10*3/uL (ref 3.8–10.6)

## 2014-11-10 LAB — TROPONIN I
Troponin-I: <0.03 ng/mL
Troponin-I: <0.03 ng/mL

## 2014-11-11 ENCOUNTER — Encounter (HOSPITAL_BASED_OUTPATIENT_CLINIC_OR_DEPARTMENT_OTHER): Payer: BLUE CROSS/BLUE SHIELD

## 2014-11-12 ENCOUNTER — Encounter: Payer: Self-pay | Admitting: Internal Medicine

## 2014-11-12 ENCOUNTER — Ambulatory Visit (INDEPENDENT_AMBULATORY_CARE_PROVIDER_SITE_OTHER): Payer: BLUE CROSS/BLUE SHIELD | Admitting: Internal Medicine

## 2014-11-12 ENCOUNTER — Other Ambulatory Visit: Payer: Self-pay | Admitting: Internal Medicine

## 2014-11-12 ENCOUNTER — Telehealth: Payer: Self-pay

## 2014-11-12 VITALS — BP 120/80 | HR 85 | Ht 71.0 in | Wt 301.0 lb

## 2014-11-12 DIAGNOSIS — I495 Sick sinus syndrome: Secondary | ICD-10-CM

## 2014-11-12 DIAGNOSIS — R002 Palpitations: Secondary | ICD-10-CM | POA: Diagnosis not present

## 2014-11-12 DIAGNOSIS — Z950 Presence of cardiac pacemaker: Secondary | ICD-10-CM | POA: Diagnosis not present

## 2014-11-12 DIAGNOSIS — R0602 Shortness of breath: Secondary | ICD-10-CM

## 2014-11-12 DIAGNOSIS — I10 Essential (primary) hypertension: Secondary | ICD-10-CM

## 2014-11-12 DIAGNOSIS — I5032 Chronic diastolic (congestive) heart failure: Secondary | ICD-10-CM

## 2014-11-12 LAB — MDC_IDC_ENUM_SESS_TYPE_INCLINIC
Battery Remaining Longevity: 141 mo
Battery Voltage: 2.79 V
Brady Statistic AP VP Percent: 0 %
Brady Statistic AP VS Percent: 22 %
Brady Statistic AS VP Percent: 0 %
Brady Statistic AS VS Percent: 78 %
Date Time Interrogation Session: 20160422083214
Lead Channel Impedance Value: 390 Ohm
Lead Channel Pacing Threshold Pulse Width: 0.4 ms
Lead Channel Sensing Intrinsic Amplitude: 2.8 mV
Lead Channel Sensing Intrinsic Amplitude: 5.6 mV
Lead Channel Setting Pacing Amplitude: 2 V
Lead Channel Setting Pacing Pulse Width: 0.4 ms
Lead Channel Setting Sensing Sensitivity: 2 mV
MDC IDC MSMT BATTERY IMPEDANCE: 149 Ohm
MDC IDC MSMT LEADCHNL RA PACING THRESHOLD AMPLITUDE: 0.5 V
MDC IDC MSMT LEADCHNL RA PACING THRESHOLD PULSEWIDTH: 0.4 ms
MDC IDC MSMT LEADCHNL RV IMPEDANCE VALUE: 543 Ohm
MDC IDC MSMT LEADCHNL RV PACING THRESHOLD AMPLITUDE: 1.25 V
MDC IDC SET LEADCHNL RV PACING AMPLITUDE: 2.75 V

## 2014-11-12 NOTE — Assessment & Plan Note (Signed)
I think this is his biggest problem. His obesity is exacerbating. Will see how functional he is on the treadmill. He is encouraged to lose weight and reduce his sodium intake.

## 2014-11-12 NOTE — Assessment & Plan Note (Signed)
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 Will recheck in several months. 

## 2014-11-12 NOTE — Telephone Encounter (Signed)
Pt called in reference to making appt for spirometry--but pt is currently seeing Pulm and has appt in May--pt states that Pulm said he would need a referral for the PFT as well--please advise if pt is to make a f/u appt here or are you going to send referral to current Pulm for them to do there--please advise

## 2014-11-12 NOTE — Telephone Encounter (Signed)
Pt has canceled 2 appts with Dr Sherene SiresWert for SOB--so the referral is still active?--I asked pt to call [facility] and ask to reschedule appt

## 2014-11-12 NOTE — Patient Instructions (Signed)
Medication Instructions:  Your physician recommends that you continue on your current medications as directed. Please refer to the Current Medication list given to you today.   Labwork: None ordered  Testing/Procedures: Your physician has requested that you have an exercise tolerance test. For further information please visit [email or web address]. Please also follow instruction sheet, as given.    Follow-Up: As scheduled with Dr Ladona Ridgelaylor  Any Other Special Instructions Will Be Listed Below (If Applicable).  We are trying to obtain [facility]

## 2014-11-12 NOTE — Telephone Encounter (Signed)
PFT ordered, he will need to call [facility] pulm to reschedule appt

## 2014-11-12 NOTE — Assessment & Plan Note (Signed)
His blood pressure today is well controlled. Will recheck in several months. He is encouraged to lose weight.

## 2014-11-12 NOTE — Progress Notes (Signed)
HPI Jim Hill returns today for follow-up. He is a very pleasant 44 year old man with a history of symptomatic bradycardia, status post permanent pacemaker insertion in 2006 in [place]. I suspect he has autonomic dysfunction but this is not well documented. He underwent generator change in 2013. He has gained over 100 pounds in the last 20 years. He lives in [place] but works in [place]. He is currenly out on medical leave. He had a catheter ablation 2 weeks ago and returns for followup. I have no records. He has dyspnea with exertion. He describes palpitations and states that when his heart races, he gets sob and chest pressure. This occurs when he exerts himself but at other times as well.  No Known Allergies   Current Outpatient Prescriptions  Medication Sig Dispense Refill  . aspirin 81 MG chewable tablet Chew 81 mg by mouth daily.    Marland Kitchen CARTIA XT 180 MG 24 hr capsule 180 mg daily.    Marland Kitchen esomeprazole (NEXIUM) 40 MG capsule Take 1 capsule (40 mg total) by mouth daily. (Patient taking differently: Take 40 mg by mouth daily as needed. ) 30 capsule 2  . furosemide (LASIX) 40 MG tablet Take one by mouth daily and as needed for SOB (Patient taking differently: Take 40 mg by mouth daily. ) 135 tablet 3  . potassium chloride SA (K-DUR,KLOR-CON) 20 MEQ tablet Take 1 tablet (20 mEq total) by mouth daily. (Patient taking differently: Take 20 mEq by mouth daily as needed. ) 30 tablet 6  . triamterene-hydrochlorothiazide (MAXZIDE-25) 37.5-25 MG per tablet Take 1 tablet by mouth daily.      No current facility-administered medications for this visit.     Past Medical History  Diagnosis Date  . Hypertension   . Chest pain     normal LHC 2008;  ETT-Echo 8/10: normal  . Obesity   . GERD (gastroesophageal reflux disease)   . Symptomatic bradycardia     s/p pacer in 2006 in [place]  . Other specified cardiac dysrhythmias(427.89)     ROS:   All systems reviewed and  negative except as noted in the HPI.   Past Surgical History  Procedure Laterality Date  . Insert / replace / remove pacemaker      status post pacemaker implant August 2006  . Appendectomy  1994  . Permanent pacemaker generator change N/A 04/16/2012    Procedure: PERMANENT PACEMAKER GENERATOR CHANGE;  Surgeon: Marinus Maw, MD;  Location: [facility] CATH LAB;  Service: Cardiovascular;  Laterality: N/A;     Family History  Problem Relation Age of Onset  . Hypertension Other   . Diabetes Other   . Cancer Other   . CAD Other   . Hypertension Maternal Grandmother   . Hypertension Maternal Grandfather   . Hypertension Paternal Grandmother   . Heart disease Paternal Grandfather   . Hypertension Paternal Grandfather      History   Social History  . Marital Status: Single    Spouse Name: N/A  . Number of Children: N/A  . Years of Education: N/A   Occupational History  . Not on file.   Social History Main Topics  . Smoking status: Never Smoker   . Smokeless tobacco: Never Used  . Alcohol Use: 0.0 oz/week    0 Standard drinks or equivalent per week     Comment: occasional  . Drug Use: No  . Sexual Activity: Yes   Other Topics Concern  . Not on  file   Social History Narrative   Tobacco history none. He does admit to using recreational drugs in the past. He has not smoked marijuana in 7 years. 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 Both of his parents are in there 50 s and are  in good health.     BP 120/80 mmHg  Pulse 85  Ht 5\' 11"  (1.803 m)  Wt 301 lb (136.533 kg)  BMI 42.00 kg/m2  Physical Exam:  Well appearing 44 year old man, NAD HEENT: Unremarkable Neck:  No JVD, no thyromegally Lymphatics:  No adenopathy Back:  No CVA tenderness Lungs:  Clear, with no wheezes, rales, or rhonchi. HEART:  Regular rate rhythm, no murmurs, no rubs, no clicks Abd:  soft, positive bowel sounds, no organomegally, no rebound, no  guarding Ext:  2 plus pulses, no edema, no cyanosis, no clubbing Skin:  No rashes no nodules Neuro:  CN II through XII intact, motor grossly intact  DEVICE  Normal device function.  See PaceArt for details.   Assess/Plan:

## 2014-11-12 NOTE — Assessment & Plan Note (Signed)
The etiology of his symptoms is unclear. He could still have SVT or sinus tachycardia. His symptoms tend to occur with exertion so I have asked him to undergo exercise testing. Will schedule a regular treadmill. We will try and get his records of his cathter ablation.

## 2014-11-17 ENCOUNTER — Ambulatory Visit (HOSPITAL_COMMUNITY)
Admission: RE | Admit: 2014-11-17 | Discharge: 2014-11-17 | Disposition: A | Discharge status: Home / Self Care | Payer: BLUE CROSS/BLUE SHIELD | Source: Ambulatory Visit | Attending: Internal Medicine | Admitting: Internal Medicine

## 2014-11-17 ENCOUNTER — Other Ambulatory Visit: Payer: Self-pay

## 2014-11-17 DIAGNOSIS — R0789 Other chest pain: Secondary | ICD-10-CM | POA: Diagnosis present

## 2014-11-17 DIAGNOSIS — R002 Palpitations: Secondary | ICD-10-CM | POA: Diagnosis not present

## 2014-11-17 NOTE — Progress Notes (Signed)
    Patient saw Dr Ladona Ridgelaylor in office on 11/12/14 and complained of palpitations and SOB. Dr Ladona Ridgelaylor felt that he could still have SVT or sinus tachycardia (previous SVT ablations). His symptoms tend to occur with exertion so he was asked to undergo exercise testing.  He presents for GXT today.   Target HR 150. Max HR achieved 152.  He walked 10 minute 2 seconds on Bruce protocol  No acute ST or TW changes. No arrhythmias noted.   He did quite well with no CP or SOB.   Cline CrockKathryn Kalliopi Coupland PA-C  [facility]

## 2014-11-22 ENCOUNTER — Telehealth: Payer: Self-pay | Admitting: *Deleted

## 2014-11-22 NOTE — Telephone Encounter (Signed)
Let patient know that he passed his stress test and no arrhythmias occurred. I will see him back in 4-6 weeks. GT    ----- Message -----    From: Janetta HoraKathryn R Thompson, PA-C    Sent: 11/17/2014  1:40 PM     To: Marinus MawGregg W Taylor, MD   Subject: Inpatient Notes                     Normal gxt       Spoke with patient and let him know results and to keep his appointment in June.  He is requesting and inquiring about his records from [facility] and if Dr Ladona Ridgelaylor would be able to release him to return to work.  He has not been back to [facility].  We have requested records from [facility] on 11/16/14.  Kim in medical records has the release.  Patient aware we are waiting on records and to keep his follow up.

## 2014-11-24 ENCOUNTER — Encounter: Payer: Self-pay | Admitting: Internal Medicine

## 2014-11-29 ENCOUNTER — Ambulatory Visit (INDEPENDENT_AMBULATORY_CARE_PROVIDER_SITE_OTHER): Payer: BLUE CROSS/BLUE SHIELD | Admitting: Internal Medicine

## 2014-11-29 ENCOUNTER — Encounter: Payer: Self-pay | Admitting: Internal Medicine

## 2014-11-29 ENCOUNTER — Encounter (INDEPENDENT_AMBULATORY_CARE_PROVIDER_SITE_OTHER): Payer: Self-pay

## 2014-11-29 VITALS — BP 120/74 | HR 71 | Ht 70.5 in | Wt 304.0 lb

## 2014-11-29 DIAGNOSIS — R635 Abnormal weight gain: Secondary | ICD-10-CM | POA: Insufficient documentation

## 2014-11-29 DIAGNOSIS — R0600 Dyspnea, unspecified: Secondary | ICD-10-CM

## 2014-11-29 DIAGNOSIS — R0609 Other forms of dyspnea: Secondary | ICD-10-CM | POA: Insufficient documentation

## 2014-11-29 NOTE — Patient Instructions (Signed)
Nexium 40 mg Take 30- 60 min before your first and last meals of the day   We [facility] fax[facility] [phone]   GERD (REFLUX)  is an extremely common cause of respiratory symptoms just like yours , many times with no obvious heartburn at all.    It can be treated with medication, but also with lifestyle changes including elevation of head of bed by  blocksavoidance of late meals, excessive alcohol, smoking cessation, and avoid fatty foods, chocolate, peppermint, colas, red wine, and acidic juices such as orange juice.  NO MINT OR MENTHOL PRODUCTS SO NO COUGH DROPS  USE SUGARLESS CANDY INSTEAD (Jolley ranchers or Stover's or Life Savers) or even ice chips will also do - the key is to swallow to prevent all throat clearing. NO OIL BASED VITAMINS - use powdered substitutes.  Weight control is simply a matter of calorie balance which needs to be tilted in your favor by eating less and exercising more.  To get the most out of exercise, you need to be continuously aware that you are short of breath, but never out of breath, for 30 minutes daily. As you improve, it will actually be easier for you to do the same amount of exercise  in  30 minutes so always push to the level where you are short of breath.  If this does not result in gradual weight reduction then I strongly recommend you see a nutritionist with a food diary x 2 weeks so that we can work out a negative calorie balance which is universally effective in steady weight loss programs.  Think of your calorie balance like you do your bank account where in this case you want the balance to go down so you must take in less calories than you burn up.  It's just that simple:  Hard to do, but easy to understand.  Good luck!   Please schedule a follow up office visit in 6 weeks, call sooner if needed

## 2014-11-29 NOTE — Assessment & Plan Note (Signed)
-   GXT 11/17/14 but did not reproduce symptoms - CTa 11/10/14 neg pe/ ILD    Symptoms are markedly disproportionate to objective findings and not clear this is a lung problem but pt does appear to have difficult airway management issues. DDX of  difficult airways management all start with A and  include Adherence, Ace Inhibitors, Acid Reflux, Active Sinus Disease, Alpha 1 Antitripsin deficiency, Anxiety masquerading as Airways dz,  ABPA,  allergy(esp in young), Aspiration (esp in elderly), Adverse effects of DPI,  Active smokers, plus two Bs  = Bronchiectasis and Beta blocker use..and one C= CHF   Adherence is always the initial "prime suspect" and is a multilayered concern that requires a "trust but verify" approach in every patient - starting with knowing how to use medications, especially inhalers, correctly, keeping up with refills and understanding the fundamental difference between maintenance and prns vs those medications only taken for a very short course and then stopped and not refilled.   ? Acid (or non-acid) GERD > always difficult to exclude as up to 75% of pts in some series report no assoc GI/ Heartburn symptoms> rec max (24h)  acid suppression and diet restrictions/ reviewed - he acted like he was hearing much of this for the first time so suspect the GI w/u was directed at the scoping only and will request these studies ? Consideration for NF at some point if all signs point to this being a "reflex to reflux"  ? Anxiety / dx of exclusion   See instructions for specific recommendations which were reviewed directly with the patient who was given a copy with highlighter outlining the key components.

## 2014-11-29 NOTE — Assessment & Plan Note (Signed)
Calorie balance issues reviewed in detail See instructions for specific recommendations which were reviewed directly with the patient who was given a copy with highlighter outlining the key components.  

## 2014-11-29 NOTE — Progress Notes (Signed)
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    11/29/2014 1st [facility] office visit/ [place]   Chief Complaint  Patient presents with  . Pulmonary Consult    Referred by Jim Reaperegina Baity, NP. Pt c/o SOB and wheezing since Nov 2015. He states that he gets SOB with or without any exertion.   pt has know gerd with recent w/u in [place] included egd, no mention of needing NF, rec to continue nexium bid indefinitely  Spells of sob esp bad since Nov 2015 but date back decades/  can last secs to min, never used saba, assoc with choking sensation more common day than noct and assoc with noisy breathing > wheezing .  Assoc with wt gain   No obvious other patterns in day to day or daytime variabilty or assoc chronic cough or cp or chest tightness,   overt sinus or hb symptoms. No unusual exp hx or h/o childhood pna/ asthma or knowledge of premature birth.  Sleeping ok without nocturnal  or early am exacerbation  of respiratory  c/o's or need for noct saba. Also denies any obvious fluctuation of symptoms with weather or environmental changes or other aggravating or alleviating factors except as outlined above   Current Medications, Allergies, Complete Past Medical History, Past Surgical History, Family History, and Social History were reviewed in [place] electronic medical record.               Review of Systems  Constitutional: Negative for fever, chills, activity change, appetite change and unexpected weight change.  HENT:  Negative for congestion, dental problem, postnasal drip, rhinorrhea, sneezing, sore throat, trouble swallowing and voice change.   Eyes: Negative for visual disturbance.  Respiratory: Positive for shortness of breath. Negative for cough and choking.   Cardiovascular: Negative for chest pain and leg swelling.  Gastrointestinal: Negative for nausea, vomiting and abdominal pain.  Genitourinary: Negative for difficulty urinating.       Acid heartburn Indigestion  Musculoskeletal: Negative for arthralgias.  Skin: Negative for rash.  Psychiatric/Behavioral: Negative for behavioral problems and confusion.       Objective:   Physical Exam  amb bm  nad  Wt Readings from Last 3 Encounters:  11/29/14 304 lb (137.893 kg)  11/12/14 301 lb (136.533 kg)  10/13/14 290 lb (131.543 kg)    Vital signs reviewed   HEENT: nl dentition, turbinates, and orophanx. Nl external ear canals without cough reflex   NECK :  without JVD/Nodes/TM/ nl carotid upstrokes bilaterally   LUNGS: no acc muscle use, clear to A and P bilaterally without cough on insp or exp maneuvers   CV:  RRR  no s3 or murmur or increase in P2, no edema   ABD:  soft and nontender with nl excursion in the supine position. No bruits or organomegaly, bowel sounds nl  MS:  warm without deformities, calf tenderness, cyanosis or clubbing  SKIN: warm and dry without lesions    NEURO:  alert, approp, no deficits  .  I personally reviewed images and agree with radiology impression as follows:  CTa:  11/10/14 almance No pulmonary embolism or acute intrathoracic process identified.            Assessment & Plan:

## 2014-12-01 ENCOUNTER — Telehealth: Payer: Self-pay | Admitting: Internal Medicine

## 2014-12-01 NOTE — Telephone Encounter (Signed)
Records rec [facility]

## 2014-12-02 ENCOUNTER — Encounter: Payer: Self-pay | Admitting: Internal Medicine

## 2014-12-02 ENCOUNTER — Ambulatory Visit (INDEPENDENT_AMBULATORY_CARE_PROVIDER_SITE_OTHER): Payer: Self-pay | Admitting: Internal Medicine

## 2014-12-02 VITALS — BP 122/88 | HR 83 | Ht 71.0 in | Wt 302.0 lb

## 2014-12-02 DIAGNOSIS — I5032 Chronic diastolic (congestive) heart failure: Secondary | ICD-10-CM

## 2014-12-02 DIAGNOSIS — I471 Supraventricular tachycardia, unspecified: Secondary | ICD-10-CM | POA: Insufficient documentation

## 2014-12-02 DIAGNOSIS — I10 Essential (primary) hypertension: Secondary | ICD-10-CM

## 2014-12-02 DIAGNOSIS — E669 Obesity, unspecified: Secondary | ICD-10-CM

## 2014-12-02 DIAGNOSIS — Z950 Presence of cardiac pacemaker: Secondary | ICD-10-CM

## 2014-12-02 LAB — CUP PACEART INCLINIC DEVICE CHECK
Battery Impedance: 149 Ohm
Battery Remaining Longevity: 137 mo
Battery Voltage: 2.79 V
Brady Statistic AP VP Percent: 0 %
Brady Statistic AP VS Percent: 28 %
Brady Statistic AS VS Percent: 72 %
Date Time Interrogation Session: 20160512124155
Lead Channel Impedance Value: 371 Ohm
Lead Channel Impedance Value: 535 Ohm
Lead Channel Pacing Threshold Amplitude: 1.25 V
Lead Channel Pacing Threshold Pulse Width: 0.4 ms
MDC IDC MSMT LEADCHNL RA PACING THRESHOLD AMPLITUDE: 0.5 V
MDC IDC MSMT LEADCHNL RA PACING THRESHOLD PULSEWIDTH: 0.4 ms
MDC IDC MSMT LEADCHNL RA SENSING INTR AMPL: 2 mV
MDC IDC MSMT LEADCHNL RV SENSING INTR AMPL: 5.6 mV
MDC IDC SET LEADCHNL RA PACING AMPLITUDE: 2 V
MDC IDC SET LEADCHNL RV PACING AMPLITUDE: 2.5 V
MDC IDC SET LEADCHNL RV PACING PULSEWIDTH: 0.4 ms
MDC IDC SET LEADCHNL RV SENSING SENSITIVITY: 2 mV
MDC IDC STAT BRADY AS VP PERCENT: 0 %

## 2014-12-02 MED ORDER — CARTIA XT 180 MG PO CP24: 180.0000 mg | ORAL_CAPSULE | Freq: Every day | ORAL | Status: DC

## 2014-12-02 NOTE — Assessment & Plan Note (Signed)
He has had two ablations. We still do not have all of his records. 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 He will continue cardizem for his HTN.

## 2014-12-02 NOTE — Progress Notes (Signed)
HPI [name] returns today for follow-up. He is a very pleasant 44 year old man with a history of symptomatic bradycardia, status post permanent pacemaker [place] in 2006 [place]. I suspect he has autonomic dysfunction but this is not well documented. He underwent generator change in 2013. He has gained over 100 pounds in the last 20 years. He lives in [place] but works in [place]. He is currenly out on medical leave. He had a catheter ablation 6 weeks ago and returns for followup. The only records I have state he had no inducible SVT and got a slow pathway modification for his first procedure. He had more SVT and underwent a second procedure and we have no records about this one. He feels well and has had no recurrent symptomatic SVT. He feels well and is ready to go back to work.  No Known Allergies   Current Outpatient Prescriptions  Medication Sig Dispense Refill  . aspirin 81 MG chewable tablet Chew 81 mg by mouth daily.    Marland Kitchen. CARTIA XT 180 MG 24 hr capsule 180 mg daily.    Marland Kitchen. esomeprazole (NEXIUM) 40 MG capsule Take 1 capsule (40 mg total) by mouth daily. 30 capsule 2  . furosemide (LASIX) 10 MG/ML solution Take 40 mg by mouth daily.    . potassium chloride SA (K-DUR,KLOR-CON) 20 MEQ tablet Take 1 tablet (20 mEq total) by mouth daily. 30 tablet 6  . triamterene-hydrochlorothiazide (MAXZIDE-25) 37.5-25 MG per tablet Take 1 tablet by mouth daily.      No current facility-administered medications for this visit.     Past Medical History  Diagnosis Date  . Hypertension   . Chest pain     normal LHC 2008;  ETT-Echo 8/10: normal  . Obesity   . GERD (gastroesophageal reflux disease)   . Symptomatic bradycardia     s/p pacer in 2006 in [place]  . Other specified cardiac dysrhythmias(427.89)     ROS:   All systems reviewed and negative except as noted in the HPI.   Past Surgical History  Procedure Laterality Date  . Insert / replace / remove  pacemaker      status post pacemaker implant August 2006  . Appendectomy  1994  . Permanent pacemaker generator change N/A 04/16/2012    Procedure: PERMANENT PACEMAKER GENERATOR CHANGE;  Surgeon: [place] MawGregg W Tobe Kervin, MD;  [facility];  Service: Cardiovascular;  Laterality: N/A;     Family History  Problem Relation Age of Onset  . Hypertension Other   . Diabetes Other   . Cancer Other   . CAD Other   . Hypertension Maternal Grandmother   . Hypertension Maternal Grandfather   . Hypertension Paternal Grandmother   . Heart disease Paternal Grandfather   . Hypertension Paternal Grandfather      History   Social History  . Marital Status: Single    Spouse Name: N/A  . Number of Children: N/A  . Years of Education: N/A   Occupational History  . truck driver    Social History Main Topics  . Smoking status: Never Smoker   . Smokeless tobacco: Never Used  . Alcohol Use: 0.0 oz/week    0 Standard drinks or equivalent per week     Comment: occasional  . Drug Use: No  . Sexual Activity: Yes   Other Topics Concern  . Not on file   Social History Narrative   Tobacco history none. He does admit to using recreational  drugs in the past. He has not smoked marijuana in 7 years. 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 Both of his parents are in there 50 s and are  in good health.     BP 122/88 mmHg  Pulse 83  Ht 5\' 11"  (1.803 m)  Wt 302 lb (136.986 kg)  BMI 42.14 kg/m2  Physical Exam:  Well appearing 44 year old man, NAD HEENT: Unremarkable Neck:  No JVD, no thyromegally Lymphatics:  No adenopathy Back:  No CVA tenderness Lungs:  Clear, with no wheezes, rales, or rhonchi. HEART:  Regular rate rhythm, no murmurs, no rubs, no clicks Abd:  soft, positive bowel sounds, no organomegally, no rebound, no guarding Ext:  2 plus pulses, no edema, no cyanosis, no clubbing Skin:  No rashes no nodules Neuro:  CN II through XII intact,  motor grossly intact  DEVICE  Normal device function.  See PaceArt for details.   Assess/Plan:

## 2014-12-02 NOTE — Assessment & Plan Note (Signed)
His blood pressure is well controlled. He will continue his current meds. 

## 2014-12-02 NOTE — Assessment & Plan Note (Signed)
His device is working normally. Will recheck in several months. 

## 2014-12-02 NOTE — Patient Instructions (Addendum)
Medication Instructions:  Your physician recommends that you continue on your current medications as directed. Please refer to the Current Medication list given to you today.   Labwork: NONE  Testing/Procedures: NONE  Follow-Up: Your physician wants you to follow-up in: 6 months with Dr. Taylor. You will receive a reminder letter in the mail two months in advance. If you don't receive a letter, please call our office to schedule the follow-up appointment.   Any Other Special Instructions Will Be Listed Below (If Applicable).   

## 2014-12-02 NOTE — Assessment & Plan Note (Signed)
He is currently back on his diuretic and class 1. He is encouraged to lose weight and we will ask that he starts back exercising. Will see him back in several months.

## 2014-12-02 NOTE — Assessment & Plan Note (Signed)
The importance of dietary restriction and weight loss has been emphasized.  Hopefully he can lose weight.

## 2014-12-06 ENCOUNTER — Telehealth: Payer: Self-pay | Admitting: Internal Medicine

## 2014-12-06 NOTE — Telephone Encounter (Signed)
I have not seen them

## 2014-12-06 NOTE — Telephone Encounter (Signed)
Spoke with pt-wants to know if we have received records from [facility]  Pt signed release of information on 11/29/14  Please advise if these records have been received. Thanks.

## 2014-12-06 NOTE — Telephone Encounter (Signed)
LMTCB

## 2014-12-06 NOTE — Telephone Encounter (Signed)
Pt is returning call - [phone]

## 2014-12-06 NOTE — Telephone Encounter (Signed)
Pt aware  He will call their office and check with them  Nothing further needed

## 2014-12-06 NOTE — Telephone Encounter (Signed)
I have not seen them have you Dr Sherene SiresWert?

## 2014-12-08 ENCOUNTER — Telehealth: Payer: Self-pay | Admitting: Internal Medicine

## 2014-12-08 ENCOUNTER — Ambulatory Visit: Payer: BLUE CROSS/BLUE SHIELD | Admitting: Pulmonary Disease

## 2014-12-08 NOTE — Telephone Encounter (Signed)
New problem   Pt want to know if there is another alternative for the medication Cartia because this is very expensive. Please advise pt

## 2014-12-08 NOTE — Telephone Encounter (Signed)
Pt calling to ask Dr Ladona Ridgelaylor if there is another alternative medication for Devoria Albeartia XT, for the pts insurance just changed over to [place] and the price of this medication will double.  Pt states he will not be able to afford this medication when he is up for a refill.  Pt would like for Dr Ladona Ridgelaylor to advise on a different medication regimen in place of the Cartia, that's affordable.  Informed the pt that Dr Ladona Ridgelaylor is out of the office this afternoon but him and his nurse will return tomorrow.  Informed the pt that I will route this message to the both of them for further recommendation on an alternative medication and follow-up with the pt thereafter. Pt verbalized understanding and agrees with this plan.

## 2014-12-13 ENCOUNTER — Ambulatory Visit (INDEPENDENT_AMBULATORY_CARE_PROVIDER_SITE_OTHER): Payer: Self-pay | Admitting: Pulmonary Disease

## 2014-12-13 ENCOUNTER — Encounter: Payer: Self-pay | Admitting: Pulmonary Disease

## 2014-12-13 VITALS — BP 122/76 | HR 86 | Temp 97.0°F | Ht 71.0 in | Wt 301.0 lb

## 2014-12-13 DIAGNOSIS — G4733 Obstructive sleep apnea (adult) (pediatric): Secondary | ICD-10-CM

## 2014-12-13 NOTE — Progress Notes (Signed)
   Subjective:    Patient ID: [place], male    DOB: 04/17/1971, 44 y.o.   MRN: [id number]  HPI The patient comes in today for follow-up of his obstructive sleep apnea. He was started on C Pap at the last visit, and feels he is sleeping better with improved daytime alertness with the device. He is only wearing about 62% of the nights, and is only averaging about 4 hours of sleep a night. He tells me that he is staying on watching the basketball playoffs. He feels the machine is helping him, and would like to continue with this. His download shows excellent control of his AHI with no significant mask leak.   Review of Systems  Constitutional: Negative for fever and unexpected weight change.  HENT: Negative for congestion, dental problem, ear pain, nosebleeds, postnasal drip, rhinorrhea, sinus pressure, sneezing, sore throat and trouble swallowing.   Eyes: Negative for redness and itching.  Respiratory: Negative for cough, chest tightness, shortness of breath and wheezing.   Cardiovascular: Negative for palpitations and leg swelling.  Gastrointestinal: Negative for nausea and vomiting.  Genitourinary: Negative for dysuria.  Musculoskeletal: Negative for joint swelling.  Skin: Negative for rash.  Neurological: Negative for headaches.  Hematological: Does not bruise/bleed easily.  Psychiatric/Behavioral: Negative for dysphoric mood. The patient is not nervous/anxious.        Objective:   Physical Exam Obese male in no acute distress Nose without purulence or discharge noted Neck without lymphadenopathy or thyromegaly No skin breakdown or pressure necrosis from the C Pap mask Lower extremities without edema, no cyanosis Alert and oriented, moves all 4 extremities.        Assessment & Plan:

## 2014-12-13 NOTE — Assessment & Plan Note (Signed)
The pt is wearing cpap about 65% of the time, but is only getting about 4hrs a night of sleep.  His download shows excellent control of his sleep apnea with the current setting, and no significant mask leak.  He feels the device has made a difference in his sleep and daytime alertness, and would like to continue. I will keep him at the same pressure, and have asked him to try and increase his compliance to 6 hours a night. Also encouraged him to work aggressively on weight loss.

## 2014-12-13 NOTE — Patient Instructions (Signed)
Stay on cpap, and try to increase hours each night. Work on weight loss followup in 6mos with Dr. Craige CottaSood, but call if issues with your cpap.

## 2014-12-17 NOTE — Telephone Encounter (Signed)
Spoke with pharmacist and the Diltiazem ER 180 mg daily is $35.82 a month.  She says we could give him regular Diltiazem 90 mg bid and it would be $15.53 a month.  I have left it as the daily XR and will discuss with Dr Ladona Ridgelaylor next week if he would consider the short acting twice daily.  Patient aware of prices

## 2014-12-21 ENCOUNTER — Telehealth: Payer: Self-pay | Admitting: Internal Medicine

## 2014-12-21 DIAGNOSIS — R0602 Shortness of breath: Secondary | ICD-10-CM

## 2014-12-21 NOTE — Telephone Encounter (Signed)
Pt having episodes of SOB, heart not racing since ablation-wants to be checked for nitro valve regurgitation--pls call [phone]

## 2014-12-21 NOTE — Telephone Encounter (Signed)
Discussed with Dr Ladona Ridgelaylor.  Will order and echo and follow up with Gypsy BalsamAmber Seiler, NP

## 2014-12-27 ENCOUNTER — Ambulatory Visit (HOSPITAL_COMMUNITY): Payer: Self-pay | Attending: Cardiovascular Disease

## 2014-12-27 ENCOUNTER — Other Ambulatory Visit: Payer: Self-pay

## 2014-12-27 DIAGNOSIS — I517 Cardiomegaly: Secondary | ICD-10-CM | POA: Insufficient documentation

## 2014-12-27 DIAGNOSIS — R0602 Shortness of breath: Secondary | ICD-10-CM

## 2014-12-29 ENCOUNTER — Telehealth: Payer: Self-pay | Admitting: Internal Medicine

## 2014-12-29 NOTE — Telephone Encounter (Signed)
New message ° ° ° ° ° °Calling to get echo results °

## 2014-12-29 NOTE — Telephone Encounter (Signed)
Rec'd from [facility] forward 26 pages to Dr.Wert

## 2014-12-29 NOTE — Telephone Encounter (Signed)
Study Conclusions  - Left ventricle: The cavity size was normal. Systolic function was normal. The estimated ejection fraction was in the range of 55% to 60%. Wall motion was normal; there were no regional wall motion abnormalities. Left ventricular diastolic function parameters were normal. - Right ventricle: The cavity size was mildly dilated. Wall thickness was normal.  EF--55-60%

## 2014-12-29 NOTE — Telephone Encounter (Signed)
Spoke with patient and his echo is normal.  He is aware

## 2014-12-30 ENCOUNTER — Encounter: Payer: BLUE CROSS/BLUE SHIELD | Admitting: Internal Medicine

## 2014-12-31 ENCOUNTER — Ambulatory Visit: Payer: Self-pay | Admitting: Internal Medicine

## 2014-12-31 DIAGNOSIS — Z0289 Encounter for other administrative examinations: Secondary | ICD-10-CM

## 2015-01-03 ENCOUNTER — Telehealth: Payer: Self-pay | Admitting: Internal Medicine

## 2015-01-03 NOTE — Telephone Encounter (Signed)
ROI faxed to [facility], records rec back Will give to [employer]

## 2015-01-06 ENCOUNTER — Telehealth: Payer: Self-pay | Admitting: Internal Medicine

## 2015-01-06 NOTE — Telephone Encounter (Signed)
New problem    Pt need to speak to someone concerning if his device is set up correctly. Please call pt.

## 2015-01-06 NOTE — Telephone Encounter (Signed)
Spoke w/ pt that transmission was received.

## 2015-01-10 ENCOUNTER — Ambulatory Visit: Payer: BLUE CROSS/BLUE SHIELD | Admitting: Internal Medicine

## 2015-01-25 ENCOUNTER — Ambulatory Visit: Payer: Self-pay | Admitting: Internal Medicine

## 2015-01-27 ENCOUNTER — Telehealth: Payer: Self-pay | Admitting: Internal Medicine

## 2015-01-27 NOTE — Telephone Encounter (Signed)
NEwMessage  Pt wanted to discuss options w/ Device clinic. Pt wanted to have device checked in aug after his new ins begins. Pt is aware he has remote check on 8/11 and a recall to see Dr. Ladona Ridgelaylor [place]. Please call back and discuss.

## 2015-01-27 NOTE — Telephone Encounter (Signed)
Spoke w/ pt and he stated that he wanted to cancel remote transmission in 8-16 and come to see MD in August instead of November. Transferred call to EP scheduler to have appt scheduled.

## 2015-01-31 ENCOUNTER — Other Ambulatory Visit: Payer: Self-pay

## 2015-01-31 ENCOUNTER — Emergency Department (HOSPITAL_COMMUNITY)
Admission: EM | Admit: 2015-01-31 | Discharge: 2015-01-31 | Disposition: A | Discharge status: Home / Self Care | Payer: Self-pay | Attending: Emergency Medicine | Admitting: Emergency Medicine

## 2015-01-31 ENCOUNTER — Encounter (HOSPITAL_COMMUNITY): Payer: Self-pay | Admitting: *Deleted

## 2015-01-31 DIAGNOSIS — K219 Gastro-esophageal reflux disease without esophagitis: Secondary | ICD-10-CM | POA: Insufficient documentation

## 2015-01-31 DIAGNOSIS — Z79899 Other long term (current) drug therapy: Secondary | ICD-10-CM | POA: Insufficient documentation

## 2015-01-31 DIAGNOSIS — I10 Essential (primary) hypertension: Secondary | ICD-10-CM | POA: Insufficient documentation

## 2015-01-31 DIAGNOSIS — Z7982 Long term (current) use of aspirin: Secondary | ICD-10-CM | POA: Insufficient documentation

## 2015-01-31 DIAGNOSIS — R200 Anesthesia of skin: Secondary | ICD-10-CM

## 2015-01-31 DIAGNOSIS — E669 Obesity, unspecified: Secondary | ICD-10-CM | POA: Insufficient documentation

## 2015-01-31 DIAGNOSIS — M79602 Pain in left arm: Secondary | ICD-10-CM | POA: Insufficient documentation

## 2015-01-31 LAB — CBC WITH DIFFERENTIAL/PLATELET
BASOS ABS: 0 10*3/uL (ref 0.0–0.1)
Basophils Relative: 0 % (ref 0–1)
EOS ABS: 0.1 10*3/uL (ref 0.0–0.7)
EOS PCT: 2 % (ref 0–5)
HEMATOCRIT: 38.4 % — AB (ref 39.0–52.0)
Hemoglobin: 12.6 g/dL — ABNORMAL LOW (ref 13.0–17.0)
LYMPHS PCT: 43 % (ref 12–46)
Lymphs Abs: 2.5 10*3/uL (ref 0.7–4.0)
MCH: 27.5 pg (ref 26.0–34.0)
MCHC: 32.8 g/dL (ref 30.0–36.0)
MCV: 83.7 fL (ref 78.0–100.0)
MONOS PCT: 10 % (ref 3–12)
Monocytes Absolute: 0.6 10*3/uL (ref 0.1–1.0)
Neutro Abs: 2.6 10*3/uL (ref 1.7–7.7)
Neutrophils Relative %: 45 % (ref 43–77)
PLATELETS: 203 10*3/uL (ref 150–400)
RBC: 4.59 MIL/uL (ref 4.22–5.81)
RDW: 13.4 % (ref 11.5–15.5)
WBC: 5.8 10*3/uL (ref 4.0–10.5)

## 2015-01-31 LAB — BASIC METABOLIC PANEL
ANION GAP: 9 (ref 5–15)
BUN: 17 mg/dL (ref 6–20)
CALCIUM: 8.6 mg/dL — AB (ref 8.9–10.3)
CO2: 25 mmol/L (ref 22–32)
CREATININE: 1.61 mg/dL — AB (ref 0.61–1.24)
Chloride: 103 mmol/L (ref 101–111)
GFR calc Af Amer: 59 mL/min — ABNORMAL LOW (ref >60)
GFR, EST NON AFRICAN AMERICAN: 51 mL/min — AB (ref >60)
GLUCOSE: 100 mg/dL — AB (ref 65–99)
Potassium: 3.5 mmol/L (ref 3.5–5.1)
Sodium: 137 mmol/L (ref 135–145)

## 2015-01-31 LAB — I-STAT TROPONIN, ED
TROPONIN I, POC: 0 ng/mL (ref 0.00–0.08)
Troponin i, poc: 0 ng/mL (ref 0.00–0.08)

## 2015-01-31 NOTE — ED Provider Notes (Signed)
CSN: [id number]     Arrival date & time 01/31/15  0044 History   First MD Initiated Contact with Patient 01/31/15 0114     Chief Complaint  Patient presents with  . arm numbness      (Consider location/radiation/quality/duration/timing/severity/associated sxs/prior Treatment) HPI Comments: Pt comes in with cc of L arm throbbing pain. Pt reports that at some point he had pain on his L leg, but then he started having some discomfort to the L arm and some numbness. His entire arm hurts. There is no chest pain. He has some numbness to his hand as well. There is no hx of similar sx. He has no exertional component to his pain. There is no hx of strokes/TIa. Pt denies numbness, tingling, weakness anywhere else.  PMHX of bradycardia - s/p pacemaker placement and OSA, GERD, HTN. Pt is not a smoker.   ROS 10 Systems reviewed and are negative for acute change except as noted in the HPI.     The history is provided by the patient.    Past Medical History  Diagnosis Date  . Hypertension   . Chest pain     normal LHC 2008;  ETT-Echo 8/10: normal  . Obesity   . GERD (gastroesophageal reflux disease)   . Symptomatic bradycardia     [place] in 2006 [place]  . Other specified cardiac dysrhythmias(427.89)    Past Surgical History  Procedure Laterality Date  . Insert / replace / remove pacemaker      status post pacemaker implant August 2006  . Appendectomy  1994  . Permanent pacemaker generator change N/A 04/16/2012    Procedure: PERMANENT PACEMAKER GENERATOR CHANGE;  Surgeon: Marinus MawGregg W Taylor, MD;  [facility];  Service: Cardiovascular;  Laterality: N/A;   Family History  Problem Relation Age of Onset  . Hypertension Other   . Diabetes Other   . Cancer Other   . CAD Other   . Hypertension Maternal Grandmother   . Hypertension Maternal Grandfather   . Hypertension Paternal Grandmother   . Heart disease Paternal Grandfather   . Hypertension Paternal Grandfather     History  Substance Use Topics  . Smoking status: Never Smoker   . Smokeless tobacco: Never Used  . Alcohol Use: 0.0 oz/week    0 Standard drinks or equivalent per week     Comment: occasional    Review of Systems  Neurological: Positive for numbness.  All other systems reviewed and are negative.     Allergies  Review of patient's allergies indicates no known allergies.  Home Medications   Prior to Admission medications   Medication Sig Start Date End Date Taking? Authorizing Provider  aspirin 81 MG chewable tablet Chew 81 mg by mouth daily.   Yes Historical Provider, MD  furosemide (LASIX) 10 MG/ML solution Take 40 mg by mouth daily.   Yes Historical Provider, MD  triamterene-hydrochlorothiazide (MAXZIDE-25) 37.5-25 MG per tablet Take 1 tablet by mouth daily.    Yes Historical Provider, MD  CARTIA XT 180 MG 24 hr capsule Take 1 capsule (180 mg total) by mouth daily. Patient not taking: Reported on 01/31/2015 12/02/14   Marinus MawGregg W Taylor, MD  esomeprazole (NEXIUM) 40 MG capsule Take 1 capsule (40 mg total) by mouth daily. Patient not taking: Reported on 01/31/2015 07/19/14   Lorre Munroeegina W Baity, NP  potassium chloride SA (K-DUR,KLOR-CON) 20 MEQ tablet Take 1 tablet (20 mEq total) by mouth daily. Patient not taking: Reported on 01/31/2015 07/15/14  Duke Salvia, MD   BP 121/70 mmHg  Pulse 72  Temp(Src) 97.9 F (36.6 C) (Oral)  Resp 20  SpO2 96% Physical Exam  Constitutional: He is oriented to person, place, and time. He appears well-developed.  HENT:  Head: Normocephalic and atraumatic.  Eyes: Conjunctivae and EOM are normal. Pupils are equal, round, and reactive to light.  Neck: Normal range of motion. Neck supple.  Cardiovascular: Normal rate, regular rhythm and intact distal pulses.   Pulmonary/Chest: Effort normal and breath sounds normal.  Abdominal: Soft. Bowel sounds are normal. He exhibits no distension. There is no tenderness. There is no rebound and no guarding.   Neurological: He is alert and oriented to person, place, and time. No cranial nerve deficit. Coordination normal.  Cerebellar exam is normal (finger to nose) Sensory exam normal for bilateral upper and lower extremities - and patient is able to discriminate between sharp and dull. Motor exam is 4+/5 NIHSS - 0  Skin: Skin is warm.  Nursing note and vitals reviewed.   ED Course  Procedures (including critical care time) Labs Review Labs Reviewed  CBC WITH DIFFERENTIAL/PLATELET - Abnormal; Notable for the following:    Hemoglobin 12.6 (*)    HCT 38.4 (*)    All other components within normal limits  BASIC METABOLIC PANEL - Abnormal; Notable for the following:    Glucose, Bld 100 (*)    Creatinine, Ser 1.61 (*)    Calcium 8.6 (*)    GFR calc non Af Amer 51 (*)    GFR calc Af Amer 59 (*)    All other components within normal limits  I-STAT TROPOININ, ED  I-STAT TROPOININ, ED    Imaging Review No results found.   EKG Interpretation None       Date: 01/31/2015  Rate: 76  Rhythm: normal sinus rhythm  QRS Axis: normal  Intervals: normal  ST/T Wave abnormalities: normal  Conduction Disutrbances: none  Narrative Interpretation: unremarkable    : strict return precautions discussed. Pt has no neuro deficits still and denies numbness at this time.  MDM   Final diagnoses:  Arm pain, anterior, left  Arm numbness left   Pt comes in with cc of numbness, L sided. His numbness actually has resolved, but he still has aching pain, so he came to the ER. The neck exam is benign. The shoulder exam is benign. Doubt impingement or shoulder arthritis. Vascular exam, neuro exam is normal. Will get trops x 2.  Pt has CAD hx in the family at age 75s - his HEAR score is 2 for the risk factors (obesity, HTN, Fam hx). He had an exercise tolerance test recently and he had done well at that time. Doubt TIA - but if TIA -ABCD2 score is < 4 and outpatient workup will be fine. He is on  aspirin.     Derwood Kaplan, MD 01/31/15 [phone]

## 2015-01-31 NOTE — Discharge Instructions (Signed)
We saw you in the ER for the arm pain. All of our cardiac workup is normal, including labs, EKG and chest X-RAY are normal. We are not sure what is causing your discomfort, but we feel comfortable sending you home at this time.  The workup in the ER is not complete, and you should follow up with your primary care doctor for further evaluation. As we discussed  - arm numbness and pain could be signs of several condition, including cardiac and mini strokes - and so see your doctor for possible further workup like stress test or carotid dopplers, spine testing etc. As discussed - any further testing is at the discretion of your primary care doctor - they are the experts for outpatient workup, not ER physicians.   Return to the ER if you have severe chest pain, shortness of breath, constant numbness, arm weakness.  Angina Pectoris Angina pectoris, often just called angina, is extreme discomfort in your chest, neck, or arm caused by a lack of blood in the middle and thickest layer of your heart wall (myocardium). It may feel like tightness or heavy pressure. It may feel like a crushing or squeezing pain. Some people say it feels like gas or indigestion. It may go down your shoulders, back, and arms. Some people may have symptoms other than pain. These symptoms include fatigue, shortness of breath, cold sweats, or nausea. There are four different types of angina:  Stable angina--Stable angina usually occurs in episodes of predictable frequency and duration. It usually is brought on by physical activity, emotional stress, or excitement. These are all times when the myocardium needs more oxygen. Stable angina usually lasts a few minutes and often is relieved by taking a medicine that can be taken under your tongue (sublingually). The medicine is called nitroglycerin. Stable angina is caused by a buildup of plaque inside the arteries, which restricts blood flow to the heart muscle (atherosclerosis).  Unstable  angina--Unstable angina can occur even when your body experiences little or no physical exertion. It can occur during sleep. It can also occur at rest. It can suddenly increase in severity or frequency. It might not be relieved by sublingual nitroglycerin. It can last up to 30 minutes. The most common cause of unstable angina is a blood clot that has developed on the top of plaque buildup inside a coronary artery. It can lead to a heart attack if the blood clot completely blocks the artery.  Microvascular angina--This type of angina is caused by a disorder of tiny blood vessels called arterioles. Microvascular angina is more common in women. The pain may be more severe and last longer than other types of angina pectoris.  Prinzmetal or variant angina--This type of angina pectoris usually occurs when your body experiences little or no physical exertion. It especially occurs in the early morning hours. It is caused by a spasm of your coronary artery. HOME CARE INSTRUCTIONS   Only take over-the-counter and prescription medicines as directed by your health care provider.  Stay active or increase your exercise as directed by your health care provider.  Limit strenuous activity as directed by your health care provider.  Limit heavy lifting as directed by your health care provider.  Maintain a healthy weight.  Learn about and eat heart-healthy foods.  Do not use any tobacco products including cigarettes, chewing tobacco or electronic cigarettes. SEEK IMMEDIATE MEDICAL CARE IF:  You experience the following symptoms:  Chest, neck, deep shoulder, or arm pain or discomfort that lasts  more than a few minutes.  Chest, neck, deep shoulder, or arm pain or discomfort that goes away and comes back, repeatedly.  Heavy sweating with discomfort, without a noticeable cause.  Shortness of breath or difficulty breathing.  Angina that does not get better after a few minutes of rest or after taking sublingual  nitroglycerin. These can all be symptoms of a heart attack, which is a medical emergency! Get medical help at once. Call your local emergency service (911 in [place] immediately. Do not  drive yourself to the hospital and do not  wait to for your symptoms to go away. MAKE SURE YOU:  Understand these instructions.  Will watch your condition.  Will get help right away if you are not doing well or get worse. Document Released: 07/09/2005 Document Revised: 07/14/2013 Document Reviewed: 11/10/2013 [facility] Patient Information 2015 [employer], [place]. This information is not intended to replace advice given to you by your health care provider. Make sure you discuss any questions you have with your health care provider.

## 2015-01-31 NOTE — ED Notes (Signed)
Pt states that his left arm was throbbing and aching around 9pm; pt states that his left leg began to hurt as well so he thought it was arthritis; pt states that the pain progressed to numbness to left arm and pt states that he feels disoriented; denies chest pain; pt states that the pain and numbness is left arm through left shoulder to back; pt feels short of breath as well

## 2015-02-11 ENCOUNTER — Telehealth: Payer: Self-pay | Admitting: Internal Medicine

## 2015-02-11 NOTE — Telephone Encounter (Signed)
Spoke with patient and let him know I would like for him to send a transmission, however he is on the road to [place] to pick up his daughter and forgot his box.  He will do this when he returns and I will call him back on Mon

## 2015-02-11 NOTE — Telephone Encounter (Signed)
New message   Patient c/o Palpitations:  High priority if patient c/o lightheadedness and shortness of breath.  1. How long have you been having palpitations? Today  2. Are you currently experiencing lightheadedness and shortness of breath?Little lightheaded that comes and goes; no shortness of breath  3. Have you checked your BP and heart rate? (document readings) No  4. Are you experiencing any other symptoms? Fatigue and nausea  Please call to discuss

## 2015-02-17 NOTE — Telephone Encounter (Signed)
Sinus tach 7/17 rate 159-163 at 11:15am.  Really need to keep hydrated on these hot days with being outside

## 2015-02-17 NOTE — Telephone Encounter (Signed)
°  1. Has your device fired? No ° °2. Is you device beeping? No ° °3. Are you experiencing draining or swelling at device site? No ° °4. Are you calling to see if we received your device transmission? Yes ° °5. Have you passed out? No ° °

## 2015-02-24 ENCOUNTER — Encounter: Payer: Self-pay | Admitting: Internal Medicine

## 2015-02-24 ENCOUNTER — Ambulatory Visit (INDEPENDENT_AMBULATORY_CARE_PROVIDER_SITE_OTHER): Payer: Self-pay | Admitting: Internal Medicine

## 2015-02-24 VITALS — BP 120/78 | HR 75 | Ht 71.0 in | Wt 303.2 lb

## 2015-02-24 DIAGNOSIS — I471 Supraventricular tachycardia: Secondary | ICD-10-CM

## 2015-02-24 DIAGNOSIS — E669 Obesity, unspecified: Secondary | ICD-10-CM

## 2015-02-24 DIAGNOSIS — I495 Sick sinus syndrome: Secondary | ICD-10-CM

## 2015-02-24 DIAGNOSIS — Z950 Presence of cardiac pacemaker: Secondary | ICD-10-CM

## 2015-02-24 DIAGNOSIS — G4733 Obstructive sleep apnea (adult) (pediatric): Secondary | ICD-10-CM

## 2015-02-24 LAB — CUP PACEART INCLINIC DEVICE CHECK
Battery Impedance: 173 Ohm
Battery Remaining Longevity: 127 mo
Battery Voltage: 2.79 V
Brady Statistic AP VP Percent: 0 %
Brady Statistic AS VP Percent: 0 %
Brady Statistic AS VS Percent: 80 %
Date Time Interrogation Session: 20160804091438
Lead Channel Impedance Value: 529 Ohm
Lead Channel Pacing Threshold Amplitude: 1.25 V
Lead Channel Pacing Threshold Pulse Width: 0.4 ms
Lead Channel Pacing Threshold Pulse Width: 0.4 ms
Lead Channel Sensing Intrinsic Amplitude: 2 mV
Lead Channel Setting Pacing Amplitude: 1.5 V
Lead Channel Setting Sensing Sensitivity: 2 mV
MDC IDC MSMT LEADCHNL RA IMPEDANCE VALUE: 381 Ohm
MDC IDC MSMT LEADCHNL RA PACING THRESHOLD AMPLITUDE: 0.5 V
MDC IDC MSMT LEADCHNL RV SENSING INTR AMPL: 5.6 mV
MDC IDC SET LEADCHNL RV PACING AMPLITUDE: 2.5 V
MDC IDC SET LEADCHNL RV PACING PULSEWIDTH: 0.4 ms
MDC IDC STAT BRADY AP VS PERCENT: 20 %

## 2015-02-24 MED ORDER — VERAPAMIL HCL ER 120 MG PO TBCR: 120.0000 mg | EXTENDED_RELEASE_TABLET | Freq: Every day | ORAL | Status: DC

## 2015-02-24 NOTE — Patient Instructions (Addendum)
Medication Instructions:  Your physician has recommended you make the following change in your medication:  1.  Discontinue Lasix 2.  Start  Verapamil 120 mg  daily   Labwork: NONE ordered  Testing/Procedures: NONE ordered  Follow-Up: Your physician wants you to follow-up in: 6 months with Dr. Ladona Ridgel.  You will receive a reminder letter in the mail two months in advance. If you don't receive a letter, please call our office to schedule the follow-up appointment.   Remote monitoring is used to monitor your Pacemaker from home. This monitoring reduces the number of office visits required to check your device to one time per year. It allows [place] to keep an eye on the functioning of your device to ensure it is working properly. You are scheduled for a device check from home on 05-26-15. You may send your transmission at any time that day. If you have a wireless device, the transmission will be sent automatically. After your physician reviews your transmission, you will receive a postcard with your next transmission date.     Any Other Special Instructions Will Be Listed Below (If Applicable).

## 2015-02-24 NOTE — Progress Notes (Signed)
HPI Mr. Follett returns today for follow-up. He is a very pleasant 44 year old man with a history of symptomatic bradycardia, status post permanent pacemaker insertion in 2006 in [place]. I suspect he has autonomic dysfunction but this is not well documented. He underwent generator change in 2013. He has gained over 100 pounds in the last 20 years. He lives in [place] but has worked in [place]. He had a second catheter ablation several months ago and returns for followup. The only records I have state he had no inducible SVT and got a slow pathway modification for his first procedure. He has returned to work. He is anxious. He still has problems with palpitations and chest and arm pain. No syncope. No Known Allergies   Current Outpatient Prescriptions  Medication Sig Dispense Refill  . aspirin 81 MG chewable tablet Chew 81 mg by mouth daily.    Marland Kitchen esomeprazole (NEXIUM) 40 MG capsule Take 1 capsule (40 mg total) by mouth daily. 30 capsule 2  . furosemide (LASIX) 40 MG tablet Take 40 mg by mouth daily.    . potassium chloride SA (K-DUR,KLOR-CON) 20 MEQ tablet Take 20 mEq by mouth daily. Pt is only taking "every now and then" (02/24/15)    . triamterene-hydrochlorothiazide (MAXZIDE-25) 37.5-25 MG per tablet Take 1 tablet by mouth daily.      No current facility-administered medications for this visit.     Past Medical History  Diagnosis Date  . Hypertension   . Chest pain     normal LHC 2008;  ETT-Echo 8/10: normal  . Obesity   . GERD (gastroesophageal reflux disease)   . Symptomatic bradycardia     s/p pacer in 2006 in [place]  . Other specified cardiac dysrhythmias(427.89)     ROS:   All systems reviewed and negative except as noted in the HPI.   Past Surgical History  Procedure Laterality Date  . Insert / replace / remove pacemaker      status post pacemaker implant August 2006  . Appendectomy  1994  . Permanent pacemaker generator change N/A 04/16/2012     Procedure: PERMANENT PACEMAKER GENERATOR CHANGE;  Surgeon: Marinus Maw, MD;  Location: [facility] CATH LAB;  Service: Cardiovascular;  Laterality: N/A;     Family History  Problem Relation Age of Onset  . Hypertension Other   . Diabetes Other   . Cancer Other   . CAD Other   . Hypertension Maternal Grandmother   . Hypertension Maternal Grandfather   . Hypertension Paternal Grandmother   . Heart disease Paternal Grandfather   . Hypertension Paternal Grandfather      History   Social History  . Marital Status: Single    Spouse Name: N/A  . Number of Children: N/A  . Years of Education: N/A   Occupational History  . truck driver    Social History Main Topics  . Smoking status: Never Smoker   . Smokeless tobacco: Never Used  . Alcohol Use: 0.0 oz/week    0 Standard drinks or equivalent per week     Comment: occasional  . Drug Use: No  . Sexual Activity: Yes   Other Topics Concern  . Not on file   Social History Narrative   Tobacco history none. He does admit to using recreational drugs in the past. He has not smoked marijuana in 7 years. 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 Both of his parents  are in there 50 s and are  in good health.     BP 120/78 mmHg  Pulse 75  Ht 5\' 11"  (1.803 m)  Wt 303 lb 3.2 oz (137.531 kg)  BMI 42.31 kg/m2  Physical Exam:  Well appearing 44 year old man, NAD HEENT: Unremarkable Neck:  6 cm JVD, no thyromegally Back:  No CVA tenderness Lungs:  Clear, with no wheezes, rales, or rhonchi. HEART:  Regular rate rhythm, no murmurs, no rubs, no clicks Abd:  soft, positive bowel sounds, no organomegally, no rebound, no guarding Ext:  2 plus pulses, no edema, no cyanosis, no clubbing Skin:  No rashes no nodules Neuro:  CN II through XII intact, motor grossly intact  DEVICE  Normal device function.  See PaceArt for details.   Assess/Plan:

## 2015-02-25 NOTE — Assessment & Plan Note (Signed)
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 Will recheck in several months. 

## 2015-02-25 NOTE — Assessment & Plan Note (Signed)
His monitor still picks up tachycardia although this may be sinus tachycardia. His symptoms are reasonably well controlled. I have asked the patient to uptitrate his calcium channel blocker.

## 2015-02-25 NOTE — Assessment & Plan Note (Signed)
The importance of increasing his physical activity and dietary restriction was emphasized. 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

## 2015-02-25 NOTE — Assessment & Plan Note (Signed)
He is morbidly obese. I have strongly encouraged the patient to lose weight.

## 2015-03-02 ENCOUNTER — Encounter: Payer: Self-pay | Admitting: Internal Medicine

## 2015-03-04 ENCOUNTER — Ambulatory Visit: Payer: Self-pay | Admitting: Internal Medicine

## 2015-03-21 ENCOUNTER — Ambulatory Visit: Payer: Self-pay | Admitting: Internal Medicine

## 2015-03-25 ENCOUNTER — Ambulatory Visit (INDEPENDENT_AMBULATORY_CARE_PROVIDER_SITE_OTHER): Payer: Self-pay | Admitting: Internal Medicine

## 2015-03-25 ENCOUNTER — Telehealth: Payer: Self-pay | Admitting: Internal Medicine

## 2015-03-25 ENCOUNTER — Encounter: Payer: Self-pay | Admitting: Internal Medicine

## 2015-03-25 VITALS — BP 134/84 | HR 87 | Temp 98.3°F | Wt 298.0 lb

## 2015-03-25 DIAGNOSIS — R109 Unspecified abdominal pain: Secondary | ICD-10-CM

## 2015-03-25 DIAGNOSIS — R5383 Other fatigue: Secondary | ICD-10-CM

## 2015-03-25 DIAGNOSIS — K219 Gastro-esophageal reflux disease without esophagitis: Secondary | ICD-10-CM

## 2015-03-25 DIAGNOSIS — R197 Diarrhea, unspecified: Secondary | ICD-10-CM

## 2015-03-25 DIAGNOSIS — R0602 Shortness of breath: Secondary | ICD-10-CM

## 2015-03-25 NOTE — Telephone Encounter (Signed)
New message      Pt states he has had episodes of sweating, fatigue and is lethargic.  He denies chest pain or sob.  Pt has a pacemaker. Please advise

## 2015-03-25 NOTE — Progress Notes (Signed)
Pre visit review using our clinic review tool, if applicable. No additional management support is needed unless otherwise documented below in the visit note. 

## 2015-03-25 NOTE — Telephone Encounter (Signed)
Spoke with patient and let him know that he should touch base with his PCP to determine if he needs blood work or needs to be seen.  He denies fever, chest pain, palpitations, and SOB.  Just lots of sweating and feeling lethargic.

## 2015-03-25 NOTE — Progress Notes (Signed)
Subjective:    Patient ID: Jim Hill, male    DOB: [birth year], 44 y.o.   MRN: [id number]  HPI  Pt presents to the clinic today with c/o fatigue, sweating, shortness of breath, abdominal cramping, diarrhea. He reports these symptoms started 2 weeks ago but got significantly worse. He went to the ED in [place] 2 weeks ago for the same and was given Dicyclomine to take. He reports it has helped for the abdominal cramping but not for any of the other symptoms. He denies nausea, vomiting or blood in his stool. His is taking Nexium daily for GERD but does not feel like it helps.  He does see cardiology, Dr. Ladona Ridgel. Note from 02/2015 reviewed. 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 He does have episodes of tachycardia but Dr. Ladona Ridgel feels this in anxiety related. He is on Triamterene HCT and Verapamil daily. ECG from 01/2015 was normal. Stress test from 10/2014 was normal. Echo from 12/2014 was normal with an EF of 65%.  He also sees pulmonology, Dr. Shelle Iron.Note from 11/2014 reviewed. He does have OSA and sleeps with CPAP. He does feel like he is sleeping better at night and has more energy during the day.  He has had a xray and CT angio of the chest, 10/2014 which were both normal. Dr. Shelle Iron does not feel that his shortness of breath is necessarily related to OSA, but could be more anxiety related or possible reflux.  The pt also reports he has see a GI doctor in the past as well and [facility] in [place]. He had a barium swallow, 09/2014 which showed moderate GERD with a small hiatal hernia. He had a EGD 11/2013 which showed mild esophagitis at the GE junction and again noted the hiatal hernia.  He is not sure what to do, or what is causing his symptoms. His most concerning symptoms are the reflux which he thinks is causing the SOB. This is complicated by the fact that he has gained 100 lbs in the last 20 years. He is also concerned about the abdominal cramping and diarrhea. Nothing seems to make  this happen, it just occurs intermittently. He does have periods of normal stools. He denies constipation. He denies blood in his stool. He reports that he does have a very stressful job.  Review of Systems      Past Medical History  Diagnosis Date  . Hypertension   . Chest pain     normal LHC 2008;  ETT-Echo 8/10: normal  . Obesity   . GERD (gastroesophageal reflux disease)   . Symptomatic bradycardia     s/p pacer in 2006 in [place]  . Other specified cardiac dysrhythmias(427.89)     Current Outpatient Prescriptions  Medication Sig Dispense Refill  . aspirin 81 MG chewable tablet Chew 81 mg by mouth daily.    Marland Kitchen dicyclomine (BENTYL) 10 MG capsule Take 10 mg by mouth 4 (four) times daily -  before meals and at bedtime.    Marland Kitchen esomeprazole (NEXIUM) 40 MG capsule Take 1 capsule (40 mg total) by mouth daily. 30 capsule 2  . potassium chloride SA (K-DUR,KLOR-CON) 20 MEQ tablet Take 20 mEq by mouth daily. Pt is only taking "every now and then" (02/24/15)    . triamterene-hydrochlorothiazide (MAXZIDE-25) 37.5-25 MG per tablet Take 1 tablet by mouth daily.     . verapamil (CALAN-SR) 120 MG CR tablet Take 1 tablet (120 mg total) by mouth daily. (Patient not taking: Reported on 03/25/2015) 90 tablet  3   No current facility-administered medications for this visit.    No Known Allergies  Family History  Problem Relation Age of Onset  . Hypertension Other   . Diabetes Other   . Cancer Other   . CAD Other   . Hypertension Maternal Grandmother   . Hypertension Maternal Grandfather   . Hypertension Paternal Grandmother   . Heart disease Paternal Grandfather   . Hypertension Paternal Grandfather     Social History   Social History  . Marital Status: Single    Spouse Name: N/A  . Number of Children: N/A  . Years of Education: N/A   Occupational History  . truck driver    Social History Main Topics  . Smoking status: Never Smoker   . Smokeless tobacco: Never Used  . Alcohol Use:  0.0 oz/week    0 Standard drinks or equivalent per week     Comment: occasional  . Drug Use: No  . Sexual Activity: Yes   Other Topics Concern  . Not on file   Social History Narrative   Tobacco history none. He does admit to using recreational drugs in the past. He has not smoked marijuana in 7 years. 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 Both of his parents are in there 50 s and are  in good health.     Constitutional: Pt reports fatigue. Denies fever, malaise, headache or abrupt weight changes.  Respiratory: Pt reports shortness of breath. Denies difficulty breathing, cough or sputum production.   Cardiovascular: Denies chest pain, chest tightness, palpitations or swelling in the hands or feet.  Gastrointestinal: Pt reports abdominal cramping and diarrhea. Denies bloating, constipation, or blood in the stool.  Neurological: Denies dizziness, difficulty with memory, difficulty with speech or problems with balance and coordination.  Psych: Denies anxiety, depression, SI/HI.  No other specific complaints in a complete review of systems (except as listed in HPI above).  Objective:   Physical Exam   BP 134/84 mmHg  Pulse 87  Temp(Src) 98.3 F (36.8 C) (Oral)  Wt 298 lb (135.172 kg)  SpO2 98%  Wt Readings from Last 3 Encounters:  03/25/15 298 lb (135.172 kg)  02/24/15 303 lb 3.2 oz (137.531 kg)  12/13/14 301 lb (136.533 kg)    General: Appears his stated age, obese in NAD. Cardiovascular: Normal rate and rhythm. S1,S2 noted.  No murmur, rubs or gallops noted.  Pulmonary/Chest: Normal effort and positive vesicular breath sounds. No respiratory distress. No wheezes, rales or ronchi noted.  Abdomen: Soft and nontender. Normal bowel sounds, no bruits noted. No distention or masses noted. Liver, spleen and kidneys non palpable.  BMET    Component Value Date/Time   NA 137 01/31/2015 0244   NA 138 11/10/2014 0106   K 3.5 01/31/2015  0244   K 3.5 11/10/2014 0106   CL 103 01/31/2015 0244   CL 108 11/10/2014 0106   CO2 25 01/31/2015 0244   CO2 26 11/10/2014 0106   GLUCOSE 100* 01/31/2015 0244   GLUCOSE 106* 11/10/2014 0106   BUN 17 01/31/2015 0244   BUN 16 11/10/2014 0106   CREATININE 1.61* 01/31/2015 0244   CREATININE 1.37* 11/10/2014 0106   CALCIUM 8.6* 01/31/2015 0244   CALCIUM 8.7* 11/10/2014 0106   GFRNONAA 51* 01/31/2015 0244   GFRNONAA >60 11/10/2014 0106   GFRNONAA 57* 07/29/2014 1506   GFRAA 59* 01/31/2015 0244   GFRAA >60 11/10/2014 0106   GFRAA >60  07/29/2014 1506    Lipid Panel  No results found for: CHOL, TRIG, HDL, CHOLHDL, VLDL, LDLCALC  CBC    Component Value Date/Time   WBC 5.8 01/31/2015 0244   WBC 5.7 11/10/2014 0106   RBC 4.59 01/31/2015 0244   RBC 4.61 11/10/2014 0106   HGB 12.6* 01/31/2015 0244   HGB 12.7* 11/10/2014 0106   HCT 38.4* 01/31/2015 0244   HCT 38.5* 11/10/2014 0106   PLT 203 01/31/2015 0244   PLT 204 11/10/2014 0106   MCV 83.7 01/31/2015 0244   MCV 84 11/10/2014 0106   MCH 27.5 01/31/2015 0244   MCH 27.5 11/10/2014 0106   MCHC 32.8 01/31/2015 0244   MCHC 32.9 11/10/2014 0106   RDW 13.4 01/31/2015 0244   RDW 14.5 11/10/2014 0106   LYMPHSABS 2.5 01/31/2015 0244   MONOABS 0.6 01/31/2015 0244   EOSABS 0.1 01/31/2015 0244   BASOSABS 0.0 01/31/2015 0244    Hgb A1C No results found for: HGBA1C      Assessment & Plan:   Fatigue, shortness of breath, GERD, abdominal cramping and diarrhea:  Notes from specialty providers reviewed Procedures from different specialist reviewed I think his SOB is partially r/t anxiety, but he disagrees and refuses treatment of this. He may get better improvement from switching from Nexium to Dexilant but he is not interested in this at this time Discussed how weight loss would like improve a lot of his symptoms - he seems to thing there is an underlying issue He should continue his CPAP at night Will refer to GI for further  evaluation  RTC as needed or if symptoms persist or worsen

## 2015-03-26 NOTE — Patient Instructions (Signed)
Gastroesophageal Reflux Disease, Adult Gastroesophageal reflux disease (GERD) happens when acid from your stomach flows up into the esophagus. When acid comes in contact with the esophagus, the acid causes soreness (inflammation) in the esophagus. Over time, GERD may create small holes (ulcers) in the lining of the esophagus. CAUSES   Increased body weight. This puts pressure on the stomach, making acid rise from the stomach into the esophagus.  Smoking. This increases acid production in the stomach.  Drinking alcohol. This causes decreased pressure in the lower esophageal sphincter (valve or ring of muscle between the esophagus and stomach), allowing acid from the stomach into the esophagus.  Late evening meals and a full stomach. This increases pressure and acid production in the stomach.  A malformed lower esophageal sphincter. Sometimes, no cause is found. SYMPTOMS   Burning pain in the lower part of the mid-chest behind the breastbone and in the mid-stomach area. This may occur twice a week or more often.  Trouble swallowing.  Sore throat.  Dry cough.  Asthma-like symptoms including chest tightness, shortness of breath, or wheezing. DIAGNOSIS  Your caregiver may be able to diagnose GERD based on your symptoms. In some cases, X-rays and other tests may be done to check for complications or to check the condition of your stomach and esophagus. TREATMENT  Your caregiver may recommend over-the-counter or prescription medicines to help decrease acid production. Ask your caregiver before starting or adding any new medicines.  HOME CARE INSTRUCTIONS   Change the factors that you can control. Ask your caregiver for guidance concerning weight loss, quitting smoking, and alcohol consumption.  Avoid foods and drinks that make your symptoms worse, such as:  Caffeine or alcoholic drinks.  Chocolate.  Peppermint or mint flavorings.  Garlic and onions.  Spicy foods.  Citrus fruits,  such as oranges, lemons, or limes.  Tomato-based foods such as sauce, chili, salsa, and pizza.  Fried and fatty foods.  Avoid lying down for the 3 hours prior to your bedtime or prior to taking a nap.  Eat small, frequent meals instead of large meals.  Wear loose-fitting clothing. Do not wear anything tight around your waist that causes pressure on your stomach.  Raise the head of your bed 6 to 8 inches with wood blocks to help you sleep. Extra pillows will not help.  Only take over-the-counter or prescription medicines for pain, discomfort, or fever as directed by your caregiver.  Do not take aspirin, ibuprofen, or other nonsteroidal anti-inflammatory drugs (NSAIDs). SEEK IMMEDIATE MEDICAL CARE IF:   You have pain in your arms, neck, jaw, teeth, or back.  Your pain increases or changes in intensity or duration.  You develop nausea, vomiting, or sweating (diaphoresis).  You develop shortness of breath, or you faint.  Your vomit is green, yellow, black, or looks like coffee grounds or blood.  Your stool is red, bloody, or black. These symptoms could be signs of other problems, such as heart disease, gastric bleeding, or esophageal bleeding. MAKE SURE YOU:   Understand these instructions.  Will watch your condition.  Will get help right away if you are not doing well or get worse. Document Released: 04/18/2005 Document Revised: 10/01/2011 Document Reviewed: 01/26/2011 ExitCare Patient Information 2015 [employer]. This information is not intended to replace advice given to you by your health care provider. Make sure you discuss any questions you have with your health care provider.  

## 2015-03-29 ENCOUNTER — Encounter: Payer: Self-pay | Admitting: Gastroenterology

## 2015-03-29 ENCOUNTER — Telehealth: Payer: Self-pay

## 2015-03-29 NOTE — Telephone Encounter (Signed)
Pt was seen on 03/25/15 by Nicki Reaper NP.

## 2015-03-29 NOTE — Telephone Encounter (Signed)
[facility] sent another notice today that was on the wrong MRN # for this pt; was sent to IT to be corrected. Thought the TH portal note transferred to this correct pt's chart. I don't think you need to do anything since you saw pt on 03/25/15 but had to send this TH report to you.

## 2015-03-29 NOTE — Telephone Encounter (Signed)
I am not sure what is needed here.

## 2015-03-29 NOTE — Telephone Encounter (Signed)
PLEASE NOTE: All timestamps contained within this report are represented as [place] Standard Time. CONFIDENTIALTY NOTICE: This fax transmission is intended only for the addressee. It contains information that is legally privileged, confidential or otherwise protected from use or disclosure. If you are not the intended recipient, you are strictly prohibited from reviewing, disclosing, copying using or disseminating any of this information or taking any action in reliance on or regarding this information. If you have received this fax in error, please notify us immediately by telephone so that we can arrange for its return to [place]. Phone: [phone], Toll-Free: [phone], Fax: [phone] Page: 1 of 2 Call Id: [id number] [facility] Day - Client TELEPHONE ADVICE RECORD [facility] Patient Name: Jim Hill Gender: Male DOB: 01/03/1971 Age: 44 Y 7 M 21 D Return Phone Number: [phone] (Primary) Address: City/State/Zip: [place] Client [facility] Day - Client Client Site  [facility] [place] - Day Physician Nicki Reaper Contact Type Call Call Type Triage / Clinical Relationship To Patient Self Appointment Disposition EMR Appointment Scheduled Info pasted into Epic Yes Return Phone Number [phone] (Primary) Chief Complaint BREATHING - shortness of breath or sounds breathless Initial Comment Caller states that has been sluggish, lethargic, and some days like is about to pass out and has no energy. sleeps with a cpap. feels a little short of breath. PreDisposition Call a family member Nurse Assessment Nurse: Josie Saunders, RN, Erskine Squibb Date/Time [name] Time): 03/25/2015 3:18:45 PM Confirm and document reason for call. If symptomatic, describe symptoms. ---Caller states that has been sluggish, lethargic, and some days like is about to pass out and has no energy. sleeps with a cpap. feels a little short of breath.  States has also been having sweating episodes. Intermittent back pain. Has spoke with cardiologist and referred to PCP. States foos is always coming up Has the patient traveled out of the country within the last 30 days? ---No Does the patient require triage? ---Yes Related visit to physician within the last 2 weeks? ---No Does the PT have any chronic conditions? (i.e. diabetes, asthma, etc.) ---Yes List chronic conditions. ---CPap dependent Has Pacemaker Hypertension Guidelines Guideline Title Affirmed Question Affirmed Notes Nurse Date/Time (Eastern Time) Weakness (Generalized) and Fatigue [1] MODERATE weakness (i.e., interferes with work, school, normal activities) AND [2] cause unknown (Exceptions: weakness with acute minor illness, or weakness from poor fluid intake) Josie Saunders, RN, Erskine Squibb 03/25/2015 3:21:18 PM PLEASE NOTE: All timestamps contained within this report are represented as [place] Standard Time. CONFIDENTIALTY NOTICE: This fax transmission is intended only for the addressee. It contains information that is legally privileged, confidential or otherwise protected from use or disclosure. If you are not the intended recipient, you are strictly prohibited from reviewing, disclosing, copying using or disseminating any of this information or taking any action in reliance on or regarding this information. If you have received this fax in error, please notify us immediately by telephone so that we can arrange for its return to [place]. Phone: [phone], Toll-Free: [phone], Fax: [phone] Page: 2 of 2 Call Id: [id number] Disp. Time [name] Time) Disposition Final User 03/25/2015 3:16:56 PM Send to Urgent Berniece Salines 03/25/2015 3:17:11 PM Send to Urgent Berniece Salines 03/25/2015 3:27:57 PM See Physician within 4 Hours (or PCP triage) Yes Josie Saunders, RN, Lyn Hollingshead Understands: Yes Disagree/Comply: Comply Care Advice Given Per Guideline SEE PHYSICIAN WITHIN 4 HOURS (or  PCP triage): * IF OFFICE WILL BE OPEN: You need to be seen within the  next 3 or 4 hours. Call your doctor's office now or as soon as it opens. BRING MEDICINES: * Please bring a list of your current medicines when you go to see the doctor. * It is also a good idea to bring the pill bottles too. This will help the doctor to make certain you are taking the right medicines and the right dose. CALL BACK IF: * You become worse. CARE ADVICE given per Weakness and Fatigue (Adult) guideline. After Care Instructions Given Call Event Type User Date / Time Description

## 2015-04-04 ENCOUNTER — Encounter: Payer: Self-pay | Admitting: Internal Medicine

## 2015-04-04 ENCOUNTER — Other Ambulatory Visit (INDEPENDENT_AMBULATORY_CARE_PROVIDER_SITE_OTHER): Payer: Self-pay

## 2015-04-04 ENCOUNTER — Ambulatory Visit (INDEPENDENT_AMBULATORY_CARE_PROVIDER_SITE_OTHER): Payer: Self-pay | Admitting: Internal Medicine

## 2015-04-04 VITALS — BP 128/82 | HR 79 | Ht 71.0 in | Wt 306.0 lb

## 2015-04-04 DIAGNOSIS — R0600 Dyspnea, unspecified: Secondary | ICD-10-CM

## 2015-04-04 DIAGNOSIS — E876 Hypokalemia: Secondary | ICD-10-CM

## 2015-04-04 LAB — BRAIN NATRIURETIC PEPTIDE: PRO B NATRI PEPTIDE: 3 pg/mL (ref 0.0–100.0)

## 2015-04-04 LAB — TSH: TSH: 0.86 u[IU]/mL (ref 0.35–4.50)

## 2015-04-04 LAB — BASIC METABOLIC PANEL
BUN: 14 mg/dL (ref 6–23)
CHLORIDE: 105 meq/L (ref 96–112)
CO2: 28 mEq/L (ref 19–32)
Calcium: 9 mg/dL (ref 8.4–10.5)
Creatinine, Ser: 1.28 mg/dL (ref 0.40–1.50)
GFR: 78.64 mL/min (ref >60.00)
Glucose, Bld: 89 mg/dL (ref 70–99)
POTASSIUM: 4 meq/L (ref 3.5–5.1)
SODIUM: 139 meq/L (ref 135–145)

## 2015-04-04 NOTE — Progress Notes (Signed)
Quick Note:  LMTCB ______ 

## 2015-04-04 NOTE — Progress Notes (Signed)
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    History of Present Illness  11/29/2014 1st [facility] office visit/ Chad Donoghue   Chief Complaint  Patient presents with  . Pulmonary Consult    Referred by Nicki Reaper, NP. Pt c/o SOB and wheezing since Nov 2015. He states that he gets SOB with or without any exertion.   pt has know gerd with recent w/u in [place] included egd, no mention of needing NF, rec to continue nexium bid indefinitely Spells of sob esp bad since Nov 2015 but date back decades/  can last secs to min, never used saba, assoc with choking sensation more common day than noct and assoc with noisy breathing > wheezing .  Assoc with wt gain  rec Nexium 40 mg Take 30- 60 min before your first and last meals of the day  We need your GI evaluation from [facility] faxed [phone]  GERD diet Cal balance    04/04/2015 f/u ov/[facility] re:  Chief Complaint  Patient presents with  . Follow-up    Pt states his breathing has slightly improved. He has noticed SOB is worse after eating. Still c/o wheezing.    Fast walking will bring on sob every time x years / worse p big meal   No obvious day to day or daytime variability or assoc chronic cough or cp or chest tightness, subjective wheeze or overt sinus or hb symptoms. No unusual exp hx or h/o childhood pna/ asthma or knowledge of premature birth.  Sleeping ok without nocturnal  or early am exacerbation  of respiratory  c/o's or need for noct saba. Also denies  any obvious fluctuation of symptoms with weather or environmental changes or other aggravating or alleviating factors except as outlined above   Current Medications, Allergies, Complete Past Medical History, Past Surgical History, Family History, and Social History were reviewed in [employer] record.  ROS  The following are not active complaints unless bolded sore throat, dysphagia, dental problems, itching, sneezing,  nasal congestion or excess/ purulent secretions, ear ache,   fever, chills, sweats, unintended wt loss, classically pleuritic or exertional cp, hemoptysis,  orthopnea pnd or leg swelling, presyncope, palpitations, abdominal pain, anorexia, nausea, vomiting, diarrhea  or change in bowel or bladder habits, change in stools or urine, dysuria,hematuria,  rash, arthralgias, visual complaints, headache, numbness, weakness or ataxia or problems with walking or coordination,  change in mood/affect or memory.              Objective:  Physical Exam  amb bm  nad   04/04/2015        306  Wt Readings from Last 3 Encounters:  11/29/14 304 lb (137.893 kg)  11/12/14 301 lb (136.533 kg)  10/13/14 290 lb (131.543 kg)    Vital signs reviewed   HEENT: nl dentition, turbinates, and orophanx. Nl external ear canals without cough reflex   NECK :  without JVD/Nodes/TM/ nl carotid upstrokes bilaterally  LUNGS: no acc muscle use, clear to A and P bilaterally without cough on insp or exp maneuvers   CV:  RRR  no s3 or murmur or increase in P2, no edema   ABD:  soft and nontender with nl excursion in the supine position. No bruits or organomegaly, bowel sounds nl  MS:  warm without deformities, calf tenderness, cyanosis or clubbing  SKIN: warm and dry without lesions    NEURO:  alert, approp, no deficits      I personally reviewed images and agree with radiology impression as follows:  CTa:  11/10/14 almance No pulmonary embolism or acute intrathoracic  process identified.      Labs ordered/ reviewed:  Lab 04/04/15 1027  NA 139  K 4.0  CL 105  CO2 28  BUN 14  CREATININE 1.28  GLUCOSE 89     Lab Results  Component Value Date   TSH 0.86 04/04/2015     Lab Results  Component Value Date   PROBNP 3.0 04/04/2015       Lab Results  Component Value Date   WBC 5.8 01/31/2015   HGB 12.6* 01/31/2015   HCT 38.4* 01/31/2015   MCV 83.7 01/31/2015   PLT 203 01/31/2015    Eos 0.1       Assessment & Plan:

## 2015-04-04 NOTE — Patient Instructions (Signed)
Weight control is simply a matter of calorie balance which needs to be tilted in your favor by eating less and exercising more.  To get the most out of exercise, you need to be continuously aware that you are short of breath, but never out of breath, for 30 minutes daily. As you improve, it will actually be easier for you to do the same amount of exercise  in  30 minutes so always push to the level where you are short of breath.  If this does not result in gradual weight reduction then I strongly recommend you see a nutritionist with a food diary x 2 weeks so that we can work out a negative calorie balance which is universally effective in steady weight loss programs.  Think of your calorie balance like you do your bank account where in this case you want the balance to go down so you must take in less calories than you burn up.  It's just that simple:  Hard to do, but easy to understand.  Good luck!   If not making progress with regular exercise call Almyra Free at [phone] to schedule cpst  Please remember to go to the lab   department downstairs for your tests - we will call you with the results when they are available.   Set up appt with Dr Craige Cotta next available to review your cpap issues

## 2015-04-05 ENCOUNTER — Telehealth: Payer: Self-pay | Admitting: Internal Medicine

## 2015-04-05 NOTE — Progress Notes (Signed)
Quick Note:  See phone note from 9.13.16. Will sign off. ______

## 2015-04-05 NOTE — Telephone Encounter (Signed)
Called and spoke to pt. Informed him of the results and recs per MW. Pt verbalized understanding and denied any further questions or concerns at this time.    Notes Recorded by Nyoka Cowden, MD on 04/04/2015 at 1:20 PM Call patient : Studies are unremarkable, no change in recs

## 2015-04-10 ENCOUNTER — Encounter: Payer: Self-pay | Admitting: Internal Medicine

## 2015-04-10 NOTE — Assessment & Plan Note (Signed)
Body mass index is 42.7 kg/(m^2).  Lab Results  Component Value Date   TSH 0.86 04/04/2015     Contributing to gerd tendency/ doe/reviewed need  achieve and maintain neg calorie balance > defer f/u primary care including intermittently monitoring thyroid status

## 2015-04-10 NOTE — Assessment & Plan Note (Addendum)
-   GXT 11/17/14 but did not reproduce symptoms - CTa 11/10/14 neg pe/ ILD  - Nov 27 2013 EGD pos esophagitis neg for Barrett's  - Esophagram 10/05/14 mod GE reflux/ small sliding HH - 04/04/2015  Walked RA x 3 laps @ 185 ft each stopped due to  End of study fast pace, no sob or desat     I had an extended final summary discussion with the patient reviewing all relevant studies completed to date and  lasting 15 to 20 minutes of a 25 minute visit on the following issues:    Continue to feel his main problem is gerd /obesity related and is scheduled to see Dr Craige Cotta for cpap but in the meantime rec regular ex and consider cpst with spirometry before and after in not continuing to note sign reconditioning benefits > f/u here is prn

## 2015-04-11 ENCOUNTER — Encounter: Payer: Self-pay | Admitting: Gastroenterology

## 2015-04-13 ENCOUNTER — Ambulatory Visit: Payer: Self-pay | Admitting: Gastroenterology

## 2015-04-26 ENCOUNTER — Telehealth: Payer: Self-pay | Admitting: Internal Medicine

## 2015-04-26 NOTE — Telephone Encounter (Signed)
New Message  Pt c/o of arm pain/numbness when he wakes up- said it also happens when driving. Please call back and discuss.

## 2015-04-26 NOTE — Telephone Encounter (Signed)
Called and let him know to follow up with his PCP and to call back if needed

## 2015-04-29 ENCOUNTER — Ambulatory Visit (INDEPENDENT_AMBULATORY_CARE_PROVIDER_SITE_OTHER): Payer: Self-pay | Admitting: Physician Assistant

## 2015-04-29 ENCOUNTER — Encounter: Payer: Self-pay | Admitting: Physician Assistant

## 2015-04-29 VITALS — BP 122/82 | HR 64 | Ht 69.29 in | Wt 303.5 lb

## 2015-04-29 DIAGNOSIS — R1906 Epigastric swelling, mass or lump: Secondary | ICD-10-CM

## 2015-04-29 DIAGNOSIS — R1310 Dysphagia, unspecified: Secondary | ICD-10-CM

## 2015-04-29 DIAGNOSIS — R1013 Epigastric pain: Secondary | ICD-10-CM

## 2015-04-29 DIAGNOSIS — K219 Gastro-esophageal reflux disease without esophagitis: Secondary | ICD-10-CM

## 2015-04-29 MED ORDER — RANITIDINE HCL 150 MG PO TABS: ORAL_TABLET | ORAL | Status: DC

## 2015-04-29 NOTE — Progress Notes (Signed)
Patient ID: Jim Hill, male   DOB: [birth year], 44 y.o.   MRN: [id number]   Subjective:    Patient ID: Jim Hill, male    DOB: May 21, 1971, 44 y.o.   MRN: [id number]  HPI  Jim Hill is a pleasant 44 year old African-American male new to GI today referred by pulmonary/Dr. Sharee Pimple for evaluation of dyspnea complaints and acid reflux. Patient has history of congestive heart failure with EF of 55-60%, is status post pacemaker for bradycardia and is felt to have autonomic dysfunction. He also has sleep apnea and morbid obesity. His had prior ablation for SVT. Patient is a long-distance Naval architect and states that after eating he frequently feels very full bloated and uncomfortable in his upper abdomen. He has frequent reflux and sour brash and says that he also has nocturnal reflux waking up at night with food and fluid up in his mouth. He also has occasional episodes of sensation of food lodging. He has been on Nexium 40 mg by mouth daily since April has not noticed a lot of improvement in his symptoms. He says he still having to take Tums frequently after eating. He has occasional abdominal cramping and had been given a prescription of Bentyl which she says he uses just very occasionally. Prior EGD and colonoscopy had been done in [place] by [facility] care in 2015. He have a copy of the endoscopy report which shows a 2 cm hiatal hernia and a short segment of inflammation in the distal esophagus. Biopsies were taken but do not have the path report currently. We also do not have a copy of the colonoscopy which the patient says he was told was normal. He is not on any regular aspirin or NSAIDs.  Review of Systems Pertinent positive and negative review of systems were noted in the above HPI section.  All other review of systems was otherwise negative.  Outpatient Encounter Prescriptions as of 04/29/2015  Medication Sig  . aspirin 81 MG chewable tablet Chew 81 mg by mouth daily.  Marland Kitchen dicyclomine (BENTYL) 10  MG capsule Take 10 mg by mouth as needed.   Marland Kitchen esomeprazole (NEXIUM) 40 MG capsule Take 1 capsule (40 mg total) by mouth daily.  . potassium chloride SA (K-DUR,KLOR-CON) 20 MEQ tablet Take 20 mEq by mouth daily. Pt is only taking "every now and then" (02/24/15)  . triamterene-hydrochlorothiazide (MAXZIDE-25) 37.5-25 MG per tablet Take 1 tablet by mouth daily.   . ranitidine (ZANTAC) 150 MG tablet Take 1 tab at bedtime, nightly.   No facility-administered encounter medications on file as of 04/29/2015.   No Known Allergies Patient Active Problem List   Diagnosis Date Noted  . Severe obesity (BMI >= 40) (HCC) 04/10/2015  . SVT (supraventricular tachycardia) (HCC) 12/02/2014  . Dyspnea 11/29/2014  . Weight gain 11/29/2014  . Palpitations 11/12/2014  . OSA (obstructive sleep apnea) 09/01/2014  . Chronic diastolic heart failure (HCC) 07/06/2014  . Chest pain 07/09/2011  . GERD (gastroesophageal reflux disease) 07/09/2011  . Pacemaker-Medtronic 03/30/2011  . Hypertension 03/30/2011   Social History   Social History  . Marital Status: Single    Spouse Name: N/A  . Number of Children: N/A  . Years of Education: N/A   Occupational History  . truck driver    Social History Main Topics  . Smoking status: Never Smoker   . Smokeless tobacco: Never Used  . Alcohol Use: 0.0 oz/week    0 Standard drinks or equivalent per week     Comment: occasional  .  Drug Use: No  . Sexual Activity: Yes   Other Topics Concern  . Not on file   Social History Narrative   Tobacco history none. He does admit to using recreational drugs in the past. He has not smoked marijuana in 7 years. 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 Both of his parents are in there 50 s and are  in good health.    [name] family history includes CAD in his other; Cancer in his other; Diabetes in his other; Heart disease in his paternal grandfather; Hypertension in his maternal  grandfather, maternal grandmother, other, paternal grandfather, and paternal grandmother; Pancreatic cancer in his paternal uncle; Prostate cancer in his paternal grandfather.      Objective:    Filed Vitals:   04/29/15 0941  BP: 122/82  Pulse: 64    Physical Exam  well-developed African-American male in no acute distress, pleasant blood pressure 122/82 pulse 64 height 5 foot 9 weight 303, BMI 44.4. HEENT; nontraumatic normocephalic EOMI PERRLA sclera anicteric, Supple ;no JVD, Cardiovascular ;regular rate and rhythm with S1-S2 no murmur or gallop, capillary clear bilaterally, Abdomen ;obese soft nontender nondistended bowel sounds are active there is no palpable mass or hepatosplenomegaly, Rectal ;exam not done, Ext;no clubbing cyanosis or edema skin warm and dry, Neuropsych; mood and affect appropriate       Assessment & Plan:   #1 44 yo morbidly obese AA male with chronic GERD, frequent sour brash, intermittent dysphagia and frequent excessive fullness, bloating in upper abd associated with sense of air hunger. #2 CHF #3 s/p pacemaker- bradycardia #4 OSA $5 HTN  Plan; Suspect his weight is adversely affecting a multitude  of sxs, including GERD Continue Nexium 40 mg po qam  Add Zantac 150 mg at bedtime  Strict antireflux regimen- elevate HOB Weight loss Obtain records form [facility] regarding colonoscopy etc Schedule for Upper abd [place]  Schedule for EGD with possible dilation  with Dr Leone Payor. Procedure discussed in detail with pt and he is agreeable to proceed    Sammuel Cooper PA-C 04/29/2015   Cc: Lorre Munroe, NP

## 2015-04-29 NOTE — Patient Instructions (Addendum)
Continue Nexium 40 mg every morning and Zantac 150 mg at bedtime. We sent a prescription to [facility], [address], [place]. Start a strict antireflux regimine. Try to lose some weight.   You have been scheduled for an abdominal ultrasound at [facility] (1st floor of hospital) on  Mon 05-02-2015 at 7:30 am . Please arrive 15 minutes prior to your appointment for registration. Make certain not to have anything to eat or drink 6 hours prior to your appointment. Should you need to reschedule your appointment, please contact radiology at [phone]. This test typically takes about 30 minutes to perform.   You have been scheduled for an endoscopy. Please follow written instructions given to you at your visit today. If you use inhalers (even only as needed), please bring them with you on the day of your procedure. Your physician has requested that you go to www.startemmi.com and enter the access code given to you at your visit today. This web site gives a general overview about your procedure. However, you should still follow specific instructions given to you by our office regarding your preparation for the procedure.

## 2015-05-02 ENCOUNTER — Ambulatory Visit (HOSPITAL_COMMUNITY)
Admission: RE | Admit: 2015-05-02 | Discharge: 2015-05-02 | Disposition: A | Discharge status: Home / Self Care | Payer: Self-pay | Source: Ambulatory Visit | Attending: Physician Assistant | Admitting: Physician Assistant

## 2015-05-02 DIAGNOSIS — R1310 Dysphagia, unspecified: Secondary | ICD-10-CM

## 2015-05-02 DIAGNOSIS — K219 Gastro-esophageal reflux disease without esophagitis: Secondary | ICD-10-CM

## 2015-05-02 DIAGNOSIS — R1906 Epigastric swelling, mass or lump: Secondary | ICD-10-CM

## 2015-05-02 DIAGNOSIS — R938 Abnormal findings on diagnostic imaging of other specified body structures: Secondary | ICD-10-CM | POA: Insufficient documentation

## 2015-05-02 DIAGNOSIS — R109 Unspecified abdominal pain: Secondary | ICD-10-CM | POA: Insufficient documentation

## 2015-05-02 DIAGNOSIS — R932 Abnormal findings on diagnostic imaging of liver and biliary tract: Secondary | ICD-10-CM | POA: Insufficient documentation

## 2015-05-02 DIAGNOSIS — R1013 Epigastric pain: Secondary | ICD-10-CM

## 2015-05-03 ENCOUNTER — Telehealth: Payer: Self-pay | Admitting: *Deleted

## 2015-05-03 NOTE — Telephone Encounter (Signed)
Spoke with patient and reviewed results again. 

## 2015-05-12 NOTE — Progress Notes (Signed)
Agree with Ms. Esterwood's assessment and plan. Carl [name], MD, FACG   

## 2015-05-20 ENCOUNTER — Telehealth: Payer: Self-pay | Admitting: Internal Medicine

## 2015-05-20 NOTE — Telephone Encounter (Signed)
New message  Pt c/o of Chest Pressure:  1. Are you having Chest Pressure right now? No... But it does feel like some sort of heaviness.   2. Are you experiencing any other symptoms (ex. SOB, nausea, vomiting, sweating)? No  3. How long have you been experiencing Chest Pressure? About 1 week  4. Is your Chest Pressure continuous or coming and going? Come and go  5. Have you taken Nitroglycerin? No but he has taken triamterene-hydrochlorothiazide (MAXZIDE-25) 37.5-25   Comments: Pt called states that he feels like when he tries to work out, he feels the chest pressure and it is as if he cannot breathe as properly as he should. Pt also reports that when he lays down at night it feels like his heart is heavy. Requests a call back to discuss

## 2015-05-20 NOTE — Telephone Encounter (Signed)
Discussed with Dr Ladona Ridgelaylor, will have him seen by an extender and possibly get a stress test if warranted.  Will have Melissa call and schedule patient

## 2015-05-23 ENCOUNTER — Ambulatory Visit (INDEPENDENT_AMBULATORY_CARE_PROVIDER_SITE_OTHER): Payer: Self-pay | Admitting: Nurse Practitioner

## 2015-05-23 ENCOUNTER — Encounter: Payer: Self-pay | Admitting: Nurse Practitioner

## 2015-05-23 VITALS — BP 132/88 | HR 75 | Ht 71.0 in | Wt 303.0 lb

## 2015-05-23 DIAGNOSIS — G4733 Obstructive sleep apnea (adult) (pediatric): Secondary | ICD-10-CM

## 2015-05-23 DIAGNOSIS — I5032 Chronic diastolic (congestive) heart failure: Secondary | ICD-10-CM

## 2015-05-23 DIAGNOSIS — I471 Supraventricular tachycardia: Secondary | ICD-10-CM

## 2015-05-23 DIAGNOSIS — Z950 Presence of cardiac pacemaker: Secondary | ICD-10-CM

## 2015-05-23 DIAGNOSIS — R079 Chest pain, unspecified: Secondary | ICD-10-CM

## 2015-05-23 DIAGNOSIS — I10 Essential (primary) hypertension: Secondary | ICD-10-CM

## 2015-05-23 LAB — BASIC METABOLIC PANEL
BUN: 15 mg/dL (ref 7–25)
CO2: 26 mmol/L (ref 20–31)
Calcium: 9.1 mg/dL (ref 8.6–10.3)
Chloride: 105 mmol/L (ref 98–110)
Creat: 1.24 mg/dL (ref 0.60–1.35)
Glucose, Bld: 86 mg/dL (ref 65–99)
Potassium: 3.8 mmol/L (ref 3.5–5.3)
Sodium: 139 mmol/L (ref 135–146)

## 2015-05-23 LAB — CBC
HCT: 39.5 % (ref 39.0–52.0)
Hemoglobin: 13.2 g/dL (ref 13.0–17.0)
MCH: 27.3 pg (ref 26.0–34.0)
MCHC: 33.4 g/dL (ref 30.0–36.0)
MCV: 81.8 fL (ref 78.0–100.0)
MPV: 10.3 fL (ref 8.6–12.4)
Platelets: 227 10*3/uL (ref 150–400)
RBC: 4.83 MIL/uL (ref 4.22–5.81)
RDW: 14.1 % (ref 11.5–15.5)
WBC: 4.7 10*3/uL (ref 4.0–10.5)

## 2015-05-23 LAB — HEPATIC FUNCTION PANEL
ALT: 26 U/L (ref 9–46)
AST: 27 U/L (ref 10–40)
Albumin: 4 g/dL (ref 3.6–5.1)
Alkaline Phosphatase: 63 U/L (ref 40–115)
Bilirubin, Direct: 0.1 mg/dL (ref <=0.2)
Indirect Bilirubin: 0.5 mg/dL (ref 0.2–1.2)
Total Bilirubin: 0.6 mg/dL (ref 0.2–1.2)
Total Protein: 7.1 g/dL (ref 6.1–8.1)

## 2015-05-23 LAB — LIPID PANEL
Cholesterol: 139 mg/dL (ref 125–200)
HDL: 32 mg/dL — ABNORMAL LOW (ref >=40)
LDL Cholesterol: 77 mg/dL (ref <130)
Total CHOL/HDL Ratio: 4.3 Ratio (ref <=5.0)
Triglycerides: 152 mg/dL — ABNORMAL HIGH (ref <150)
VLDL: 30 mg/dL (ref <30)

## 2015-05-23 NOTE — Patient Instructions (Addendum)
We will be checking the following labs today - BMET, CBC, HPF and Lipids   Medication Instructions:    Continue with your current medicines.     Testing/Procedures To Be Arranged:  2 day stress Myoview  Follow-Up:   See Dr. Ladona Ridgelaylor back as planned - it will be sooner if you do not pass your stress test.     Other Special Instructions:   N/A    If you need a refill on your cardiac medications before your next appointment, please call your pharmacy.   Call the [facility] office at [phone] if you have any questions, problems or concerns.

## 2015-05-23 NOTE — Progress Notes (Signed)
CARDIOLOGY OFFICE NOTE  Date:  05/23/2015    Jim Hill Date of Birth: Aug 15, 1970 Medical Record #[id number]  PCP:  Nicki Reaper, NP  Cardiologist:  Ladona Ridgel    Chief Complaint  Patient presents with  . Chest Pain    Work in visit - seen for Dr. Ladona Ridgel    History of Present Illness: Jim Hill is a 44 y.o. male who presents today for a work in visit. Seen for Dr. Ladona Ridgel. He has a history of morbid obesity, reported diastolic HF, OSA, & symptomatic bradycardia, status post permanent pacemaker insertion back originally in 2006 in [facility]. Dr. Ladona Ridgel has suspected that he has autonomic dysfunction but this is not well documented. He underwent generator change in 2013. He has gained over 100 pounds in the last 20 years. He lives in [place] but has worked in [place]. He has had a second catheter ablation several months ago. The only records that Dr. Ladona Ridgel noted stated that he had no inducible SVT and got a slow pathway modification for his first procedure.   Last saw Dr. Ladona Ridgel back in May. Felt to be doing ok. Some elevated HR on device check - felt to be sinus tach - CCB dose was increased. Strongly encouraged to lose weight.   Phone call from last week - "Pt called states that he feels like when he tries to work out, he feels the chest pressure and it is as if he cannot breathe as properly as he should. Pt also reports that when he lays down at night it feels like his heart is heavy. Requests a call back to discuss"  Thus added to the FLEX for today.   Comes in today. Here alone. Says he "has so many symptoms". 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 Also with a "butterfly quivering feeling" - makes him a little dizzy. Has to pull over. Takes aspirin. Symptoms over a month - getting worse - not better. No exertional symptoms. Has pain in left leg. Pain in left arm. Says he has been working  out for the past month - this does not bring on any symptoms - feels better with working out. Does get a little dizzy if "works too hard". Grandfather on father's side with MI - ?early 67's. Not sure if father has had MI as well. No smoking. No lipids. He has had some GI symptoms and on multiple medicines. For EGD later this month. Seeing GI. Also sees pulmonary for his shortness of breath. Wearing his CPAP. Shortness of breath seems better.  He notes that he is quite worried - can't figure out "what is causing what". He is on multiple GI meds now. He never started Verapamil - says he has no issue with fast heart beat. For remote check later this week.    Past Medical History  Diagnosis Date  . Hypertension   . Chest pain     normal LHC 2008;  ETT-Echo 8/10: normal  . Obesity   . GERD (gastroesophageal reflux disease)   . Symptomatic bradycardia     s/p pacer in 2006 in [place]  . Other specified cardiac dysrhythmias(427.89)   . OSA (obstructive sleep apnea)   . SVT (supraventricular tachycardia) (HCC)   . Diastolic heart failure [facility])     Past Surgical History  Procedure Laterality Date  . Insert / replace / remove pacemaker  status post pacemaker implant August 2006  . Appendectomy  1994  . Permanent pacemaker generator change N/A 04/16/2012    Procedure: PERMANENT PACEMAKER GENERATOR CHANGE;  Surgeon: Marinus MawGregg W Taylor, MD;  [facility];  Service: Cardiovascular;  Laterality: N/A;     Medications: Current Outpatient Prescriptions  Medication Sig Dispense Refill  . aspirin 81 MG chewable tablet Chew 81 mg by mouth daily.    Marland Kitchen. dicyclomine (BENTYL) 10 MG capsule Take 10 mg by mouth as needed.     Marland Kitchen. esomeprazole (NEXIUM) 40 MG capsule Take 1 capsule (40 mg total) by mouth daily. 30 capsule 2  . potassium chloride SA (K-DUR,KLOR-CON) 20 MEQ tablet Take 20 mEq by mouth daily. Pt is only taking "every now and then" (02/24/15)    . ranitidine (ZANTAC) 150 MG tablet Take 1 tab at  bedtime, nightly. 30 tablet 11  . triamterene-hydrochlorothiazide (MAXZIDE-25) 37.5-25 MG per tablet Take 1 tablet by mouth daily.      No current facility-administered medications for this visit.    Allergies: No Known Allergies  Social History: The patient  reports that he has never smoked. He has never used smokeless tobacco. He reports that he drinks alcohol. He reports that he does not use illicit drugs.   Family History: The patient's family history includes CAD in his other; Cancer in his other; Diabetes in his other; Heart disease in his paternal grandfather; Hypertension in his maternal grandfather, maternal grandmother, other, paternal grandfather, and paternal grandmother; Pancreatic cancer in his paternal uncle; Prostate cancer in his paternal grandfather.   Review of Systems: Please see the history of present illness.   Otherwise, the review of systems is positive for shortness of breath with lying down, DOE, back pain, muscle pain, headaches, wheezing, chest pressure and sweating.   All other systems are reviewed and negative.   Physical Exam: VS:  BP 132/88 mmHg  Pulse 75  Ht 5\' 11"  (1.803 m)  Wt 303 lb (137.44 kg)  BMI 42.28 kg/m2 .  BMI Body mass index is 42.28 kg/(m^2).  Wt Readings from Last 3 Encounters:  05/23/15 303 lb (137.44 kg)  04/29/15 303 lb 8 oz (137.667 kg)  04/04/15 306 lb (138.801 kg)    General: Pleasant. Morbidly obese but in no acute distress.  HEENT: Normal. Neck: Supple, no JVD, carotid bruits, or masses noted.  Cardiac: Regular rate and rhythm. No murmurs, rubs, or gallops. No edema.  Respiratory:  Lungs are clear to auscultation bilaterally with normal work of breathing.  GI: Soft and nontender.  MS: No deformity or atrophy. Gait and ROM intact. Skin: Warm and dry. Color is normal.  Neuro:  Strength and sensation are intact and no gross focal deficits noted.  Psych: Alert, appropriate and with normal affect.   LABORATORY DATA:  EKG:   EKG is ordered today. This demonstrates NSR with inferior Q's - possible inferior MI  Lab Results  Component Value Date   WBC 5.8 01/31/2015   HGB 12.6* 01/31/2015   HCT 38.4* 01/31/2015   PLT 203 01/31/2015   GLUCOSE 89 04/04/2015   ALT 31 04/22/2013   AST 32 04/22/2013   NA 139 04/04/2015   K 4.0 04/04/2015   CL 105 04/04/2015   CREATININE 1.28 04/04/2015   BUN 14 04/04/2015   CO2 28 04/04/2015   TSH 0.86 04/04/2015    BNP (last 3 results)  Recent Labs  07/31/14 1525  BNP 6.4    ProBNP (last 3 results)  Recent  Labs  07/09/14 1253 04/04/15 1027  PROBNP 8.8 3.0     Other Studies Reviewed Today:  Echo Study Conclusions from 12/2014  - Left ventricle: The cavity size was normal. Systolic function was normal. The estimated ejection fraction was in the range of 55% to 60%. Wall motion was normal; there were no regional wall motion abnormalities. Left ventricular diastolic function parameters were normal. - Right ventricle: The cavity size was mildly dilated. Wall thickness was normal.   Assessment/Plan:  1. Chest pain - strong FH for CAD. EKG abnormal. Will arrange for 2 day Myoview. Now on multiple GI meds. Seeing GI as well. Further disposition to follow.   2. Underlying PPM  3. History of SVT with ablation x 2 - not on his CCB therapy - says the fast heart beat is no longer an issue.   4. Morbid obesity  5. PPM in place - device site looks stable.   Current medicines are reviewed with the patient today.  The patient does not have concerns regarding medicines other than what has been noted above.  The following changes have been made:  See above.  Labs/ tests ordered today include:    Orders Placed This Encounter  Procedures  . Basic metabolic panel  . CBC  . Hepatic function panel  . Lipid panel  . Myocardial Perfusion Imaging  . EKG 12-Lead     Disposition:   Further disposition to follow.   Patient is agreeable to this plan  and will call if any problems develop in the interim.   Signed: Rosalio Macadamia, RN, ANP-C 05/23/2015 10:38 AM  [facility] [address] [place], [place]  [address] Phone: [phone] Fax: [phone]

## 2015-05-24 ENCOUNTER — Encounter: Payer: Self-pay | Admitting: Pulmonary Disease

## 2015-05-24 ENCOUNTER — Ambulatory Visit (INDEPENDENT_AMBULATORY_CARE_PROVIDER_SITE_OTHER): Payer: Self-pay | Admitting: Pulmonary Disease

## 2015-05-24 VITALS — BP 138/82 | HR 78 | Ht 71.0 in | Wt 305.0 lb

## 2015-05-24 DIAGNOSIS — Z9989 Dependence on other enabling machines and devices: Secondary | ICD-10-CM

## 2015-05-24 DIAGNOSIS — R0600 Dyspnea, unspecified: Secondary | ICD-10-CM

## 2015-05-24 DIAGNOSIS — G4733 Obstructive sleep apnea (adult) (pediatric): Secondary | ICD-10-CM

## 2015-05-24 DIAGNOSIS — Z6841 Body Mass Index (BMI) 40.0 and over, adult: Secondary | ICD-10-CM

## 2015-05-24 NOTE — Patient Instructions (Signed)
Will schedule pulmonary function test Follow up in 6 months 

## 2015-05-24 NOTE — Progress Notes (Signed)
Chief Complaint  Patient presents with  . Follow-up    Former [facility] pt following for OSA: pt states he is well, sometimes he feels like his CPAP helps and sometimes he feels like it doesnt. pt using CPAP everynight for about 5 - 8 hours. pt unsure is mask and pressure is set well for him as far as being affective. DME: [employer]     History of Present Illness: Jim BlalockDuane Hill is a 44 y.o. male with OSA and dyspnea.  He was previously followed by Dr. Shelle Ironlance for OSA, and had recent assessment by Dr. Sherene SiresWert for dyspnea.  He uses his CPAP most nights.  He is only getting about 5 to 6 hours sleep per night.  He has not issue with mask fit.  He gets winded with activity.  He will get "wheezing" when he exerts too much, but this goes away quickly when he sits.  He has never been dx with asthma.  He denies cough, sputum, chest pain, or weakness.  He denies leg swelling.  There is no hx of thrombo-embolic disease.     TESTS: PSG 10/13/14 >> AHI 7 CT chest 11/10/14 >> negative Echo 12/27/14 >> EF 55 to 60% Auto CPAP 04/20/15 to 05/19/15 >> used on 22 of 30 nights with average 5 hrs and 13 min.  Average AHI is 0.9 with median CPAP 9 cm H2O and 95 th percentile CPAP 11 cm H20.   PMhx >> HTN, GERD, Bradycardia s/p PM, SVT  Past surgical hx, Allergies, Family hx, Social hx all reviewed.   Physical Exam: BP 138/82 mmHg  Pulse 78  Ht 5\' 11"  (1.803 m)  Wt 305 lb (138.347 kg)  BMI 42.56 kg/m2  SpO2 98%  General - No distress ENT - No sinus tenderness, no oral exudate, no LAN, MP 3 Cardiac - s1s2 regular, no murmur Chest - No wheeze/rales/dullness Back - No focal tenderness Abd - Soft, non-tender Ext - No edema Neuro - Normal strength Skin - No rashes Psych - normal mood, and behavior   CMP Latest Ref Rng 05/23/2015 04/04/2015 01/31/2015  Glucose 65 - 99 mg/dL 86 89 161(W100(H)  BUN 7 - 25 mg/dL 15 14 17   Creatinine 0.60 - 1.35 mg/dL 9.601.24 4.541.28 0.98(J1.61(H)  Sodium 135 - 146 mmol/L 139 139 137  Potassium  3.5 - 5.3 mmol/L 3.8 4.0 3.5  Chloride 98 - 110 mmol/L 105 105 103  CO2 20 - 31 mmol/L 26 28 25   Calcium 8.6 - 10.3 mg/dL 9.1 9.0 1.9(J8.6(L)  Total Protein 6.1 - 8.1 g/dL 7.1 - -  Total Bilirubin 0.2 - 1.2 mg/dL 0.6 - -  Alkaline Phos 40 - 115 U/L 63 - -  AST 10 - 40 U/L 27 - -  ALT 9 - 46 U/L 26 - -    CBC Latest Ref Rng 05/23/2015 01/31/2015 11/10/2014  WBC 4.0 - 10.5 K/uL 4.7 5.8 5.7  Hemoglobin 13.0 - 17.0 g/dL 47.813.2 12.6(L) 12.7(L)  Hematocrit 39.0 - 52.0 % 39.5 38.4(L) 38.5(L)  Platelets 150 - 400 K/uL 227 203 204    Lab Results  Component Value Date   TSH 0.86 04/04/2015    ProBNP (last 3 results)  Recent Labs  07/09/14 1253 04/04/15 1027  PROBNP 8.8 3.0      Assessment/Plan:  Obstructive sleep apnea. He is compliant with CPAP and reports benefit. Plan: - continue auto CPAP  Insufficient sleep. Plan: - advised him to try getting 7 to 8 hours sleep per night  Obesity.  Plan: - discussed techniques to assist with weight loss  Dyspnea. Likely related to obesity and deconditioning. Plan: - discussed starting gradual exercise regimen - will get PFT's for completeness of evaluation  Hepatic steatosis noted on CT chest from April 2016. Plan: - advised him to f/u with his PCP    Outpatient Encounter Prescriptions as of 05/24/2015  Medication Sig Note  . aspirin 81 MG chewable tablet Chew 81 mg by mouth daily.   Marland Kitchen dicyclomine (BENTYL) 10 MG capsule Take 10 mg by mouth as needed.  04/29/2015: As needed  . esomeprazole (NEXIUM) 40 MG capsule Take 1 capsule (40 mg total) by mouth daily. 04/29/2015: As needed  . potassium chloride SA (K-DUR,KLOR-CON) 20 MEQ tablet Take 20 mEq by mouth daily. Pt is only taking "every now and then" (02/24/15)   . ranitidine (ZANTAC) 150 MG tablet Take 1 tab at bedtime, nightly.   . triamterene-hydrochlorothiazide (MAXZIDE-25) 37.5-25 MG per tablet Take 1 tablet by mouth daily.     No facility-administered encounter medications on file  as of 05/24/2015.    Coralyn Helling, MD [facility] Pager:  [phone]

## 2015-05-26 ENCOUNTER — Ambulatory Visit (INDEPENDENT_AMBULATORY_CARE_PROVIDER_SITE_OTHER): Payer: Self-pay | Admitting: *Deleted

## 2015-05-26 ENCOUNTER — Telehealth: Payer: Self-pay | Admitting: Cardiology

## 2015-05-26 DIAGNOSIS — I495 Sick sinus syndrome: Secondary | ICD-10-CM

## 2015-05-26 NOTE — Telephone Encounter (Signed)
Spoke with pt and reminded pt of remote transmission that is due today. Pt verbalized understanding.   

## 2015-05-27 NOTE — Progress Notes (Signed)
Remote pacemaker transmission.   

## 2015-06-01 ENCOUNTER — Telehealth (HOSPITAL_COMMUNITY): Payer: Self-pay

## 2015-06-01 NOTE — Telephone Encounter (Signed)
Patient given detailed instructions per Myocardial Perfusion Study Information Sheet for the test on 06-07-2015 at 1230. Patient notified to arrive 15 minutes early and that it is imperative to arrive on time for appointment to keep from having the test rescheduled.  If you need to cancel or reschedule your appointment, please call the office within 24 hours of your appointment. Failure to do so may result in Hill cancellation of your appointment, and Hill $50 no show fee. Patient verbalized understanding.Jim Hill     

## 2015-06-03 ENCOUNTER — Encounter: Payer: Self-pay | Admitting: Internal Medicine

## 2015-06-03 ENCOUNTER — Ambulatory Visit (AMBULATORY_SURGERY_CENTER): Payer: Self-pay | Admitting: Internal Medicine

## 2015-06-03 VITALS — BP 114/63 | HR 70 | Temp 96.2°F | Resp 20 | Ht 69.0 in | Wt 303.0 lb

## 2015-06-03 DIAGNOSIS — K2950 Unspecified chronic gastritis without bleeding: Secondary | ICD-10-CM

## 2015-06-03 DIAGNOSIS — K2970 Gastritis, unspecified, without bleeding: Secondary | ICD-10-CM

## 2015-06-03 DIAGNOSIS — R1013 Epigastric pain: Secondary | ICD-10-CM

## 2015-06-03 DIAGNOSIS — K2270 Barrett's esophagus without dysplasia: Secondary | ICD-10-CM

## 2015-06-03 DIAGNOSIS — K2990 Gastroduodenitis, unspecified, without bleeding: Secondary | ICD-10-CM

## 2015-06-03 DIAGNOSIS — R1310 Dysphagia, unspecified: Secondary | ICD-10-CM

## 2015-06-03 MED ORDER — SODIUM CHLORIDE 0.9 % IV SOLN: 500.0000 mL | INTRAVENOUS | Status: DC

## 2015-06-03 NOTE — Progress Notes (Signed)
Called to room to assist during endoscopic procedure.  Patient ID and intended procedure confirmed with present staff. Received instructions for my participation in the procedure from the performing physician.  

## 2015-06-03 NOTE — Progress Notes (Signed)
Discussion in recovery raises ? Of rumination syndrome Jim Booparl [name], MD, [facility]

## 2015-06-03 NOTE — Patient Instructions (Addendum)
I found some changes in the lining of the stomach and esophagus and took biopsies - will let you know what this means hen i get the results. No signs of cancer.  I appreciate the opportunity to care for you. Iva Booparl [name], MD, FACG YOU HAD AN ENDOSCOPIC PROCEDURE TODAY AT THE [facility]:   Refer to the procedure report that was given to you for any specific questions about what was found during the examination.  If the procedure report does not answer your questions, please call your gastroenterologist to clarify.  If you requested that your care partner not be given the details of your procedure findings, then the procedure report has been included in a sealed envelope for you to review at your convenience later.  YOU SHOULD EXPECT: Some feelings of bloating in the abdomen. Passage of more gas than usual.  Walking can help get rid of the air that was put into your GI tract during the procedure and reduce the bloating. If you had a lower endoscopy (such as a colonoscopy or flexible sigmoidoscopy) you may notice spotting of blood in your stool or on the toilet paper. If you underwent a bowel prep for your procedure, you may not have a normal bowel movement for a few days.  Please Note:  You might notice some irritation and congestion in your nose or some drainage.  This is from the oxygen used during your procedure.  There is no need for concern and it should clear up in a day or so.  SYMPTOMS TO REPORT IMMEDIATELY:    Following upper endoscopy (EGD)  Vomiting of blood or coffee ground material  New chest pain or pain under the shoulder blades  Painful or persistently difficult swallowing  New shortness of breath  Fever of 100F or higher  Black, tarry-looking stools  For urgent or emergent issues, a gastroenterologist can be reached at any hour by calling [phone].   DIET: Your first meal following the procedure should be a small meal and then it is ok to  progress to your normal diet. Heavy or fried foods are harder to digest and may make you feel nauseous or bloated.  Likewise, meals heavy in dairy and vegetables can increase bloating.  Drink plenty of fluids but you should avoid alcoholic beverages for 24 hours.  ACTIVITY:  You should plan to take it easy for the rest of today and you should NOT DRIVE or use heavy machinery until tomorrow (because of the sedation medicines used during the test).    FOLLOW UP: Our staff will call the number listed on your records the next business day following your procedure to check on you and address any questions or concerns that you may have regarding the information given to you following your procedure. If we do not reach you, we will leave a message.  However, if you are feeling well and you are not experiencing any problems, there is no need to return our call.  We will assume that you have returned to your regular daily activities without incident.  If any biopsies were taken you will be contacted by phone or by letter within the next 1-3 weeks.  Please call us at [phone] if you have not heard about the biopsies in 3 weeks.    SIGNATURES/CONFIDENTIALITY: You and/or your care partner have signed paperwork which will be entered into your electronic medical record.  These signatures attest to the fact that that the information above  on your After Visit Summary has been reviewed and is understood.  Full responsibility of the confidentiality of this discharge information lies with you and/or your care-partner.

## 2015-06-03 NOTE — Progress Notes (Signed)
Awake to RR 

## 2015-06-03 NOTE — Op Note (Signed)
[facility] [address] [place], [id number]   ENDOSCOPY PROCEDURE REPORT  PATIENT: Len BlalockWhitaker, Valiant  MR#: [id number] BIRTHDATE: Sep 08, 1970 , 44  yrs. old GENDER: male ENDOSCOPIST: Iva Booparl E Gessner, MD, [facility] PROCEDURE DATE:  06/03/2015 PROCEDURE:  EGD w/ biopsy ASA CLASS:     Class III INDICATIONS:  dysphagia and epigastric pain. MEDICATIONS: Propofol 150 mg IV and Monitored anesthesia care TOPICAL ANESTHETIC: none  DESCRIPTION OF PROCEDURE: After the risks benefits and alternatives of the procedure were thoroughly explained, informed consent was obtained.  The LB [id number] endoscope was introduced through the mouth and advanced to the second portion of the duodenum , Without limitations.  The instrument was slowly withdrawn as the mucosa was fully examined.    1) Mild granular and erythematous mucosal changes pre-pylorioc antrum - biopsied 2) two possible subcentimeter columnar tongues at GE junction - biopsied - ? Barrett's 3) Otherwise normal EGD.  Retroflexed views revealed no abnormalities.     The scope was then withdrawn from the patient and the procedure completed.  COMPLICATIONS: There were no immediate complications.  ENDOSCOPIC IMPRESSION: 1) Mild granular and erythematous mucosal changes pre-pylorioc antrum - biopsied 2) two possible subcentimeter columnar tongues at GE junction - biopsied - ? Barrett's 3) Otherwise normal EGD  RECOMMENDATIONS: Office will call with results   eSigned:  Iva Booparl E Gessner, MD, [facility] 06/03/2015 11:04 AM    CC:The Patient and Nicki Reaperegina Baity

## 2015-06-06 ENCOUNTER — Telehealth: Payer: Self-pay

## 2015-06-06 NOTE — Telephone Encounter (Signed)
  Follow up Call-  Call back number 06/03/2015  Post procedure Call Back phone  # [phone]  Permission to leave phone message Yes     Patient questions:  Do you have a fever, pain , or abdominal swelling? No. Pain Score  0 *  Have you tolerated food without any problems? Yes.    Have you been able to return to your normal activities? Yes.    Do you have any questions about your discharge instructions: Diet   No. Medications  No. Follow up visit  No.  Do you have questions or concerns about your Care? No.  Actions: * If pain score is 4 or above: No action needed, pain <4.

## 2015-06-07 ENCOUNTER — Ambulatory Visit (HOSPITAL_COMMUNITY): Payer: Self-pay | Attending: Cardiology

## 2015-06-07 DIAGNOSIS — G4733 Obstructive sleep apnea (adult) (pediatric): Secondary | ICD-10-CM

## 2015-06-07 DIAGNOSIS — I5032 Chronic diastolic (congestive) heart failure: Secondary | ICD-10-CM

## 2015-06-07 DIAGNOSIS — R079 Chest pain, unspecified: Secondary | ICD-10-CM

## 2015-06-07 DIAGNOSIS — I471 Supraventricular tachycardia, unspecified: Secondary | ICD-10-CM

## 2015-06-07 DIAGNOSIS — I10 Essential (primary) hypertension: Secondary | ICD-10-CM

## 2015-06-07 DIAGNOSIS — Z950 Presence of cardiac pacemaker: Secondary | ICD-10-CM

## 2015-06-07 MED ORDER — TECHNETIUM TC 99M SESTAMIBI GENERIC - CARDIOLITE
33.0000 | Freq: Once | INTRAVENOUS | Status: AC | PRN
  Administered 2015-06-07: 33 via INTRAVENOUS

## 2015-06-08 ENCOUNTER — Ambulatory Visit (HOSPITAL_COMMUNITY): Payer: Self-pay | Attending: Internal Medicine

## 2015-06-08 DIAGNOSIS — R0989 Other specified symptoms and signs involving the circulatory and respiratory systems: Secondary | ICD-10-CM

## 2015-06-08 LAB — MYOCARDIAL PERFUSION IMAGING
Estimated workload: 10.1 METS
Exercise duration (min): 8 min
Exercise duration (sec): 0 s
LV dias vol: 129 mL
LV sys vol: 63 mL
MPHR: 177 {beats}/min
Peak HR: 166 {beats}/min
Percent HR: 93 %
RATE: 0.38
Rest HR: 75 {beats}/min
SDS: 2
SRS: 1
SSS: 3
TID: 1.01

## 2015-06-08 MED ORDER — TECHNETIUM TC 99M SESTAMIBI GENERIC - CARDIOLITE
32.2000 | Freq: Once | INTRAVENOUS | Status: AC | PRN
  Administered 2015-06-08: 32.2 via INTRAVENOUS

## 2015-06-09 ENCOUNTER — Telehealth: Payer: Self-pay | Admitting: Nurse Practitioner

## 2015-06-09 NOTE — Telephone Encounter (Signed)
S/w pt to reassure test results of lexi

## 2015-06-09 NOTE — Telephone Encounter (Signed)
F/u   Pt returning RN phone call concerning myoview results.

## 2015-06-15 ENCOUNTER — Encounter: Payer: Self-pay | Admitting: Cardiology

## 2015-06-15 LAB — CUP PACEART REMOTE DEVICE CHECK
Brady Statistic AP VP Percent: 1 %
Brady Statistic AS VS Percent: 80 %
Date Time Interrogation Session: 20161103175322
Implantable Lead Location: 753860
Implantable Lead Model: 5076
Lead Channel Pacing Threshold Amplitude: 0.5 V
Lead Channel Pacing Threshold Amplitude: 1.25 V
Lead Channel Sensing Intrinsic Amplitude: 5.6 mV
Lead Channel Setting Pacing Pulse Width: 0.4 ms
Lead Channel Setting Sensing Sensitivity: 2.8 mV
MDC IDC LEAD IMPLANT DT: 20060823
MDC IDC LEAD IMPLANT DT: 20060823
MDC IDC LEAD LOCATION: 753859
MDC IDC MSMT BATTERY IMPEDANCE: 197 Ohm
MDC IDC MSMT BATTERY REMAINING LONGEVITY: 122 mo
MDC IDC MSMT BATTERY VOLTAGE: 2.78 V
MDC IDC MSMT LEADCHNL RA IMPEDANCE VALUE: 391 Ohm
MDC IDC MSMT LEADCHNL RA PACING THRESHOLD PULSEWIDTH: 0.4 ms
MDC IDC MSMT LEADCHNL RA SENSING INTR AMPL: 1.4 mV
MDC IDC MSMT LEADCHNL RV IMPEDANCE VALUE: 575 Ohm
MDC IDC MSMT LEADCHNL RV PACING THRESHOLD PULSEWIDTH: 0.4 ms
MDC IDC SET LEADCHNL RA PACING AMPLITUDE: 1.5 V
MDC IDC SET LEADCHNL RV PACING AMPLITUDE: 2.5 V
MDC IDC STAT BRADY AP VS PERCENT: 19 %
MDC IDC STAT BRADY AS VP PERCENT: 0 %

## 2015-06-19 ENCOUNTER — Encounter: Payer: Self-pay | Admitting: Internal Medicine

## 2015-06-19 DIAGNOSIS — K2270 Barrett's esophagus without dysplasia: Secondary | ICD-10-CM

## 2015-06-19 DIAGNOSIS — F9821 Rumination disorder of infancy: Secondary | ICD-10-CM | POA: Insufficient documentation

## 2015-06-19 HISTORY — DX: Barrett's esophagus without dysplasia: K22.70

## 2015-06-19 NOTE — Progress Notes (Signed)
Quick Note:  Call - bxs show some inflammation and intest metaplasia - Barrett's  1) EGD 2021 recall to be placed by [facility] (no letter) 2) Schedule 4 hr GES to evaluate regurgitation and vomiting sxs 3) get on books for REV me next avail   ______

## 2015-06-20 ENCOUNTER — Other Ambulatory Visit: Payer: Self-pay

## 2015-06-20 DIAGNOSIS — R112 Nausea with vomiting, unspecified: Secondary | ICD-10-CM

## 2015-06-30 ENCOUNTER — Telehealth: Payer: Self-pay

## 2015-07-01 ENCOUNTER — Other Ambulatory Visit: Payer: Self-pay | Admitting: *Deleted

## 2015-07-01 MED ORDER — TRIAMTERENE-HCTZ 37.5-25 MG PO TABS: 1.0000 | ORAL_TABLET | Freq: Every day | ORAL | Status: DC

## 2015-07-01 NOTE — Telephone Encounter (Signed)
Rx sent in

## 2015-07-01 NOTE — Telephone Encounter (Signed)
Ok to fill 

## 2015-07-01 NOTE — Telephone Encounter (Signed)
-----   Message from Joanette Gulaandice A Lancelot Alyea, CMA sent at 06/30/2015 5:31 PM EST -----    Pt requesting refill for triamt/hctz 37.5-25MG  once daily. I saw it on medication list but also saw it under discontinued medications. Ok to refill? Please advise.

## 2015-07-06 ENCOUNTER — Telehealth: Payer: Self-pay | Admitting: Internal Medicine

## 2015-07-06 NOTE — Telephone Encounter (Signed)
New message ° ° ° ° ° °Talk to the nurse about his medication °

## 2015-07-06 NOTE — Telephone Encounter (Signed)
[place] at [place] due to HTN and blurred vision.  I have asked him to let the MD see him and have the records forwarded to us via fax as to POC.

## 2015-07-08 ENCOUNTER — Encounter (HOSPITAL_COMMUNITY)
Admission: RE | Admit: 2015-07-08 | Discharge: 2015-07-08 | Disposition: A | Discharge status: Home / Self Care | Payer: Self-pay | Source: Ambulatory Visit | Attending: Internal Medicine | Admitting: Internal Medicine

## 2015-07-08 DIAGNOSIS — R112 Nausea with vomiting, unspecified: Secondary | ICD-10-CM | POA: Insufficient documentation

## 2015-07-08 DIAGNOSIS — K30 Functional dyspepsia: Secondary | ICD-10-CM

## 2015-07-08 HISTORY — DX: Functional dyspepsia: K30

## 2015-07-08 MED ORDER — TECHNETIUM TC 99M SULFUR COLLOID
2.0000 | Freq: Once | INTRAVENOUS | Status: AC | PRN
  Administered 2015-07-08: 2 via INTRAVENOUS

## 2015-07-09 ENCOUNTER — Encounter (HOSPITAL_COMMUNITY): Payer: Self-pay

## 2015-07-09 DIAGNOSIS — K30 Functional dyspepsia: Secondary | ICD-10-CM

## 2015-07-09 HISTORY — DX: Functional dyspepsia: K30

## 2015-07-09 NOTE — Progress Notes (Signed)
Quick Note:  He has delayed gastric emptying Please rx metaclopramide 10 mg qac # 90 w/ 2 RF REV me Feb please ______

## 2015-07-11 ENCOUNTER — Other Ambulatory Visit: Payer: Self-pay

## 2015-07-11 MED ORDER — METOCLOPRAMIDE HCL 10 MG PO TABS
10.0000 mg | ORAL_TABLET | Freq: Every day | ORAL | Status: DC
Start: 2015-07-11 — End: 2015-08-19

## 2015-07-13 ENCOUNTER — Encounter: Payer: Self-pay | Admitting: Internal Medicine

## 2015-07-13 ENCOUNTER — Ambulatory Visit (INDEPENDENT_AMBULATORY_CARE_PROVIDER_SITE_OTHER): Payer: Self-pay | Admitting: Internal Medicine

## 2015-07-13 VITALS — BP 114/80 | HR 90 | Ht 71.0 in | Wt 307.2 lb

## 2015-07-13 DIAGNOSIS — I471 Supraventricular tachycardia: Secondary | ICD-10-CM

## 2015-07-13 DIAGNOSIS — Z950 Presence of cardiac pacemaker: Secondary | ICD-10-CM

## 2015-07-13 LAB — CUP PACEART INCLINIC DEVICE CHECK
Battery Impedance: 198 Ohm
Battery Voltage: 2.78 V
Brady Statistic RV Percent Paced: 0 %
Implantable Lead Implant Date: 20060823
Implantable Lead Implant Date: 20060823
Implantable Lead Location: 753859
Implantable Lead Model: 5076
Lead Channel Impedance Value: 381 Ohm
Lead Channel Impedance Value: 543 Ohm
Lead Channel Setting Pacing Amplitude: 2.25 V
Lead Channel Setting Pacing Pulse Width: 0.4 ms
MDC IDC LEAD LOCATION: 753860
MDC IDC MSMT BATTERY REMAINING LONGEVITY: 131 mo
MDC IDC MSMT LEADCHNL RV PACING THRESHOLD AMPLITUDE: 1 V
MDC IDC MSMT LEADCHNL RV PACING THRESHOLD PULSEWIDTH: 0.4 ms
MDC IDC MSMT LEADCHNL RV SENSING INTR AMPL: 5.6 mV
MDC IDC SESS DTM: 20161221161038
MDC IDC SET LEADCHNL RV SENSING SENSITIVITY: 2.8 mV

## 2015-07-13 NOTE — Assessment & Plan Note (Signed)
He appears to have sinus tachycardia. I am not convinced he has any sustained SVT.

## 2015-07-13 NOTE — Assessment & Plan Note (Signed)
I have continued to encouraged the patient to lose weight.

## 2015-07-13 NOTE — Assessment & Plan Note (Signed)
Today we turned his PPM down to 30/min. I am not convinced he still needs a PPM. Ill see him back in several months. Will follow.

## 2015-07-13 NOTE — Patient Instructions (Addendum)
Medication Instructions: - Your physician recommends that you continue on your current medications as directed. Please refer to the Current Medication list given to you today.  Labwork: - None ordered  Procedures/Testing: - None  Ordered  Follow-Up: - Remote monitoring is used to monitor your Pacemaker of ICD from home. This monitoring reduces the number of office visits required to check your device to one time per year. It allows us to keep an eye on the functioning of your device to ensure it is working properly. You are scheduled for a device check from home on 10/12/15. You may send your transmission at any time that day. If you have a wireless device, the transmission will be sent automatically. After your physician reviews your transmission, you will receive a postcard with your next transmission date.  - Your physician wants you to follow-up in: 6 months with Dr. Ladona Ridgelaylor. You will receive a reminder letter in the mail two months in advance. If you don't receive a letter, please call our office to schedule the follow-up appointment.  Any Additional Special Instructions Will Be Listed Below (If Applicable).

## 2015-07-13 NOTE — Progress Notes (Signed)
HPI Mr. Hudgins returns today for follow-up. He is a very pleasant 44 year old man with a history of symptomatic bradycardia (?autonomic dysfunction), status post permanent pacemaker insertion in 2006 in [place]. I suspect he has autonomic dysfunction but this is not well documented. He underwent generator change in 2013. He has gained over 100 pounds in the last 20 years. He lives in [place] but has worked in [place] and now in [place]. He has had 2 catheter ablations in [place] in the past, despite not having any inducible sustained SVT.He has returned to work. He is anxious. He still has problems with palpitations and chest and arm pain and HA. He recently sought medical attention when he developed a HA associated with elevated blood pressure.  No syncope. No Known Allergies   Current Outpatient Prescriptions  Medication Sig Dispense Refill  . aspirin 81 MG chewable tablet Chew 81 mg by mouth daily.    Marland Kitchen esomeprazole (NEXIUM) 40 MG capsule Take 1 capsule (40 mg total) by mouth daily. 30 capsule 2  . metoCLOPramide (REGLAN) 10 MG tablet Take 1 tablet (10 mg total) by mouth daily before breakfast. 90 tablet 2  . potassium chloride SA (K-DUR,KLOR-CON) 20 MEQ tablet Take 20 mEq by mouth daily. Pt is only taking "every now and then" (02/24/15)    . ranitidine (ZANTAC) 150 MG tablet Take 1 tab at bedtime, nightly. 30 tablet 11  . triamterene-hydrochlorothiazide (MAXZIDE-25) 37.5-25 MG tablet Take 1 tablet by mouth daily. 30 tablet 9   No current facility-administered medications for this visit.     Past Medical History  Diagnosis Date  . Hypertension   . Chest pain     normal LHC 2008;  ETT-Echo 8/10: normal  . Obesity   . GERD (gastroesophageal reflux disease)   . Symptomatic bradycardia     s/p pacer in 2006 in [place]  . Other specified cardiac dysrhythmias(427.89)   . OSA (obstructive sleep apnea)   . SVT (supraventricular tachycardia) (HCC)   . Diastolic  heart failure (HCC)   . Sleep apnea   . Barrett's esophagus without dysplasia - subcentimeter changes 06/19/2015  . Delayed gastric emptying 07/09/2015    ROS:   All systems reviewed and negative except as noted in the HPI.   Past Surgical History  Procedure Laterality Date  . Insert / replace / remove pacemaker      status post pacemaker implant August 2006  . Appendectomy  1994  . Permanent pacemaker generator change N/A 04/16/2012    Procedure: PERMANENT PACEMAKER GENERATOR CHANGE;  Surgeon: Marinus Maw, MD;  Location: [facility] CATH LAB;  Service: Cardiovascular;  Laterality: N/A;  . Colonoscopy    . Upper gastrointestinal endoscopy       Family History  Problem Relation Age of Onset  . Hypertension Other   . Diabetes Other   . Cancer Other   . CAD Other   . Hypertension Maternal Grandmother   . Hypertension Maternal Grandfather   . Hypertension Paternal Grandmother   . Heart disease Paternal Grandfather   . Hypertension Paternal Grandfather   . Pancreatic cancer Paternal Uncle   . Prostate cancer Paternal Grandfather      Social History   Social History  . Marital Status: Single    Spouse Name: N/A  . Number of Children: N/A  . Years of Education: N/A   Occupational History  . truck driver    Social History Main Topics  . Smoking status: Never Smoker   .  Smokeless tobacco: Never Used  . Alcohol Use: 0.0 oz/week    0 Standard drinks or equivalent per week     Comment: occasional  . Drug Use: No  . Sexual Activity: Yes   Other Topics Concern  . Not on file   Social History Narrative   Tobacco history none. He does admit to using recreational drugs in the past. He has not smoked marijuana in 7 years. 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 Both of his parents are in there 50 s and are  in good health.     BP 114/80 mmHg  Pulse 90  Ht 5\' 11"  (1.803 m)  Wt 307 lb 3.2 oz (139.345 kg)  BMI 42.86 kg/m2  SpO2  94%  Physical Exam:  Obese appearing 44 year old man, NAD HEENT: Unremarkable Neck:  6 cm JVD, no thyromegally Back:  No CVA tenderness Lungs:  Clear, with no wheezes, rales, or rhonchi. HEART:  Regular rate rhythm, no murmurs, no rubs, no clicks Abd:  soft, positive bowel sounds, no organomegally, no rebound, no guarding Ext:  2 plus pulses, no edema, no cyanosis, no clubbing Skin:  No rashes no nodules Neuro:  CN II through XII intact, motor grossly intact  DEVICE  Normal device function.  See PaceArt for details.   Assess/Plan:

## 2015-07-14 NOTE — Telephone Encounter (Signed)
error 

## 2015-07-29 ENCOUNTER — Ambulatory Visit (INDEPENDENT_AMBULATORY_CARE_PROVIDER_SITE_OTHER): Payer: Self-pay | Admitting: Pulmonary Disease

## 2015-07-29 DIAGNOSIS — R0600 Dyspnea, unspecified: Secondary | ICD-10-CM

## 2015-07-29 LAB — PULMONARY FUNCTION TEST
DL/VA % pred: 111 %
DL/VA: 5.25 ml/min/mmHg/L
DLCO UNC % PRED: 88 %
DLCO UNC: 29.26 ml/min/mmHg
FEF 25-75 POST: 3.65 L/s
FEF 25-75 PRE: 3.43 L/s
FEF2575-%Change-Post: 6 %
FEF2575-%PRED-POST: 100 %
FEF2575-%PRED-PRE: 94 %
FEV1-%Change-Post: 2 %
FEV1-%Pred-Post: 98 %
FEV1-%Pred-Pre: 96 %
FEV1-POST: 3.51 L
FEV1-Pre: 3.43 L
FEV1FVC-%Change-Post: 2 %
FEV1FVC-%PRED-PRE: 101 %
FEV6-%CHANGE-POST: 0 %
FEV6-%PRED-POST: 97 %
FEV6-%Pred-Pre: 97 %
FEV6-Post: 4.18 L
FEV6-Pre: 4.18 L
FEV6FVC-%CHANGE-POST: 0 %
FEV6FVC-%Pred-Post: 102 %
FEV6FVC-%Pred-Pre: 101 %
FVC-%Change-Post: 0 %
FVC-%PRED-POST: 95 %
FVC-%Pred-Pre: 95 %
FVC-Post: 4.18 L
FVC-Pre: 4.18 L
POST FEV1/FVC RATIO: 84 %
PRE FEV1/FVC RATIO: 82 %
Post FEV6/FVC ratio: 100 %
Pre FEV6/FVC Ratio: 100 %
RV % PRED: 50 %
RV: 0.98 L
TLC % pred: 72 %
TLC: 5.1 L

## 2015-07-29 NOTE — Progress Notes (Signed)
PFT done today. 

## 2015-08-05 ENCOUNTER — Telehealth: Payer: Self-pay | Admitting: Pulmonary Disease

## 2015-08-05 NOTE — Telephone Encounter (Signed)
Pt had PFT on 07/29/15. He is requesting these results.  VS - please advise. Thanks.

## 2015-08-05 NOTE — Telephone Encounter (Signed)
PFT 07/29/15 >> FEV1 3.51 (98%), FEV1% 84, TLC 5.10 (72%), DLCO 88%, no BD   Please inform pt that breathing test was normal.

## 2015-08-05 NOTE — Telephone Encounter (Signed)
Spoke with pt, aware of results/recs.  Nothing further needed.  

## 2015-08-19 ENCOUNTER — Encounter: Payer: Self-pay | Admitting: Internal Medicine

## 2015-08-19 ENCOUNTER — Ambulatory Visit (INDEPENDENT_AMBULATORY_CARE_PROVIDER_SITE_OTHER): Payer: Self-pay | Admitting: Internal Medicine

## 2015-08-19 VITALS — BP 132/80 | HR 76 | Ht 69.29 in | Wt 308.0 lb

## 2015-08-19 DIAGNOSIS — G909 Disorder of the autonomic nervous system, unspecified: Secondary | ICD-10-CM

## 2015-08-19 DIAGNOSIS — K2100 Gastro-esophageal reflux disease with esophagitis, without bleeding: Secondary | ICD-10-CM

## 2015-08-19 DIAGNOSIS — K3184 Gastroparesis: Secondary | ICD-10-CM | POA: Insufficient documentation

## 2015-08-19 HISTORY — DX: Gastroparesis: K31.84

## 2015-08-19 MED ORDER — METOCLOPRAMIDE HCL 10 MG PO TABS: 10.0000 mg | ORAL_TABLET | Freq: Three times a day (TID) | ORAL | Status: DC

## 2015-08-19 NOTE — Patient Instructions (Addendum)
   Please follow Step # 3 of the gastroparesis diet handout given to you today.  We have sent the following medications to your pharmacy for you to pick up at your convenience: Reglan   We will work on getting you a referral to [facility].    I appreciate the opportunity to care for you. Stan Head, MD, [facility]

## 2015-08-19 NOTE — Progress Notes (Signed)
Subjective:    Patient ID: Jim Hill, male    DOB: September 03, 1970, 44 y.o.   MRN: [id number] Cc: Nausea regurgitation and reflux and abdominal cramps HPI Patient is here for follow-up. He originally saw [place] and saw Mike Gip [facility] with reflux complaints. His is in the setting of a pacemaker for suspected autonomic dysfunction, he also as sleep apnea and dyspnea problems. I did an endoscopy of the upper GI tract which demonstrated 2 subcentimeter tongues of columnar mucosa which showed intestinal metaplasia. Also had some gastritis but no H. pylori. He had changes of esophagitis and a 2 cm hiatal hernia on EGD in [place] in 2015 and a negative colonoscopy at that time.  I had him do a 4 hour gastric emptying study which was abnormal at 1, 3 and 4 hours he only had 68.6% emptying at 4 hour mark.   I had prescribed metoclopramide but there was a mixup and he is only taking it before breakfast, that's how the prescription got transcribed from my message to my nurse. He is not sure if his better. He still regurgitates doesn't really vomit. He will spit out the food. There is intermittent heartburn though that is better he says. He has mild crampy abdominal pain and frequent stools loose to formed. They're controllable. 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 No Known Allergies Outpatient Prescriptions Prior to Visit  Medication Sig Dispense Refill  . aspirin 81 MG chewable tablet Chew 81 mg by mouth daily.    Marland Kitchen esomeprazole (NEXIUM) 40 MG capsule Take 1 capsule (40 mg total) by mouth daily. 30 capsule 2  . potassium chloride SA (K-DUR,KLOR-CON) 20 MEQ tablet Take 20 mEq by mouth daily. Pt is only taking "every now and then" (02/24/15)    . ranitidine (ZANTAC) 150 MG tablet Take 1 tab at bedtime, nightly. 30 tablet 11  . triamterene-hydrochlorothiazide (MAXZIDE-25) 37.5-25 MG tablet Take 1 tablet by mouth daily. 30 tablet 9  . metoCLOPramide (REGLAN) 10 MG tablet Take 1  tablet (10 mg total) by mouth daily before breakfast. 90 tablet 2   No facility-administered medications prior to visit.   Past Medical History  Diagnosis Date  . Hypertension   . Chest pain     normal LHC 2008;  ETT-Echo 8/10: normal  . Obesity   . GERD (gastroesophageal reflux disease)   . Symptomatic bradycardia     s/p pacer in 2006 in [place]  . Other specified cardiac dysrhythmias(427.89)   . OSA (obstructive sleep apnea)   . SVT (supraventricular tachycardia) (HCC)   . Diastolic heart failure (HCC)   . Sleep apnea   . Barrett's esophagus without dysplasia - subcentimeter changes 06/19/2015  . 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 Gastroparesis 08/19/2015   Past Surgical History  Procedure Laterality Date  . Insert / replace / remove pacemaker      status post pacemaker implant August 2006  . Appendectomy  1994  . Permanent pacemaker generator change N/A 04/16/2012    Procedure: PERMANENT PACEMAKER GENERATOR CHANGE;  Surgeon: Marinus Maw, MD;  Location: [facility] CATH LAB;  Service: Cardiovascular;  Laterality: N/A;  . Colonoscopy    . Upper gastrointestinal endoscopy     Social History   Social History  . Marital Status: Single    Spouse Name: N/A  . Number of Children: N/A  . Years of Education: N/A   Occupational History  . truck driver    Social History Main Topics  .  Smoking status: Never Smoker   . Smokeless tobacco: Never Used  . Alcohol Use: 0.0 oz/week    0 Standard drinks or equivalent per week     Comment: occasional  . Drug Use: No  . Sexual Activity: Yes   Other Topics Concern  . None   Social History Narrative   Tobacco history none. He does admit to using recreational drugs in the past. He has not smoked marijuana in 7 years. 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 Both of his parents are in there 50 s and are  in good health.   Family History  Problem Relation Age of Onset  . Hypertension Other   .  Diabetes Other   . Cancer Other   . CAD Other   . Hypertension Maternal Grandmother   . Hypertension Maternal Grandfather   . Hypertension Paternal Grandmother   . Heart disease Paternal Grandfather   . Hypertension Paternal Grandfather   . Pancreatic cancer Paternal Uncle   . Prostate cancer Paternal Grandfather         Review of Systems As above.    Objective:   Physical Exam BP 132/80 mmHg  Pulse 76  Ht 5' 9.29" (1.76 m)  Wt 308 lb (139.708 kg)  BMI 45.10 kg/m2 Heart shows S1-S2 no rubs murmurs or gallops Lungs are clear bilaterally posteriorly Abdomen is soft no splash there is mild epigastric tenderness there is a small reducible umbilical hernia and an early diastases recti also      Assessment & Plan:   1. Gastroparesis   2. Gastroesophageal reflux disease with esophagitis and very short segment Barrett's   3. Autonomic dysfunction    This seems to be a complicated situation. He has gastroparesis. His cardiologist Dr. Ladona Ridgel thinks he may have some sort of autonomic dysfunction which is why he has a pacemaker. I'm going to have him take the metoclopramide before every meal. He doesn't seem to have too much of a problem at night. He will continue his PPI and H2 blocker. I have reviewed the potential side effects of metoclopramide including tardive dyskinesia long-term.  He may just really have an irregular Z line and not true Barrett's. At that time I performed the procedure I had not yet read the new guidelines regarding columnar tongues greater than an centimeter to meet criteria.  I've given him a gastroparesis diet and an information sheet about gastroparesis. I'm going to refer him to [facility] gastroenterology for further evaluation and potential treatment. Hopefully the metoclopramide will help, but he may need further investigation into this autonomic dysfunction situation if that is indeed the case and I would like a tertiary opinion.

## 2015-08-23 ENCOUNTER — Emergency Department
Admit: 2015-08-23 | Payer: BLUE CROSS/BLUE SHIELD | Primary: Student in an Organized Health Care Education/Training Program

## 2015-08-23 ENCOUNTER — Inpatient Hospital Stay
Admit: 2015-08-23 | Discharge: 2015-08-23 | Disposition: A | Discharge status: Home / Self Care | Payer: BLUE CROSS/BLUE SHIELD | Attending: Emergency Medicine

## 2015-08-23 ENCOUNTER — Encounter: Payer: Self-pay | Admitting: Internal Medicine

## 2015-08-23 ENCOUNTER — Telehealth: Payer: Self-pay

## 2015-08-23 DIAGNOSIS — R002 Palpitations: Secondary | ICD-10-CM

## 2015-08-23 LAB — EKG, 12 LEAD, INITIAL
Atrial Rate: 70 {beats}/min
Calculated P Axis: 32 degrees
Calculated R Axis: 10 degrees
Calculated T Axis: 0 degrees
Diagnosis: NORMAL
P-R Interval: 188 ms
Q-T Interval: 396 ms
QRS Duration: 102 ms
QTC Calculation (Bezet): 427 ms
Ventricular Rate: 70 {beats}/min

## 2015-08-23 LAB — EKG, 12 LEAD, SUBSEQUENT
Atrial Rate: 63 {beats}/min
Calculated P Axis: 38 degrees
Calculated R Axis: 35 degrees
Calculated T Axis: 20 degrees
Diagnosis: NORMAL
P-R Interval: 206 ms
Q-T Interval: 410 ms
QRS Duration: 100 ms
QTC Calculation (Bezet): 419 ms
Ventricular Rate: 63 {beats}/min

## 2015-08-23 LAB — METABOLIC PANEL, COMPREHENSIVE
A-G Ratio: 1.1 (ref 1.1–2.2)
ALT (SGPT): 38 U/L (ref 12–78)
AST (SGOT): 24 U/L (ref 15–37)
Albumin: 3.8 g/dL (ref 3.5–5.0)
Alk. phosphatase: 74 U/L (ref 45–117)
Anion gap: 10 mmol/L (ref 5–15)
BUN/Creatinine ratio: 12 (ref 12–20)
BUN: 15 MG/DL (ref 6–20)
Bilirubin, total: 0.4 MG/DL (ref 0.2–1.0)
CO2: 24 mmol/L (ref 21–32)
Calcium: 8.3 MG/DL — ABNORMAL LOW (ref 8.5–10.1)
Chloride: 106 mmol/L (ref 97–108)
Creatinine: 1.27 MG/DL (ref 0.70–1.30)
GFR est AA: >60 mL/min/{1.73_m2} (ref >60)
GFR est non-AA: >60 mL/min/{1.73_m2} (ref >60)
Globulin: 3.4 g/dL (ref 2.0–4.0)
Glucose: 110 mg/dL — ABNORMAL HIGH (ref 65–100)
Potassium: 3.5 mmol/L (ref 3.5–5.1)
Protein, total: 7.2 g/dL (ref 6.4–8.2)
Sodium: 140 mmol/L (ref 136–145)

## 2015-08-23 LAB — CBC WITH AUTOMATED DIFF
ABS. BASOPHILS: 0 10*3/uL (ref 0.0–0.1)
ABS. EOSINOPHILS: 0.1 10*3/uL (ref 0.0–0.4)
ABS. LYMPHOCYTES: 2.4 10*3/uL (ref 0.8–3.5)
ABS. MONOCYTES: 0.4 10*3/uL (ref 0.0–1.0)
ABS. NEUTROPHILS: 2.4 10*3/uL (ref 1.8–8.0)
BASOPHILS: 0 % (ref 0–1)
EOSINOPHILS: 2 % (ref 0–7)
HCT: 41.6 % (ref 36.6–50.3)
HGB: 13.8 g/dL (ref 12.1–17.0)
LYMPHOCYTES: 46 % (ref 12–49)
MCH: 27.7 PG (ref 26.0–34.0)
MCHC: 33.2 g/dL (ref 30.0–36.5)
MCV: 83.4 FL (ref 80.0–99.0)
MONOCYTES: 8 % (ref 5–13)
NEUTROPHILS: 44 % (ref 32–75)
PLATELET: 221 10*3/uL (ref 150–400)
RBC: 4.99 M/uL (ref 4.10–5.70)
RDW: 13.8 % (ref 11.5–14.5)
WBC: 5.3 10*3/uL (ref 4.1–11.1)

## 2015-08-23 LAB — T4, FREE: T4, Free: 0.9 NG/DL (ref 0.8–1.5)

## 2015-08-23 LAB — URINALYSIS W/ REFLEX CULTURE
Bacteria: NEGATIVE /hpf
Bilirubin: NEGATIVE
Blood: NEGATIVE
Glucose: NEGATIVE mg/dL
Ketone: NEGATIVE mg/dL
Leukocyte Esterase: NEGATIVE
Nitrites: NEGATIVE
Specific gravity: >1.037 — ABNORMAL HIGH (ref 1.003–1.030)
Urobilinogen: 0.2 EU/dL (ref 0.2–1.0)
pH (UA): 5.5 (ref 5.0–8.0)

## 2015-08-23 LAB — NT-PRO BNP: NT pro-BNP: <10 PG/ML (ref 0–125)

## 2015-08-23 LAB — TROPONIN I
Troponin-I, Qt.: <0.04 ng/mL (ref <0.05)
Troponin-I, Qt.: <0.04 ng/mL (ref <0.05)

## 2015-08-23 LAB — PROTHROMBIN TIME + INR
INR: 1 (ref 0.9–1.1)
Prothrombin time: 10 s (ref 9.0–11.1)

## 2015-08-23 LAB — CK W/ CKMB & INDEX
CK - MB: 4.7 NG/ML — ABNORMAL HIGH (ref <3.6)
CK - MB: 4.2 NG/ML — ABNORMAL HIGH (ref <3.6)
CK-MB Index: 0.6 (ref 0–2.5)
CK-MB Index: 0.6 (ref 0–2.5)
CK: 850 U/L — ABNORMAL HIGH (ref 39–308)
CK: 728 U/L — ABNORMAL HIGH (ref 39–308)

## 2015-08-23 LAB — TSH 3RD GENERATION: TSH: 1.5 u[IU]/mL (ref 0.36–3.74)

## 2015-08-23 LAB — D DIMER: D-dimer: 0.21 mg/L FEU (ref 0.00–0.65)

## 2015-08-23 LAB — PTT: aPTT: 33.2 s — ABNORMAL HIGH (ref 22.1–32.5)

## 2015-08-23 LAB — D-DIMER, QUANTITATIVE: D-Dimer, Quant: 0.21 mg/L FEU (ref 0.00–0.65)

## 2015-08-23 MED ORDER — SODIUM CHLORIDE 0.9% BOLUS IV
0.9 % | INTRAVENOUS | Status: AC
Start: 2015-08-23 — End: 2015-08-23
  Administered 2015-08-23: 06:00:00 via INTRAVENOUS

## 2015-08-23 MED FILL — SODIUM CHLORIDE 0.9 % IV: INTRAVENOUS | Qty: 1000

## 2015-08-23 NOTE — ED Notes (Signed)
Dr obier at bedside talking with pt

## 2015-08-23 NOTE — ED Notes (Signed)
Discharge instructions reviewed with pt and copy given by dr knorr

## 2015-08-23 NOTE — ED Provider Notes (Addendum)
HPI Comments: Mark Scott is a 45 y.o. male, pmhx HTN / GERD / bradycardia, who presents via EMS to the ED c/o new onset heart palpitations that woke him from his sleep PTA this morning. He reports additional LUE and LLE numbness and tingling. On evaluation in the ED, pt states his palpitations have resolved without intervention. 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 Pt further notes having a cardiac ablation in [place] ~1 year ago. He states he takes a daily ASA, but denies any other anticoagulants. He reports adherence with his daily HTN medication. He denies any recent EtOH. Pt specifically denies any recent BLE swelling or CP.     PCP: Ginette Otto, [place]  Cardiologist: [name], MD    Allergies: NKDA  PMHx: Significant for HTN, GERD, bradycardia  PSHx: Significant for pacemaker placement, cardiac ablation  Social Hx: -tobacco, +EtOH (occasional wine), -Illicit Drugs  Occupation: Truck Driver    There are no other complaints, changes, or physical findings at this time.     The history is provided by the patient and the EMS personnel.        Past Medical History:   Diagnosis Date   ??? Gastrointestinal disorder      GERD   ??? Hypertension        Past Surgical History:   Procedure Laterality Date   ??? Hx pacemaker     ??? Hx appendectomy           History reviewed. No pertinent family history.    Social History     Social History   ??? Marital status: SINGLE     Spouse name: N/A   ??? Number of children: N/A   ??? Years of education: N/A     Occupational History   ??? Not on file.     Social History Main Topics   ??? Smoking status: Never Smoker   ??? Smokeless tobacco: Not on file   ??? Alcohol use No   ??? Drug use: No   ??? Sexual activity: Not on file     Other Topics Concern   ??? Not on file     Social History Narrative   ??? No narrative on file         ALLERGIES: Review of patient's allergies indicates no known allergies.    Review of Systems    Constitutional: Negative for chills and fever.   HENT: Negative.    Eyes: Negative.    Respiratory: Negative for cough, chest tightness and shortness of breath.    Cardiovascular: Positive for palpitations. Negative for chest pain and leg swelling.   Gastrointestinal: Negative for abdominal pain, diarrhea, nausea and vomiting.   Endocrine: Negative.    Genitourinary: Negative for difficulty urinating and dysuria.   Musculoskeletal: Negative for myalgias.   Skin: Negative.    Neurological: Positive for numbness (LUE / LLE).   Psychiatric/Behavioral: Negative.    All other systems reviewed and are negative.      Vitals:    08/23/15 0055   Pulse: 68   Resp: 18   Temp: 98 ??F (36.7 ??C)   SpO2: 97%   Weight: 136.1 kg (300 lb)   Height:  (1.803 m)            Physical Exam   Constitutional: He is oriented to person, place, and time. He appears well-developed and well-nourished. No distress.   HENT:   Head: Normocephalic and  atraumatic.   Nose: Nose normal.   Mouth/Throat: No oropharyngeal exudate.   Eyes: Conjunctivae and EOM are normal. Pupils are equal, round, and reactive to light.   Neck: Normal range of motion. Neck supple. No JVD present.   Cardiovascular: Normal rate, regular rhythm, normal heart sounds and intact distal pulses.  Exam reveals no friction rub.    No murmur heard.  Pulmonary/Chest: Effort normal and breath sounds normal. No stridor. No respiratory distress. He has no wheezes. He has no rales.   Abdominal: Soft. Bowel sounds are normal. He exhibits no distension. There is no tenderness. There is no rebound.   Musculoskeletal: Normal range of motion. He exhibits no tenderness.   Neurological: He is alert and oriented to person, place, and time. No cranial nerve deficit.   Skin: Skin is warm and dry. No rash noted. He is not diaphoretic.   Psychiatric: He has a normal mood and affect. His behavior is normal. Judgment and thought content normal.   Nursing note and vitals reviewed.       MDM   Number of Diagnoses or Management Options  Palpitations:   Paresthesias in left hand:   Diagnosis management comments: DDX:  Arrhythmia, palpitations, electrolyte abnormaltily, acs, pe    Plan:  Labs, d dimer, ekg, cxr, ivf    Impression:  palpitations         Amount and/or Complexity of Data Reviewed  Clinical lab tests: reviewed and ordered  Tests in the radiology section of CPT??: ordered and reviewed  Tests in the medicine section of CPT??: reviewed and ordered  Review and summarize past medical records: yes  Independent visualization of images, tracings, or specimens: yes    Patient Progress  Patient progress: stable      Procedures    I reviewed our electronic medical record system for any past medical records that were available that may contribute to the patients current condition, the nursing notes and and vital signs from today's visit    Nursing notes will be reviewed as they become available in realtime while the pt has been in the ED.  Mellody Life, MD      Pt's rate is 68bpm with a NSR rhythm as noted on Cardiac monitor.   SpO2 is 97%, on (RA)    EKG interpretation 0100: NSR, nl Axis, rate 70; PR 188, QRS 102, QTc 427; S1Q3T3 pattern, possible acute ischemia; Mellody Life, MD    I personally reviewed pt's imaging.  Official read by radiology listed below.  Mellody Life, MD    EKG interpretation [id number]: NSR, nl Axis, rate 63; PR 206, QRS 100, QTc 419; possible acute ischemia; Mellody Life, MD      5:14 AM  Progress note:  Pt noted to be feeling better, no episodes of palpitations while in ED or CP, arm tingling improved, ready for discharge. Discussed lab and imaging findings with pt and/or family.  Will follow up as instructed. All questions have been answered, pt voiced understanding and agreement with plan. If narcotics were prescribed, pt was advised not to drive or operate heavy machinery. If abx were prescribed, pt advised that diarrhea and rash are possible side effects of the medications.    Specific return precautions provided as well as instructions to return to the ED should sx worsen at any time.  Mellody Life, MD      LABORATORY TESTS:  Recent Results (from the past 12 hour(s))   CBC WITH AUTOMATED DIFF  Collection Time: 08/23/15  1:12 AM   Result Value Ref Range    WBC 5.3 4.1 - 11.1 K/uL    RBC 4.99 4.10 - 5.70 M/uL    HGB 13.8 12.1 - 17.0 g/dL    HCT 40.1 02.7 - 25.3 %    MCV 83.4 80.0 - 99.0 FL    MCH 27.7 26.0 - 34.0 PG    MCHC 33.2 30.0 - 36.5 g/dL    RDW 66.4 40.3 - 47.4 %    PLATELET 221 150 - 400 K/uL    NEUTROPHILS 44 32 - 75 %    LYMPHOCYTES 46 12 - 49 %    MONOCYTES 8 5 - 13 %    EOSINOPHILS 2 0 - 7 %    BASOPHILS 0 0 - 1 %    ABS. NEUTROPHILS 2.4 1.8 - 8.0 K/UL    ABS. LYMPHOCYTES 2.4 0.8 - 3.5 K/UL    ABS. MONOCYTES 0.4 0.0 - 1.0 K/UL    ABS. EOSINOPHILS 0.1 0.0 - 0.4 K/UL    ABS. BASOPHILS 0.0 0.0 - 0.1 K/UL   PROTHROMBIN TIME + INR    Collection Time: 08/23/15  1:12 AM   Result Value Ref Range    INR 1.0 0.9 - 1.1      Prothrombin time 10.0 9.0 - 11.1 sec   PTT    Collection Time: 08/23/15  1:12 AM   Result Value Ref Range    aPTT 33.2 (H) 22.1 - 32.5 sec    aPTT, therapeutic range     58.0 - 77.0 SECS   D DIMER    Collection Time: 08/23/15  1:12 AM   Result Value Ref Range    D-dimer 0.21 0.00 - 0.65 mg/L FEU       IMAGING RESULTS:  XR CHEST PA LAT   Final Result         Study Result    ?? INDICATION: palpitations   ??  EXAM: Chest 2 views. No comparisons.   ??  Findings: Cardiac silhouette is enlarged. Pulmonary vasculature is not engorged.  There is patchy right perihilar airspace disease/atelectasis. No pneumothorax or  effusion. Dual-lead left chest pacer..  ??  IMPRESSION  Impression:  1. Enlarged cardiac silhouette, question right perihilar airspace disease  ??         MEDICATIONS GIVEN:  Medications   sodium chloride 0.9 % bolus infusion 1,000 mL (1,000 mL IntraVENous New Bag 08/23/15 0118)       IMPRESSION:  1. Palpitations    2. Paresthesias in left hand        PLAN:  1.    Current Discharge Medication List      CONTINUE these medications which have NOT CHANGED    Details   OTHER Blood pressure medication similar to HCTZ 37-25mg       aspirin 81 mg chewable tablet Take 81 mg by mouth daily.           2.   Follow-up Information     Follow up With Details Comments Contact Info    Your Cardiologist Schedule an appointment as soon as possible for a visit in 2 days          Return to ED if worse     DISCHARGE NOTE:  5:14 AM  The patient's results have been reviewed with family and/or caregiver. They verbally convey their understanding and agreement of the patient's signs, symptoms, diagnosis, treatment, and prognosis. They additionally agree to follow up as recommended  in the discharge instructions or to return to the Emergency Room should the patient's condition change prior to their follow-up appointment. The family and/or caregiver verbally agrees with the care-plan and all of their questions have been answered. The discharge instructions have also been provided to the them along with educational information regarding the patient's diagnosis and a list of reasons why the patient would want to return to the ER prior to their follow-up appointment should their condition change.  Written by Lorenda Ishihara, ED Scribe, as dictated by Mellody Life, MD.         This note is prepared by Lorenda Ishihara, acting as Scribe for Mellody Life, MD    Mellody Life, MD : The scribe's documentation has been prepared under my direction and personally reviewed by me in its entirety. I confirm that the note above accurately reflects all work, treatment, procedures, and medical decision making performed by me.    This note will not be viewable in MyChart.

## 2015-08-23 NOTE — ED Notes (Signed)
Assumed care, pt resting in bed with eyes closed, call bell within reach, awaiting repeat labs

## 2015-08-23 NOTE — Telephone Encounter (Signed)
Referral faxed to [facility]

## 2015-08-23 NOTE — Telephone Encounter (Signed)
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

## 2015-08-24 LAB — DRUG SCREEN, URINE
AMPHETAMINES: NEGATIVE
BARBITURATES: NEGATIVE
BENZODIAZEPINES: NEGATIVE
COCAINE: NEGATIVE
METHADONE: NEGATIVE
OPIATES: NEGATIVE
PCP(PHENCYCLIDINE): NEGATIVE
THC (TH-CANNABINOL): NEGATIVE

## 2015-09-04 ENCOUNTER — Emergency Department (HOSPITAL_COMMUNITY): Admission: EM | Admit: 2015-09-04 | Discharge: 2015-09-04 | Discharge status: Against Medical Advice | Payer: Self-pay

## 2015-09-04 NOTE — ED Notes (Signed)
Per [facility], pt left to go to [facility]

## 2015-09-05 DIAGNOSIS — Z7982 Long term (current) use of aspirin: Secondary | ICD-10-CM | POA: Insufficient documentation

## 2015-09-05 DIAGNOSIS — R42 Dizziness and giddiness: Secondary | ICD-10-CM | POA: Insufficient documentation

## 2015-09-05 DIAGNOSIS — R1012 Left upper quadrant pain: Secondary | ICD-10-CM | POA: Insufficient documentation

## 2015-09-05 DIAGNOSIS — Z79899 Other long term (current) drug therapy: Secondary | ICD-10-CM | POA: Insufficient documentation

## 2015-09-05 DIAGNOSIS — I5032 Chronic diastolic (congestive) heart failure: Secondary | ICD-10-CM | POA: Insufficient documentation

## 2015-09-05 DIAGNOSIS — I10 Essential (primary) hypertension: Secondary | ICD-10-CM | POA: Insufficient documentation

## 2015-09-05 DIAGNOSIS — R110 Nausea: Secondary | ICD-10-CM | POA: Insufficient documentation

## 2015-09-05 DIAGNOSIS — Z950 Presence of cardiac pacemaker: Secondary | ICD-10-CM | POA: Insufficient documentation

## 2015-09-05 DIAGNOSIS — R079 Chest pain, unspecified: Secondary | ICD-10-CM | POA: Insufficient documentation

## 2015-09-05 NOTE — Telephone Encounter (Signed)
Patient is scheduled for 11/24/15 at 8:40 with Dr. Milagros Evener appt was arranged directly with the patient and [facility].  He is aware of the appt

## 2015-09-06 ENCOUNTER — Emergency Department
Admission: EM | Admit: 2015-09-06 | Discharge: 2015-09-06 | Disposition: A | Discharge status: Home / Self Care | Payer: Self-pay | Attending: Emergency Medicine | Admitting: Emergency Medicine

## 2015-09-06 ENCOUNTER — Emergency Department: Payer: Self-pay

## 2015-09-06 DIAGNOSIS — R1012 Left upper quadrant pain: Secondary | ICD-10-CM

## 2015-09-06 DIAGNOSIS — R079 Chest pain, unspecified: Secondary | ICD-10-CM

## 2015-09-06 LAB — URINALYSIS COMPLETE WITH MICROSCOPIC (ARMC ONLY)
BILIRUBIN URINE: NEGATIVE
Bacteria, UA: NONE SEEN
Glucose, UA: NEGATIVE mg/dL
HGB URINE DIPSTICK: NEGATIVE
KETONES UR: NEGATIVE mg/dL
Leukocytes, UA: NEGATIVE
NITRITE: NEGATIVE
PH: 5 (ref 5.0–8.0)
Protein, ur: NEGATIVE mg/dL
SPECIFIC GRAVITY, URINE: 1.033 — AB (ref 1.005–1.030)

## 2015-09-06 LAB — BASIC METABOLIC PANEL
Anion gap: 5 (ref 5–15)
BUN: 18 mg/dL (ref 6–20)
CHLORIDE: 105 mmol/L (ref 101–111)
CO2: 30 mmol/L (ref 22–32)
CREATININE: 1.53 mg/dL — AB (ref 0.61–1.24)
Calcium: 9.1 mg/dL (ref 8.9–10.3)
GFR calc Af Amer: >60 mL/min (ref >60)
GFR calc non Af Amer: 54 mL/min — ABNORMAL LOW (ref >60)
GLUCOSE: 96 mg/dL (ref 65–99)
Potassium: 4.2 mmol/L (ref 3.5–5.1)
SODIUM: 140 mmol/L (ref 135–145)

## 2015-09-06 LAB — CBC
HCT: 41.6 % (ref 40.0–52.0)
Hemoglobin: 13.7 g/dL (ref 13.0–18.0)
MCH: 27.2 pg (ref 26.0–34.0)
MCHC: 32.9 g/dL (ref 32.0–36.0)
MCV: 82.7 fL (ref 80.0–100.0)
PLATELETS: 233 10*3/uL (ref 150–440)
RBC: 5.03 MIL/uL (ref 4.40–5.90)
RDW: 14.9 % — AB (ref 11.5–14.5)
WBC: 6.7 10*3/uL (ref 3.8–10.6)

## 2015-09-06 LAB — HEPATIC FUNCTION PANEL
ALBUMIN: 4 g/dL (ref 3.5–5.0)
ALK PHOS: 61 U/L (ref 38–126)
ALT: 29 U/L (ref 17–63)
AST: 29 U/L (ref 15–41)
BILIRUBIN TOTAL: 0.6 mg/dL (ref 0.3–1.2)
Total Protein: 7.3 g/dL (ref 6.5–8.1)

## 2015-09-06 LAB — TROPONIN I
TROPONIN I: 0.03 ng/mL (ref <0.031)
Troponin I: 0.04 ng/mL — ABNORMAL HIGH (ref <0.031)

## 2015-09-06 LAB — LIPASE, BLOOD: Lipase: 28 U/L (ref 11–51)

## 2015-09-06 MED ORDER — SUCRALFATE 1 G PO TABS: 1.0000 g | ORAL_TABLET | Freq: Two times a day (BID) | ORAL | Status: DC

## 2015-09-06 MED ORDER — KETOROLAC TROMETHAMINE 30 MG/ML IJ SOLN
30.0000 mg | Freq: Once | INTRAMUSCULAR | Status: AC
  Administered 2015-09-06: 30 mg via INTRAVENOUS

## 2015-09-06 MED ORDER — KETOROLAC TROMETHAMINE 60 MG/2ML IM SOLN: 60.0000 mg | Freq: Once | INTRAMUSCULAR | Status: DC

## 2015-09-06 MED ORDER — GI COCKTAIL ~~LOC~~
30.0000 mL | Freq: Once | ORAL | Status: AC
  Administered 2015-09-06: 30 mL via ORAL
  Filled 2015-09-06: qty 30

## 2015-09-06 NOTE — ED Notes (Addendum)
Pt presents to ED with c/o left sided chest pain x 3 days. Pt reports having a pacemaker implanted in 2006, with batteries replaced 2012-2013. Pt reports pain radiates into left shoulder and left arm, (+) nausea and lightheadedness, but no diaphoresis or vomiting. Pt denies SHOB, but rates pain currently at 8/10. Pt is A&O, in NAD, with respirations even, regular and unlabored.

## 2015-09-06 NOTE — ED Notes (Signed)
Lab called with troponin of 0.04  Charge nurse raquel rn aware.

## 2015-09-06 NOTE — Discharge Instructions (Signed)
Abdominal Pain, Adult Many things can cause abdominal pain. Usually, abdominal pain is not caused by a disease and will improve without treatment. It can often be observed and treated at home. Your health care provider will do a physical exam and possibly order blood tests and X-rays to help determine the seriousness of your pain. However, in many cases, more time must pass before a clear cause of the pain can be found. Before that point, your health care provider may not know if you need more testing or further treatment. HOME CARE INSTRUCTIONS Monitor your abdominal pain for any changes. The following actions may help to alleviate any discomfort you are experiencing:  Only take over-the-counter or prescription medicines as directed by your health care provider.  Do not take laxatives unless directed to do so by your health care provider.  Try a clear liquid diet (broth, tea, or water) as directed by your health care provider. Slowly move to a bland diet as tolerated. SEEK MEDICAL CARE IF:  You have unexplained abdominal pain.  You have abdominal pain associated with nausea or diarrhea.  You have pain when you urinate or have a bowel movement.  You experience abdominal pain that wakes you in the night.  You have abdominal pain that is worsened or improved by eating food.  You have abdominal pain that is worsened with eating fatty foods.  You have a fever. SEEK IMMEDIATE MEDICAL CARE IF:  Your pain does not go away within 2 hours.  You keep throwing up (vomiting).  Your pain is felt only in portions of the abdomen, such as the right side or the left lower portion of the abdomen.  You pass bloody or black tarry stools. MAKE SURE YOU:  Understand these instructions.  Will watch your condition.  Will get help right away if you are not doing well or get worse.   This information is not intended to replace advice given to you by your health care provider. Make sure you discuss  any questions you have with your health care provider.   Document Released: 04/18/2005 Document Revised: 03/30/2015 Document Reviewed: 03/18/2013 Elsevier Interactive Patient Education 2016 Else[employer] Inc[employer]  Nonspecific Chest Pain  Chest pain can be caused by many different conditions. There is always a chance that your pain could be related to something serious, such as a heart attack or a blood clot in your lungs. Chest pain can also be caused by conditions that are not life-threatening. If you have chest pain, it is very important to follow up with your health care provider. CAUSES  Chest pain can be caused by:  Heartburn.  Pneumonia or bronchitis.  Anxiety or stress.  Inflammation around your heart (pericarditis) or lung (pleuritis or pleurisy).  A blood clot in your lung.  A collapsed lung (pneumothorax). It can develop suddenly on its own (spontaneous pneumothorax) or from trauma to the chest.  Shingles infection (varicella-zoster virus).  Heart attack.  Damage to the bones, muscles, and cartilage that make up your chest wall. This can include:  Bruised bones due to injury.  Strained muscles or cartilage due to frequent or repeated coughing or overwork.  Fracture to one or more ribs.  Sore cartilage due to inflammation (costochondritis). RISK FACTORS  Risk factors for chest pain may include:  Activities that increase your risk for trauma or injury to your chest.  Respiratory infections or conditions that cause frequent coughing.  Medical conditions or overeating that can cause heartburn.  Heart disease or family  history of heart disease.  Conditions or health behaviors that increase your risk of developing a blood clot.  Having had chicken pox (varicella zoster). SIGNS AND SYMPTOMS Chest pain can feel like:  Burning or tingling on the surface of your chest or deep in your chest.  Crushing, pressure, aching, or squeezing pain.  Dull or sharp pain that is  worse when you move, cough, or take a deep breath.  Pain that is also felt in your back, neck, shoulder, or arm, or pain that spreads to any of these areas. Your chest pain may come and go, or it may stay constant. DIAGNOSIS Lab tests or other studies may be needed to find the cause of your pain. Your health care provider may have you take a test called an ambulatory ECG (electrocardiogram). An ECG records your heartbeat patterns at the time the test is performed. You may also have other tests, such as:  Transthoracic echocardiogram (TTE). During echocardiography, sound waves are used to create a picture of all of the heart structures and to look at how blood flows through your heart.  Transesophageal echocardiogram (TEE).This is a more advanced imaging test that obtains images from inside your body. It allows your health care provider to see your heart in finer detail.  Cardiac monitoring. This allows your health care provider to monitor your heart rate and rhythm in real time.  Holter monitor. This is a portable device that records your heartbeat and can help to diagnose abnormal heartbeats. It allows your health care provider to track your heart activity for several days, if needed.  Stress tests. These can be done through exercise or by taking medicine that makes your heart beat more quickly.  Blood tests.  Imaging tests. TREATMENT  Your treatment depends on what is causing your chest pain. Treatment may include:  Medicines. These may include:  Acid blockers for heartburn.  Anti-inflammatory medicine.  Pain medicine for inflammatory conditions.  Antibiotic medicine, if an infection is present.  Medicines to dissolve blood clots.  Medicines to treat coronary artery disease.  Supportive care for conditions that do not require medicines. This may include:  Resting.  Applying heat or cold packs to injured areas.  Limiting activities until pain decreases. HOME CARE  INSTRUCTIONS  If you were prescribed an antibiotic medicine, finish it all even if you start to feel better.  Avoid any activities that bring on chest pain.  Do not use any tobacco products, including cigarettes, chewing tobacco, or electronic cigarettes. If you need help quitting, ask your health care provider.  Do not drink alcohol.  Take medicines only as directed by your health care provider.  Keep all follow-up visits as directed by your health care provider. This is important. This includes any further testing if your chest pain does not go away.  If heartburn is the cause for your chest pain, you may be told to keep your head raised (elevated) while sleeping. This reduces the chance that acid will go from your stomach into your esophagus.  Make lifestyle changes as directed by your health care provider. These may include:  Getting regular exercise. Ask your health care provider to suggest some activities that are safe for you.  Eating a heart-healthy diet. A registered dietitian can help you to learn healthy eating options.  Maintaining a healthy weight.  Managing diabetes, if necessary.  Reducing stress. SEEK MEDICAL CARE IF:  Your chest pain does not go away after treatment.  You have a rash with  blisters on your chest.  You have a fever. SEEK IMMEDIATE MEDICAL CARE IF:   Your chest pain is worse.  You have an increasing cough, or you cough up blood.  You have severe abdominal pain.  You have severe weakness.  You faint.  You have chills.  You have sudden, unexplained chest discomfort.  You have sudden, unexplained discomfort in your arms, back, neck, or jaw.  You have shortness of breath at any time.  You suddenly start to sweat, or your skin gets clammy.  You feel nauseous or you vomit.  You suddenly feel light-headed or dizzy.  Your heart begins to beat quickly, or it feels like it is skipping beats. These symptoms may represent a serious  problem that is an emergency. Do not wait to see if the symptoms will go away. Get medical help right away. Call your local emergency services (911 in the [place] Do not drive yourself to the hospital.   This information is not intended to replace advice given to you by your health care provider. Make sure you discuss any questions you have with your health care provider.   Document Released: 04/18/2005 Document Revised: 07/30/2014 Document Reviewed: 02/12/2014 Elsevier Interactive Patient Education 2016 Elsevier Inc.  Flank Pain Flank pain is pain in your side. The flank is the area of your side between your upper belly (abdomen) and your back. Pain in this area can be caused by many different things. HOME CARE Home care and treatment will depend on the cause of your pain.  Rest as told by your doctor.  Drink enough fluids to keep your pee (urine) clear or pale yellow.  Only take medicine as told by your doctor.  Tell your doctor about any changes in your pain.  Follow up with your doctor. GET HELP RIGHT AWAY IF:   Your pain does not get better with medicine.   You have new symptoms or your symptoms get worse.  Your pain gets worse.   You have belly (abdominal) pain.   You are short of breath.   You always feel sick to your stomach (nauseous).   You keep throwing up (vomiting).   You have puffiness (swelling) in your belly.   You feel light-headed or you pass out (faint).   You have blood in your pee.  You have a fever or lasting symptoms for more than 2-3 days.  You have a fever and your symptoms suddenly get worse. MAKE SURE YOU:   Understand these instructions.  Will watch your condition.  Will get help right away if you are not doing well or get worse.   This information is not intended to replace advice given to you by your health care provider. Make sure you discuss any questions you have with your health care provider.   Document Released:  04/17/2008 Document Revised: 07/30/2014 Document Reviewed: 02/21/2012 Elsevier Interactive Patient Education [employer]

## 2015-09-06 NOTE — ED Provider Notes (Signed)
[facility] Emergency Department Provider Note  ____________________________________________  Time seen: Approximately 233 AM  I have reviewed the triage vital signs and the nursing notes.   HISTORY  Chief Complaint Chest Pain    HPI Jim Hill is a 45 y.o. male who comes into the hospital today with left-sided pain. He reports that he's been having this pain all weekend and it started radiating to his left arm. He reports that he's taken probably 18 Tylenol over the weekend and the pain will go away. The patient denies any lifting injury but he wasn't sure if it was his heart her stomach so he decided to come in and get checked out. The patient has had upper abdominal pain before over his stomach but he reports that this is the first time is been on the left side. He reports that it feels like his appendicitis did multiple years ago but on the left side. The pain is sharp and stabbing a 5-6 out of 10 in intensity. The patient has had some nausea and lightheadedness with no shortness of breath sweats vomiting or diarrhea. The patient also denies any sick contacts. The patient has been seen by the GI doctor and has had an endoscopy to evaluate his upper abdominal pain but he reports that everything has come back negative. The patient reports that the pain was very intense that he wanted to get checked out.   Past Medical History  Diagnosis Date  . Hypertension   . Chest pain     normal LHC 2008;  ETT-Echo 8/10: normal  . Obesity   . GERD (gastroesophageal reflux disease)   . Symptomatic bradycardia     s/p pacer in 2006 in [place]  . Other specified cardiac dysrhythmias(427.89)   . OSA (obstructive sleep apnea)   . SVT (supraventricular tachycardia) (HCC)   . Diastolic heart failure (HCC)   . Sleep apnea   . Barrett's esophagus without dysplasia - subcentimeter changes 06/19/2015  . 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 Gastroparesis 08/19/2015     Patient Active Problem List   Diagnosis Date Noted  . Gastroparesis 08/19/2015  . 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 Barrett's esophagus without dysplasia - subcentimeter changes 06/19/2015  . 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 Severe obesity (BMI >= 40) (HCC) 04/10/2015  . SVT (supraventricular tachycardia) (HCC) 12/02/2014  . Dyspnea 11/29/2014  . Weight gain 11/29/2014  . Palpitations 11/12/2014  . OSA (obstructive sleep apnea) 09/01/2014  . Chronic diastolic heart failure (HCC) 07/06/2014  . Chest pain 07/09/2011  . GERD (gastroesophageal reflux disease) 07/09/2011  . Pacemaker-Medtronic 03/30/2011  . Hypertension 03/30/2011    Past Surgical History  Procedure Laterality Date  . Insert / replace / remove pacemaker      status post pacemaker implant August 2006  . Appendectomy  1994  . Permanent pacemaker generator change N/A 04/16/2012    Procedure: PERMANENT PACEMAKER GENERATOR CHANGE;  Surgeon: Marinus Maw, MD;  Location: [facility] CATH LAB;  Service: Cardiovascular;  Laterality: N/A;  . Colonoscopy    . Upper gastrointestinal endoscopy      Current Outpatient Rx  Name  Route  Sig  Dispense  Refill  . aspirin 81 MG chewable tablet   Oral   Chew 81 mg by mouth daily.         Marland Kitchen esomeprazole (NEXIUM) 40 MG capsule   Oral   Take 1 capsule (40 mg total) by mouth daily.   30 capsule   2   .  metoCLOPramide (REGLAN) 10 MG tablet   Oral   Take 1 tablet (10 mg total) by mouth 3 (three) times daily before meals.   90 tablet   2   . potassium chloride SA (K-DUR,KLOR-CON) 20 MEQ tablet   Oral   Take 20 mEq by mouth daily. Pt is only taking "every now and then" (02/24/15)         . ranitidine (ZANTAC) 150 MG tablet      Take 1 tab at bedtime, nightly.   30 tablet   11   . sucralfate (CARAFATE) 1 g tablet   Oral   Take 1 tablet (1 g total) by mouth 2 (two) times daily.   20 tablet   0   . triamterene-hydrochlorothiazide (MAXZIDE-25) 37.5-25 MG tablet    Oral   Take 1 tablet by mouth daily.   30 tablet   9     Allergies Review of patient's allergies indicates no known allergies.  Family History  Problem Relation Age of Onset  . Hypertension Other   . Diabetes Other   . Cancer Other   . CAD Other   . Hypertension Maternal Grandmother   . Hypertension Maternal Grandfather   . Hypertension Paternal Grandmother   . Heart disease Paternal Grandfather   . Hypertension Paternal Grandfather   . Pancreatic cancer Paternal Uncle   . Prostate cancer Paternal Grandfather     Social History Social History  Substance Use Topics  . Smoking status: Never Smoker   . Smokeless tobacco: Never Used  . Alcohol Use: 0.0 oz/week    0 Standard drinks or equivalent per week     Comment: occasional    Review of Systems Constitutional: No fever/chills Eyes: No visual changes. ENT: No sore throat. Cardiovascular: Denies chest pain. Respiratory: Denies shortness of breath. Gastrointestinal: abdominal pain.   nausea, no vomiting.  No diarrhea.  No constipation. Genitourinary: Negative for dysuria. Musculoskeletal: Negative for back pain. Skin: Negative for rash. Neurological: Dizziness  10-point ROS otherwise negative.  ____________________________________________   PHYSICAL EXAM:  VITAL SIGNS: ED Triage Vitals  Enc Vitals Group     BP 09/06/15 0034 138/82 mmHg     Pulse Rate 09/06/15 0034 61     Resp 09/06/15 0034 16     Temp 09/06/15 0034 97.8 F (36.6 C)     Temp Source 09/06/15 0034 Oral     SpO2 09/06/15 0034 99 %     Weight 09/06/15 0034 305 lb (138.347 kg)     Height 09/06/15 0034 5\' 11"  (1.803 m)     Head Cir --      Peak Flow --      Pain Score 09/06/15 0034 8     Pain Loc --      Pain Edu? --      Excl. in GC? --     Constitutional: Alert and oriented. Well appearing and in no acute distress. Eyes: Conjunctivae are normal. PERRL. EOMI. Head: Atraumatic. Nose: No congestion/rhinnorhea. Mouth/Throat: Mucous  membranes are moist.  Oropharynx non-erythematous. Cardiovascular: Normal rate, regular rhythm. Grossly normal heart sounds.  Good peripheral circulation. Respiratory: Normal respiratory effort.  No retractions. Lungs CTAB. Gastrointestinal: Left upper quadrant tenderness to palpation. No distention. Positive bowel sounds CVA tenderness to palpation Musculoskeletal: No lower extremity tenderness nor edema.   Neurologic:  Normal speech and language.  Skin:  Skin is warm, dry and intact.  Psychiatric: Mood and affect are normal.   ____________________________________________   LABS (all labs ordered  are listed, but only abnormal results are displayed)  Labs Reviewed  BASIC METABOLIC PANEL - Abnormal; Notable for the following:    Creatinine, Ser 1.53 (*)    GFR calc non Af Amer 54 (*)    All other components within normal limits  CBC - Abnormal; Notable for the following:    RDW 14.9 (*)    All other components within normal limits  TROPONIN I - Abnormal; Notable for the following:    Troponin I 0.04 (*)    All other components within normal limits  URINALYSIS COMPLETEWITH MICROSCOPIC ([facility] ONLY) - Abnormal; Notable for the following:    Color, Urine YELLOW (*)    APPearance CLEAR (*)    Specific Gravity, Urine 1.033 (*)    Squamous Epithelial / LPF 0-5 (*)    All other components within normal limits  HEPATIC FUNCTION PANEL - Abnormal; Notable for the following:    Bilirubin, Direct <0.1 (*)    All other components within normal limits  LIPASE, BLOOD  TROPONIN I   ____________________________________________  EKG  ED ECG REPORT I, Rebecka Apley, the attending physician, personally viewed and interpreted this ECG.   Date: 09/06/2015  EKG Time: 0041  Rate: 74  Rhythm: normal sinus rhythm  Axis: Normal  Intervals:none  ST&T Change: None  ____________________________________________  RADIOLOGY  Chest x-ray: No active cardiopulmonary  disease ____________________________________________   PROCEDURES  Procedure(s) performed: None  Critical Care performed: No  ____________________________________________   INITIAL IMPRESSION / ASSESSMENT AND PLAN / ED COURSE  Pertinent labs & imaging results that were available during my care of the patient were reviewed by me and considered in my medical decision making (see chart for details).  This is a 45 year old male who comes into the hospital today with some left upper quadrant/chest pain that radiated to his left arm. I will give the patient a GI cocktail and also check a urinalysis to evaluate for possible stone. The patient receive a dose of Toradol and I will reassess him once I received his repeat troponin and the remainder of his blood work.  The patient's urinalysis is unremarkable as is his lipase and repeat troponin. The patient also has normal hepatic function panel. The patient reports that he does feel improved at this time. I am unsure of the cause of his symptoms but I will have him follow back up with his primary care physician for further evaluation. The patient will be discharged to home. ____________________________________________   FINAL CLINICAL IMPRESSION(S) / ED DIAGNOSES  Final diagnoses:  Left upper quadrant pain  Chest pain, unspecified chest pain type      Rebecka Apley, MD 09/06/15 0510

## 2015-09-29 ENCOUNTER — Ambulatory Visit: Payer: Self-pay | Admitting: Internal Medicine

## 2015-10-11 ENCOUNTER — Emergency Department (HOSPITAL_COMMUNITY): Payer: BLUE CROSS/BLUE SHIELD

## 2015-10-11 ENCOUNTER — Emergency Department (HOSPITAL_COMMUNITY)
Admission: EM | Admit: 2015-10-11 | Discharge: 2015-10-12 | Disposition: A | Discharge status: Home / Self Care | Payer: BLUE CROSS/BLUE SHIELD | Attending: Emergency Medicine | Admitting: Emergency Medicine

## 2015-10-11 DIAGNOSIS — R110 Nausea: Secondary | ICD-10-CM | POA: Insufficient documentation

## 2015-10-11 DIAGNOSIS — Z79899 Other long term (current) drug therapy: Secondary | ICD-10-CM | POA: Insufficient documentation

## 2015-10-11 DIAGNOSIS — R200 Anesthesia of skin: Secondary | ICD-10-CM | POA: Insufficient documentation

## 2015-10-11 DIAGNOSIS — R0789 Other chest pain: Secondary | ICD-10-CM | POA: Diagnosis not present

## 2015-10-11 DIAGNOSIS — R61 Generalized hyperhidrosis: Secondary | ICD-10-CM | POA: Diagnosis not present

## 2015-10-11 DIAGNOSIS — I10 Essential (primary) hypertension: Secondary | ICD-10-CM | POA: Diagnosis not present

## 2015-10-11 DIAGNOSIS — Z950 Presence of cardiac pacemaker: Secondary | ICD-10-CM | POA: Diagnosis not present

## 2015-10-11 DIAGNOSIS — K219 Gastro-esophageal reflux disease without esophagitis: Secondary | ICD-10-CM | POA: Diagnosis not present

## 2015-10-11 DIAGNOSIS — Z8669 Personal history of other diseases of the nervous system and sense organs: Secondary | ICD-10-CM | POA: Diagnosis not present

## 2015-10-11 DIAGNOSIS — Z7982 Long term (current) use of aspirin: Secondary | ICD-10-CM | POA: Diagnosis not present

## 2015-10-11 DIAGNOSIS — I5030 Unspecified diastolic (congestive) heart failure: Secondary | ICD-10-CM | POA: Insufficient documentation

## 2015-10-11 DIAGNOSIS — R079 Chest pain, unspecified: Secondary | ICD-10-CM | POA: Diagnosis present

## 2015-10-11 DIAGNOSIS — E669 Obesity, unspecified: Secondary | ICD-10-CM | POA: Diagnosis not present

## 2015-10-11 LAB — CBC
HCT: 40.2 % (ref 39.0–52.0)
HEMOGLOBIN: 13.8 g/dL (ref 13.0–17.0)
MCH: 27.8 pg (ref 26.0–34.0)
MCHC: 34.3 g/dL (ref 30.0–36.0)
MCV: 81 fL (ref 78.0–100.0)
Platelets: 225 10*3/uL (ref 150–400)
RBC: 4.96 MIL/uL (ref 4.22–5.81)
RDW: 13.3 % (ref 11.5–15.5)
WBC: 6.5 10*3/uL (ref 4.0–10.5)

## 2015-10-11 LAB — BASIC METABOLIC PANEL
Anion gap: 10 (ref 5–15)
BUN: 12 mg/dL (ref 6–20)
CALCIUM: 9.5 mg/dL (ref 8.9–10.3)
CHLORIDE: 106 mmol/L (ref 101–111)
CO2: 23 mmol/L (ref 22–32)
Creatinine, Ser: 1.35 mg/dL — ABNORMAL HIGH (ref 0.61–1.24)
GFR calc non Af Amer: >60 mL/min (ref >60)
GLUCOSE: 89 mg/dL (ref 65–99)
Potassium: 4.2 mmol/L (ref 3.5–5.1)
Sodium: 139 mmol/L (ref 135–145)

## 2015-10-11 LAB — I-STAT TROPONIN, ED: TROPONIN I, POC: 0 ng/mL (ref 0.00–0.08)

## 2015-10-11 NOTE — ED Notes (Signed)
Pt states that he has been having chest pain that radiates to his arm and back x 3 days. Pacemaker present. Alert and oriented.

## 2015-10-12 ENCOUNTER — Telehealth: Payer: Self-pay | Admitting: Cardiology

## 2015-10-12 ENCOUNTER — Ambulatory Visit (INDEPENDENT_AMBULATORY_CARE_PROVIDER_SITE_OTHER): Payer: BLUE CROSS/BLUE SHIELD | Admitting: Family Medicine

## 2015-10-12 ENCOUNTER — Encounter: Payer: BLUE CROSS/BLUE SHIELD | Admitting: *Deleted

## 2015-10-12 VITALS — BP 122/76 | HR 78 | Temp 97.8°F | Wt 312.8 lb

## 2015-10-12 DIAGNOSIS — K3184 Gastroparesis: Secondary | ICD-10-CM

## 2015-10-12 LAB — I-STAT TROPONIN, ED: Troponin i, poc: 0.01 ng/mL (ref 0.00–0.08)

## 2015-10-12 LAB — D-DIMER, QUANTITATIVE: D-Dimer, Quant: <0.27 ug/mL-FEU (ref 0.00–0.50)

## 2015-10-12 NOTE — Progress Notes (Signed)
Pre visit review using our clinic review tool, if applicable. No additional management support is needed unless otherwise documented below in the visit note. 

## 2015-10-12 NOTE — Telephone Encounter (Signed)
Spoke with pt and reminded pt of remote transmission that is due today. Pt verbalized understanding.   

## 2015-10-12 NOTE — ED Provider Notes (Signed)
CSN: [id number]     Arrival date & time 10/11/15  2240 History  By signing my name below, I, Jim Hill, attest that this documentation has been prepared under the direction and in the presence of No att. providers found.   Electronically Signed: Iona Beardhristian Hill, ED Scribe. 10/12/2015. 7:08 PM    Chief Complaint  Patient presents with  . Chest Pain    The history is provided by the patient. No language interpreter was used.   HPI Comments: Jim Hill is a 45 y.o. male with PMHx of HTN, obesity, chest pain, and diastolic heart failure who presents to the Emergency Department complaining of gradual onset, intermittent, left sided chest tightness, onset three days ago. Pt reports that the pain radiates down his left arm and back as well. Pain is unprovoked.  He has had similar symptoms in the past. He also complains of associated nausea, leg numbness, faint feeling on exertion, and diaphoresis. 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 Pt states that the chest pain is occasionally worsened with deep breathes. No other worsening or alleviating factors noted. Pt denies shortness of breath, vomiting, or any other pertinent symptoms. 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 Pt is not currently experiencing chest pain.   Past Medical History  Diagnosis Date  . Hypertension   . Chest pain     normal LHC 2008;  ETT-Echo 8/10: normal  . Obesity   . GERD (gastroesophageal reflux disease)   . Symptomatic bradycardia     [place] in 2006 [place]  . Other specified cardiac dysrhythmias(427.89)   . OSA (obstructive sleep apnea)   . SVT (supraventricular tachycardia) (HCC)   . Diastolic heart failure (HCC)   . Sleep apnea   . Barrett's esophagus without dysplasia - subcentimeter changes 06/19/2015  . 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 Gastroparesis 08/19/2015   Past Surgical History  Procedure Laterality  Date  . Insert / replace / remove pacemaker      status post pacemaker implant August 2006  . Appendectomy  1994  . Permanent pacemaker generator change N/A 04/16/2012    Procedure: PERMANENT PACEMAKER GENERATOR CHANGE;  Surgeon: Marinus MawGregg W Taylor, MD;  [facility];  Service: Cardiovascular;  Laterality: N/A;  . Colonoscopy    . Upper gastrointestinal endoscopy     Family History  Problem Relation Age of Onset  . Hypertension Other   . Diabetes Other   . Cancer Other   . CAD Other   . Hypertension Maternal Grandmother   . Hypertension Maternal Grandfather   . Hypertension Paternal Grandmother   . Heart disease Paternal Grandfather   . Hypertension Paternal Grandfather   . Pancreatic cancer Paternal Uncle   . Prostate cancer Paternal Grandfather    Social History  Substance Use Topics  . Smoking status: Never Smoker   . Smokeless tobacco: Never Used  . Alcohol Use: 0.0 oz/week    0 Standard drinks or equivalent per week     Comment: occasional    Review of Systems  Constitutional: Positive for diaphoresis.  Respiratory: Negative for shortness of breath.   Cardiovascular: Positive for chest pain.  Gastrointestinal: Positive for nausea. Negative for vomiting.  Neurological: Positive for numbness.  All other systems reviewed and are negative.   Allergies  Review of patient's allergies indicates no known allergies.  Home Medications   Prior to Admission medications  Medication Sig Start Date End Date Taking? Authorizing Provider  aspirin 81 MG chewable tablet Chew 81 mg by mouth daily.   Yes Historical Provider, MD  esomeprazole (NEXIUM) 40 MG capsule Take 1 capsule (40 mg total) by mouth daily. Patient taking differently: Take 40 mg by mouth at bedtime.  07/19/14  Yes Lorre Munroe, NP  metoCLOPramide (REGLAN) 10 MG tablet Take 1 tablet (10 mg total) by mouth 3 (three) times daily before meals. Patient taking differently: Take 10 mg by mouth 2 (two) times  daily.  08/19/15  Yes Iva Boop, MD  potassium chloride SA (K-DUR,KLOR-CON) 20 MEQ tablet Take 20 mEq by mouth daily. Pt is only taking "every now and then" (02/24/15)   Yes Historical Provider, MD  Probiotic Product (PROBIOTIC PO) Take 1 tablet by mouth every other day.   Yes Historical Provider, MD  ranitidine (ZANTAC) 150 MG tablet Take 1 tab at bedtime, nightly. Patient taking differently: Take 150 mg by mouth daily.  04/29/15  Yes Amy S Esterwood, PA-C  triamterene-hydrochlorothiazide (MAXZIDE-25) 37.5-25 MG tablet Take 1 tablet by mouth daily. 07/01/15  Yes Marinus Maw, MD  sucralfate (CARAFATE) 1 g tablet Take 1 tablet (1 g total) by mouth 2 (two) times daily. 09/06/15   Rebecka Apley, MD   BP 133/86 mmHg  Pulse 85  Temp(Src) 98.1 F (36.7 C) (Oral)  Resp 21  Ht  (1.803 m)  Wt 303 lb (137.44 kg)  BMI 42.28 kg/m2  SpO2 97% Physical Exam  Constitutional: He is oriented to person, place, and time. He appears well-developed and well-nourished.  HENT:  Head: Normocephalic and atraumatic.  Eyes: EOM are normal.  Neck: Normal range of motion.  Cardiovascular: Normal rate, regular rhythm, normal heart sounds and intact distal pulses.   Pulmonary/Chest: Effort normal and breath sounds normal. No respiratory distress.  Abdominal: Soft. He exhibits no distension. There is no tenderness.  Musculoskeletal: Normal range of motion.  Neurological: He is alert and oriented to person, place, and time.  Skin: Skin is warm and dry.  Psychiatric: He has a normal mood and affect. Judgment normal.  Nursing note and vitals reviewed.   ED Course  Procedures (including critical care time) DIAGNOSTIC STUDIES: Oxygen Saturation is 97% on RA, normal by my interpretation.    COORDINATION OF CARE: 4:05 AM-Discussed treatment plan which includes CXR, BMP, CBC, EKG, and I-stat troponin with pt at bedside and pt agreed to plan.    Labs Review Labs Reviewed  BASIC METABOLIC PANEL -  Abnormal; Notable for the following:    Creatinine, Ser 1.35 (*)    All other components within normal limits  CBC  D-DIMER, QUANTITATIVE (NOT AT [facility])  Rosezena Sensor, ED  Rosezena Sensor, ED    Imaging Review Dg Chest 2 View  10/11/2015  CLINICAL DATA:  Chest pain for 1 week. EXAM: CHEST  2 VIEW COMPARISON:  09/06/2015 FINDINGS: Cardiac pacemaker appears unchanged in position since previous study. Normal heart size and pulmonary vascularity. Lungs are clear. No focal airspace disease or consolidation. No blunting of costophrenic angles. No pneumothorax. Mediastinal contours appear intact. Mild degenerative change in the spine. IMPRESSION: No active cardiopulmonary disease. Electronically Signed   By: Burman Nieves M.D.   On: 10/11/2015 23:11   I have personally reviewed and evaluated these images and lab results as part of my medical decision-making.   EKG Interpretation   Date/Time:  Tuesday October 11 2015 22:46:09 EDT Ventricular Rate:  80 PR Interval:  178 QRS Duration: 104 QT Interval:  364 QTC Calculation: 420 R Axis:   15 Text Interpretation:  Sinus rhythm No acute changes No significant change  since last tracing normal intervals and normal axis [id number] pattern  Reconfirmed by Wateen Varon, MD, [place] [phone] on 10/12/2015 3:58:47 AM      MDM   Final diagnoses:  Atypical chest pain    Pt comes in with cc of chest pain. CP is atypical. HEAR score is 2 for the risk factors. We will get trops x 2. If neg, pt will be discharged.  Derwood Kaplan, MD 10/14/15 1910

## 2015-10-12 NOTE — Patient Instructions (Addendum)
Nice to meet you. We will refer you to neurology for further evaluation. Please follow-up with your cardiologist and we will try to arrange this for you. If you develop chest pain, shortness of breath, palpitations, abdominal pain, nausea, vomiting, diarrhea, or any new or changing symptoms please seek medical attention.

## 2015-10-12 NOTE — Discharge Instructions (Signed)
We saw you in the ER for the chest pain/shortness of breath. °All of our cardiac workup is normal, including labs, EKG and chest X-RAY are normal. °We are not sure what is causing your discomfort, but we feel comfortable sending you home at this time. The workup in the ER is not complete, and you should follow up with your primary care doctor for further evaluation. ° °Please return to the ER if you have worsening chest pain, shortness of breath, pain radiating to your jaw, shoulder, or back, sweats or fainting. Otherwise see the Cardiologist or your primary care doctor as requested. ° ° °Nonspecific Chest Pain  °Chest pain can be caused by many different conditions. There is always a chance that your pain could be related to something serious, such as a heart attack or a blood clot in your lungs. Chest pain can also be caused by conditions that are not life-threatening. If you have chest pain, it is very important to follow up with your health care provider. °CAUSES  °Chest pain can be caused by: °· Heartburn. °· Pneumonia or bronchitis. °· Anxiety or stress. °· Inflammation around your heart (pericarditis) or lung (pleuritis or pleurisy). °· A blood clot in your lung. °· A collapsed lung (pneumothorax). It can develop suddenly on its own (spontaneous pneumothorax) or from trauma to the chest. °· Shingles infection (varicella-zoster virus). °· Heart attack. °· Damage to the bones, muscles, and cartilage that make up your chest wall. This can include: °¨ Bruised bones due to injury. °¨ Strained muscles or cartilage due to frequent or repeated coughing or overwork. °¨ Fracture to one or more ribs. °¨ Sore cartilage due to inflammation (costochondritis). °RISK FACTORS  °Risk factors for chest pain may include: °· Activities that increase your risk for trauma or injury to your chest. °· Respiratory infections or conditions that cause frequent coughing. °· Medical conditions or overeating that can cause  heartburn. °· Heart disease or family history of heart disease. °· Conditions or health behaviors that increase your risk of developing a blood clot. °· Having had chicken pox (varicella zoster). °SIGNS AND SYMPTOMS °Chest pain can feel like: °· Burning or tingling on the surface of your chest or deep in your chest. °· Crushing, pressure, aching, or squeezing pain. °· Dull or sharp pain that is worse when you move, cough, or take a deep breath. °· Pain that is also felt in your back, neck, shoulder, or arm, or pain that spreads to any of these areas. °Your chest pain may come and go, or it may stay constant. °DIAGNOSIS °Lab tests or other studies may be needed to find the cause of your pain. Your health care provider may have you take a test called an ambulatory ECG (electrocardiogram). An ECG records your heartbeat patterns at the time the test is performed. You may also have other tests, such as: °· Transthoracic echocardiogram (TTE). During echocardiography, sound waves are used to create a picture of all of the heart structures and to look at how blood flows through your heart. °· Transesophageal echocardiogram (TEE). This is a more advanced imaging test that obtains images from inside your body. It allows your health care provider to see your heart in finer detail. °· Cardiac monitoring. This allows your health care provider to monitor your heart rate and rhythm in real time. °· Holter monitor. This is a portable device that records your heartbeat and can help to diagnose abnormal heartbeats. It allows your health care provider to track your   heart activity for several days, if needed. °· Stress tests. These can be done through exercise or by taking medicine that makes your heart beat more quickly. °· Blood tests. °· Imaging tests. °TREATMENT  °Your treatment depends on what is causing your chest pain. Treatment may include: °· Medicines. These may include: °¨ Acid blockers for heartburn. °¨ Anti-inflammatory  medicine. °¨ Pain medicine for inflammatory conditions. °¨ Antibiotic medicine, if an infection is present. °¨ Medicines to dissolve blood clots. °¨ Medicines to treat coronary artery disease. °· Supportive care for conditions that do not require medicines. This may include: °¨ Resting. °¨ Applying heat or cold packs to injured areas. °¨ Limiting activities until pain decreases. °HOME CARE INSTRUCTIONS °· If you were prescribed an antibiotic medicine, finish it all even if you start to feel better. °· Avoid any activities that bring on chest pain. °· Do not use any tobacco products, including cigarettes, chewing tobacco, or electronic cigarettes. If you need help quitting, ask your health care provider. °· Do not drink alcohol. °· Take medicines only as directed by your health care provider. °· Keep all follow-up visits as directed by your health care provider. This is important. This includes any further testing if your chest pain does not go away. °· If heartburn is the cause for your chest pain, you may be told to keep your head raised (elevated) while sleeping. This reduces the chance that acid will go from your stomach into your esophagus. °· Make lifestyle changes as directed by your health care provider. These may include: °¨ Getting regular exercise. Ask your health care provider to suggest some activities that are safe for you. °¨ Eating a heart-healthy diet. A registered dietitian can help you to learn healthy eating options. °¨ Maintaining a healthy weight. °¨ Managing diabetes, if necessary. °¨ Reducing stress. °SEEK MEDICAL CARE IF: °· Your chest pain does not go away after treatment. °· You have a rash with blisters on your chest. °· You have a fever. °SEEK IMMEDIATE MEDICAL CARE IF:  °· Your chest pain is worse. °· You have an increasing cough, or you cough up blood. °· You have severe abdominal pain. °· You have severe weakness. °· You faint. °· You have chills. °· You have sudden, unexplained chest  discomfort. °· You have sudden, unexplained discomfort in your arms, back, neck, or jaw. °· You have shortness of breath at any time. °· You suddenly start to sweat, or your skin gets clammy. °· You feel nauseous or you vomit. °· You suddenly feel light-headed or dizzy. °· Your heart begins to beat quickly, or it feels like it is skipping beats. °These symptoms may represent a serious problem that is an emergency. Do not wait to see if the symptoms will go away. Get medical help right away. Call your local emergency services (911 in the [place] Do not drive yourself to the hospital. °  °This information is not intended to replace advice given to you by your health care provider. Make sure you discuss any questions you have with your health care provider. °  °Document Released: 04/18/2005 Document Revised: 07/30/2014 Document Reviewed: 02/12/2014 °Elsevier Interactive Patient Education ©2016 [employer] ° °

## 2015-10-13 ENCOUNTER — Encounter: Payer: Self-pay | Admitting: Family Medicine

## 2015-10-13 ENCOUNTER — Ambulatory Visit: Payer: BLUE CROSS/BLUE SHIELD | Admitting: Family Medicine

## 2015-10-13 NOTE — Progress Notes (Signed)
Patient ID: Jim Hill, male   DOB: [birth year], 45 y.o.   MRN: [id number]  Marikay Alar, MD Phone: [phone]  Jim Hill is a 45 y.o. male who presents today for same-day visit.  Patient presents in follow-up today for a chronic issue that he was just evaluated in the emergency room for. Patient notes for a number of days he has had a pain from his stomach up to his chest into his arms and shoulder. Notes he has had this issue intermittently for a number of years. Describes it as a pressure sensation. Notes it occurs in the top of his stomach. He notes the chest pain has gone away. He typically only gets this discomfort when he drives. He has no shortness of breath with this. He had a negative d-dimer and negative troponins in the emergency room. He had a negative chest x-ray. He had an EKG with no ischemic changes. He reports no discomfort at this time. He notes he has chronic pain in his stomach and has been evaluated by GI and diagnosed gastroparesis. He notes he has a cardiologist and had a stress test in November of last year that was felt to be low risk. He is frustrated because nobody has been able to figure out why he has the stomach issues in this other discomfort. He would like a referral to neurology to see if they could shed some light on his reason for autonomic dysfunction. He is asymptomatic at this time.  PMH: nonsmoker.   ROS see history of present illness  Objective  Physical Exam Filed Vitals:   10/12/15 1614  BP: 122/76  Pulse: 78  Temp: 97.8 F (36.6 C)    BP Readings from Last 3 Encounters:  10/12/15 122/76  10/12/15 109/70  09/06/15 128/68   Wt Readings from Last 3 Encounters:  10/12/15 312 lb 12.8 oz (141.885 kg)  10/11/15 303 lb (137.44 kg)  09/06/15 305 lb (138.347 kg)    Physical Exam  Constitutional: He is well-developed, well-nourished, and in no distress.  HENT:  Head: Normocephalic and atraumatic.  Right Ear: External ear normal.    Left Ear: External ear normal.  Mouth/Throat: Oropharynx is clear and moist. No oropharyngeal exudate.  Eyes: Conjunctivae are normal. Pupils are equal, round, and reactive to light.  Neck: Neck supple.  Cardiovascular: Normal rate, regular rhythm and normal heart sounds.  Exam reveals no gallop and no friction rub.   No murmur heard. Pulmonary/Chest: Effort normal and breath sounds normal. No respiratory distress. He has no wheezes. He has no rales. He exhibits no tenderness.  Abdominal: Soft. Bowel sounds are normal. He exhibits no distension. There is no tenderness. There is no rebound and no guarding.  Lymphadenopathy:    He has no cervical adenopathy.  Neurological: He is alert. Gait normal.  Skin: Skin is warm and dry. He is not diaphoretic.     Assessment/Plan: Please see individual problem list.  Gastroparesis Patient underwent evaluation for his discomfort in the emergency room earlier today. This was unrevealing for cause. He follows up today requesting referral to neurology for further evaluation. The symptoms have resolved at this time. Symptoms could be cardiac in nature though would seem less likely with negative troponins and nonischemic EKG. Unlikely to be VTE in nature given negative d-dimer. Could be GI related given his history of gastroparesis and reflux. I advised the patient that he needed to follow-up with his cardiologist for further evaluation. He should also continue follow-up with his GI physician.  At his request we'll refer him to neurology for further evaluation to see if there is a neurological component. He will continue to monitor for recurrence of symptoms. He is given return precautions.    Orders Placed This Encounter  Procedures  . Ambulatory referral to Neurology    Referral Priority:  Routine    Referral Type:  Consultation    Referral Reason:  Specialty Services Required    Requested Specialty:  Neurology    Number of Visits Requested:  1     Marikay AlarEric Tanetta Fuhriman, MD [facility] [place]

## 2015-10-13 NOTE — Assessment & Plan Note (Addendum)
Patient underwent evaluation for his discomfort in the emergency room earlier today. This was unrevealing for cause. He follows up today requesting referral to neurology for further evaluation. The symptoms have resolved at this time. Symptoms could be cardiac in nature though would seem less likely with negative troponins and nonischemic EKG. Unlikely to be VTE in nature given negative d-dimer. Could be GI related given his history of gastroparesis and reflux. I advised the patient that he needed to follow-up with his cardiologist for further evaluation. He should also continue follow-up with his GI physician. At his request we'll refer him to neurology for further evaluation to see if there is a neurological component. He will continue to monitor for recurrence of symptoms. He is given return precautions.

## 2015-10-14 ENCOUNTER — Encounter: Payer: Self-pay | Admitting: Cardiology

## 2015-10-19 ENCOUNTER — Telehealth: Payer: Self-pay | Admitting: Internal Medicine

## 2015-10-19 NOTE — Telephone Encounter (Signed)
Left message for patient to call back  

## 2015-10-20 ENCOUNTER — Other Ambulatory Visit: Payer: Self-pay | Admitting: Internal Medicine

## 2015-10-20 NOTE — Telephone Encounter (Signed)
No answer on phone today .  I will try again tomorrow

## 2015-10-21 NOTE — Telephone Encounter (Signed)
[facility] was seen at [facility].  The plan was for a trial of reglan and erythromycin alternating therapy, but unable to afford erythromycin.  He feels they did not really address his concerns and "acted like they didn't know why I was there".  I placed the notes on your desk from Care Everywhere.  I have encouraged him to contact [facility] for an alternative to Erythromycin.  I did place him on your scheduled for 12/05/15.  Dr. Leone PayorGessner please review notes from [facility] and advise if you have additional recommendations until office visit with you

## 2015-10-24 ENCOUNTER — Ambulatory Visit: Payer: BLUE CROSS/BLUE SHIELD | Admitting: Internal Medicine

## 2015-10-24 DIAGNOSIS — Z0289 Encounter for other administrative examinations: Secondary | ICD-10-CM

## 2015-10-25 ENCOUNTER — Telehealth: Payer: Self-pay | Admitting: Internal Medicine

## 2015-10-25 NOTE — Telephone Encounter (Signed)
No follow up needed

## 2015-10-25 NOTE — Telephone Encounter (Signed)
I contacted the patient about recommendations. He stated that [facility] called him back and he is going to start on the erythromycin/reglan therapy.  They are also going to set him up for a EGD to biopsy for sarcoidosis.  He will call back if the coupon they provided for the erythromycin does not help and we will send in the Reglan.  He will keep follow up for May with Dr. Leone PayorGessner

## 2015-10-25 NOTE — Telephone Encounter (Signed)
He has a difficult problem which is why I [facility] - sorry he was not satisfied with that visit   We can change to liquid metaclopramide same dose, sig, disp 1 month supply w/ 3 RF  He should be on a gastroparesis diet if not and go down from step 3 to 2 to 1 depending upon how he is doing

## 2015-10-25 NOTE — Telephone Encounter (Signed)
Patient did not come for their scheduled appointment 4/3 [place] follow up.  Please let me know if the patient needs to be contacted immediately for follow up or if no follow up is necessary.

## 2015-11-10 ENCOUNTER — Encounter: Payer: Self-pay | Admitting: Internal Medicine

## 2015-11-20 ENCOUNTER — Emergency Department (HOSPITAL_COMMUNITY): Payer: BLUE CROSS/BLUE SHIELD

## 2015-11-20 ENCOUNTER — Encounter (HOSPITAL_COMMUNITY): Payer: Self-pay | Admitting: *Deleted

## 2015-11-20 ENCOUNTER — Emergency Department (HOSPITAL_COMMUNITY)
Admission: EM | Admit: 2015-11-20 | Discharge: 2015-11-21 | Disposition: A | Discharge status: Home / Self Care | Payer: BLUE CROSS/BLUE SHIELD | Attending: Emergency Medicine | Admitting: Emergency Medicine

## 2015-11-20 DIAGNOSIS — K3184 Gastroparesis: Secondary | ICD-10-CM | POA: Insufficient documentation

## 2015-11-20 DIAGNOSIS — R51 Headache: Secondary | ICD-10-CM | POA: Diagnosis not present

## 2015-11-20 DIAGNOSIS — I110 Hypertensive heart disease with heart failure: Secondary | ICD-10-CM | POA: Insufficient documentation

## 2015-11-20 DIAGNOSIS — Z79899 Other long term (current) drug therapy: Secondary | ICD-10-CM | POA: Diagnosis not present

## 2015-11-20 DIAGNOSIS — R0789 Other chest pain: Secondary | ICD-10-CM | POA: Insufficient documentation

## 2015-11-20 DIAGNOSIS — Z7982 Long term (current) use of aspirin: Secondary | ICD-10-CM | POA: Diagnosis not present

## 2015-11-20 DIAGNOSIS — G4733 Obstructive sleep apnea (adult) (pediatric): Secondary | ICD-10-CM | POA: Diagnosis not present

## 2015-11-20 DIAGNOSIS — E669 Obesity, unspecified: Secondary | ICD-10-CM | POA: Diagnosis not present

## 2015-11-20 DIAGNOSIS — Z950 Presence of cardiac pacemaker: Secondary | ICD-10-CM | POA: Insufficient documentation

## 2015-11-20 DIAGNOSIS — I5030 Unspecified diastolic (congestive) heart failure: Secondary | ICD-10-CM | POA: Insufficient documentation

## 2015-11-20 DIAGNOSIS — K219 Gastro-esophageal reflux disease without esophagitis: Secondary | ICD-10-CM | POA: Insufficient documentation

## 2015-11-20 LAB — BASIC METABOLIC PANEL
ANION GAP: 10 (ref 5–15)
BUN: 13 mg/dL (ref 6–20)
CALCIUM: 9 mg/dL (ref 8.9–10.3)
CO2: 22 mmol/L (ref 22–32)
CREATININE: 1.18 mg/dL (ref 0.61–1.24)
Chloride: 107 mmol/L (ref 101–111)
Glucose, Bld: 122 mg/dL — ABNORMAL HIGH (ref 65–99)
POTASSIUM: 4 mmol/L (ref 3.5–5.1)
SODIUM: 139 mmol/L (ref 135–145)

## 2015-11-20 LAB — CBC
HCT: 39.1 % (ref 39.0–52.0)
Hemoglobin: 13.1 g/dL (ref 13.0–17.0)
MCH: 27.9 pg (ref 26.0–34.0)
MCHC: 33.5 g/dL (ref 30.0–36.0)
MCV: 83.4 fL (ref 78.0–100.0)
PLATELETS: 211 10*3/uL (ref 150–400)
RBC: 4.69 MIL/uL (ref 4.22–5.81)
RDW: 13.3 % (ref 11.5–15.5)
WBC: 4.9 10*3/uL (ref 4.0–10.5)

## 2015-11-20 LAB — I-STAT TROPONIN, ED: TROPONIN I, POC: 0 ng/mL (ref 0.00–0.08)

## 2015-11-20 MED ORDER — ACETAMINOPHEN 500 MG PO TABS
1000.0000 mg | ORAL_TABLET | Freq: Once | ORAL | Status: AC
  Administered 2015-11-20: 1000 mg via ORAL
  Filled 2015-11-20: qty 2

## 2015-11-20 MED ORDER — ASPIRIN 81 MG PO CHEW
324.0000 mg | CHEWABLE_TABLET | Freq: Once | ORAL | Status: AC
  Administered 2015-11-20: 324 mg via ORAL
  Filled 2015-11-20: qty 4

## 2015-11-20 NOTE — ED Notes (Signed)
UNABLE TO COLLECT LABS AT THIS TIME PATIENT GETTING A XRAY 

## 2015-11-20 NOTE — ED Notes (Signed)
Pt complains of chest pain. Pt has pacemaker, states he felt a shock in his chest, he then felt numb in his lip, a headache, and back pain. Pt states he has mild chest pain right now. Pt has medtronic pacemaker, installed in 2006.

## 2015-11-20 NOTE — ED Provider Notes (Signed)
CSN: [id number]     Arrival date & time 11/20/15  2031 History   First MD Initiated Contact with Patient 11/20/15 2111     Chief Complaint  Patient presents with  . Chest Pain  . Headache     (Consider location/radiation/quality/duration/timing/severity/associated sxs/prior Treatment) Patient is a 45 y.o. male presenting with chest pain and headaches. The history is provided by the patient.  Chest Pain Pain location:  L chest (upper chest) Pain quality: sharp   Pain radiates to:  Does not radiate Pain radiates to the back: no   Pain severity:  Moderate Onset quality:  Gradual Duration: 5 minutes then went away. Timing:  Sporadic Progression:  Resolved Chronicity:  Recurrent Context: at rest   Context: not breathing   Relieved by:  Nothing Worsened by:  Nothing tried Ineffective treatments:  None tried Associated symptoms: headache   Risk factors: hypertension and male sex   Risk factors: no coronary artery disease, no diabetes mellitus, no high cholesterol, no immobilization and no smoking   Headache   Past Medical History  Diagnosis Date  . Hypertension   . Chest pain     normal LHC 2008;  ETT-Echo 8/10: normal  . Obesity   . GERD (gastroesophageal reflux disease)   . Symptomatic bradycardia     [place]  . Other specified cardiac dysrhythmias(427.89)   . OSA (obstructive sleep apnea)   . SVT (supraventricular tachycardia) (HCC)   . Diastolic heart failure (HCC)   . Sleep apnea   . Barrett's esophagus without dysplasia - subcentimeter changes 06/19/2015  . 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 Gastroparesis 08/19/2015   Past Surgical History  Procedure Laterality Date  . Insert / replace / remove pacemaker      status post pacemaker implant August 2006  . Appendectomy  1994  . Permanent pacemaker generator change N/A 04/16/2012    Procedure: PERMANENT PACEMAKER GENERATOR CHANGE;  Surgeon: Marinus MawGregg W Taylor, MD;  [facility];   Service: Cardiovascular;  Laterality: N/A;  . Colonoscopy    . Upper gastrointestinal endoscopy     Family History  Problem Relation Age of Onset  . Hypertension Other   . Diabetes Other   . Cancer Other   . CAD Other   . Hypertension Maternal Grandmother   . Hypertension Maternal Grandfather   . Hypertension Paternal Grandmother   . Heart disease Paternal Grandfather   . Hypertension Paternal Grandfather   . Pancreatic cancer Paternal Uncle   . Prostate cancer Paternal Grandfather    Social History  Substance Use Topics  . Smoking status: Never Smoker   . Smokeless tobacco: Never Used  . Alcohol Use: 0.0 oz/week    0 Standard drinks or equivalent per week     Comment: occasional    Review of Systems  Cardiovascular: Positive for chest pain.  Neurological: Positive for headaches.  All other systems reviewed and are negative.     Allergies  Lactose intolerance (gi) and Pork-derived products  Home Medications   Prior to Admission medications   Medication Sig Start Date End Date Taking? Authorizing Provider  aspirin 81 MG chewable tablet Chew 81 mg by mouth daily.   Yes Historical Provider, MD  esomeprazole (NEXIUM) 40 MG capsule Take 1 capsule (40 mg total) by mouth daily. Patient taking differently: Take 40 mg by mouth at bedtime.  07/19/14  Yes Lorre Munroeegina W Baity, NP  metoCLOPramide (REGLAN) 10 MG tablet Take 1 tablet (10 mg total)  by mouth 3 (three) times daily before meals. Patient taking differently: Take 10 mg by mouth 2 (two) times daily.  08/19/15  Yes Iva Boop, MD  potassium chloride SA (K-DUR,KLOR-CON) 20 MEQ tablet Take 20 mEq by mouth daily. Pt is only taking "every now and then" (02/24/15)   Yes Historical Provider, MD  Probiotic Product (PROBIOTIC PO) Take 1 tablet by mouth every other day.   Yes Historical Provider, MD  ranitidine (ZANTAC) 150 MG tablet Take 1 tab at bedtime, nightly. Patient taking differently: Take 150 mg by mouth daily.  04/29/15  Yes  Amy S Esterwood, PA-C  sucralfate (CARAFATE) 1 g tablet Take 1 tablet (1 g total) by mouth 2 (two) times daily. Patient taking differently: Take 1 g by mouth 2 (two) times daily as needed (STOMACH).  09/06/15  Yes Rebecka Apley, MD  triamterene-hydrochlorothiazide (MAXZIDE-25) 37.5-25 MG tablet Take 1 tablet by mouth daily. 07/01/15  Yes Marinus Maw, MD   BP 163/97 mmHg  Pulse 92  Temp(Src) 98.3 F (36.8 C) (Oral)  Resp 18  SpO2 95% Physical Exam  Constitutional: He is oriented to person, place, and time. He appears well-developed and well-nourished. No distress.  HENT:  Head: Normocephalic and atraumatic.  Eyes: Conjunctivae are normal.  Neck: Neck supple. No tracheal deviation present.  Cardiovascular: Normal rate, regular rhythm and normal heart sounds.   Pulmonary/Chest: Effort normal and breath sounds normal. No respiratory distress. He exhibits no tenderness.  Abdominal: Soft. He exhibits no distension. There is no tenderness.  Neurological: He is alert and oriented to person, place, and time.  Skin: Skin is warm and dry.  Psychiatric: He has a normal mood and affect.  Vitals reviewed.   ED Course  Procedures (including critical care time) Labs Review Labs Reviewed  BASIC METABOLIC PANEL - Abnormal; Notable for the following:    Glucose, Bld 122 (*)    All other components within normal limits  CBC  I-STAT TROPOININ, ED  I-STAT TROPOININ, ED    Imaging Review Dg Chest Port 1 View  11/20/2015  CLINICAL DATA:  Pt has pacemaker, states he felt a shock in his chest, he then felt numb in his lip, a headache, and back pain. Pt states he has mild chest pain right now. Pt has medtronic pacemaker, installed in 2006. Hx htn, OSA, SVT, diastolic heart failure EXAM: PORTABLE CHEST 1 VIEW COMPARISON:  10/11/2015 FINDINGS: Cardiac pacemaker with lead tips over the RA and RV regions without change in position. Mild cardiac enlargement. No pulmonary vascular congestion. No focal  airspace disease or consolidation. No blunting of costophrenic angles. No pneumothorax. Mediastinal contours appear intact. IMPRESSION: Mild cardiac enlargement.  No evidence of active pulmonary disease. Electronically Signed   By: Burman Nieves M.D.   On: 11/20/2015 21:51   I have personally reviewed and evaluated these images and lab results as part of my medical decision-making.   EKG Interpretation   Date/Time:  Sunday November 20 2015 20:37:00 EDT Ventricular Rate:  94 PR Interval:  175 QRS Duration: 105 QT Interval:  362 QTC Calculation: 453 R Axis:   89 Text Interpretation:  Sinus rhythm Probable inferior infarct, old No  significant change since last tracing Confirmed by Farmer Mccahill MD, Alaycia Eardley  ([id number]) on 11/20/2015 9:29:27 PM      MDM   Final diagnoses:  Atypical chest pain    45 y.o. male presents with Sharp left upper chest pain that started 3 hours prior to arrival. He noticed some  mild headache and felt some lip numbness during the episode but it went away completely and spontaneously without any interventions. He is hemodynamically stable on arrival, no obvious problem with his pacemaker. His EKG is unchanged from previous. Leads appear in the correct location. Troponin is negative and highly atypical nature with minimal risk factors for ACS as his pacer was placed remotely for symptomatic bradycardia.  Difficult with interrogation of [employer]tr[employer] device. Unable to obtain report from the company. Patient remained asymptomatic throughout his emergency department course. Plan will be for delta troponin and pacer interrogation report review when available. A local representative is coming to see the patient and trouble shoot the device. Dr. Rhunette Croft will follow up on this prior to likely discharge and patient had follow-up scheduled with his primary care physician for tomorrow which is appropriate.   Lyndal Pulley, MD 11/21/15 [phone]

## 2015-11-21 ENCOUNTER — Telehealth: Payer: Self-pay | Admitting: Internal Medicine

## 2015-11-21 ENCOUNTER — Ambulatory Visit: Payer: BLUE CROSS/BLUE SHIELD | Admitting: Internal Medicine

## 2015-11-21 DIAGNOSIS — Z0289 Encounter for other administrative examinations: Secondary | ICD-10-CM

## 2015-11-21 LAB — I-STAT TROPONIN, ED: TROPONIN I, POC: 0 ng/mL (ref 0.00–0.08)

## 2015-11-21 NOTE — Telephone Encounter (Signed)
I did not see anything mentioned in the note about referral to urology. Dr. De NurseSonneberg placed referral to neurology but it was denied- Shirlee Limer[place], why was this? I am also not going to be putting in referrals for things I have not seen him for.

## 2015-11-21 NOTE — Telephone Encounter (Signed)
[facility] denied the Referral because they dont see patients for Gastroparesis.

## 2015-11-21 NOTE — Discharge Instructions (Signed)
Nonspecific Chest Pain  °Chest pain can be caused by many different conditions. There is always a chance that your pain could be related to something serious, such as a heart attack or a blood clot in your lungs. Chest pain can also be caused by conditions that are not life-threatening. If you have chest pain, it is very important to follow up with your health care provider. °CAUSES  °Chest pain can be caused by: °· Heartburn. °· Pneumonia or bronchitis. °· Anxiety or stress. °· Inflammation around your heart (pericarditis) or lung (pleuritis or pleurisy). °· A blood clot in your lung. °· A collapsed lung (pneumothorax). It can develop suddenly on its own (spontaneous pneumothorax) or from trauma to the chest. °· Shingles infection (varicella-zoster virus). °· Heart attack. °· Damage to the bones, muscles, and cartilage that make up your chest wall. This can include: °¨ Bruised bones due to injury. °¨ Strained muscles or cartilage due to frequent or repeated coughing or overwork. °¨ Fracture to one or more ribs. °¨ Sore cartilage due to inflammation (costochondritis). °RISK FACTORS  °Risk factors for chest pain may include: °· Activities that increase your risk for trauma or injury to your chest. °· Respiratory infections or conditions that cause frequent coughing. °· Medical conditions or overeating that can cause heartburn. °· Heart disease or family history of heart disease. °· Conditions or health behaviors that increase your risk of developing a blood clot. °· Having had chicken pox (varicella zoster). °SIGNS AND SYMPTOMS °Chest pain can feel like: °· Burning or tingling on the surface of your chest or deep in your chest. °· Crushing, pressure, aching, or squeezing pain. °· Dull or sharp pain that is worse when you move, cough, or take a deep breath. °· Pain that is also felt in your back, neck, shoulder, or arm, or pain that spreads to any of these areas. °Your chest pain may come and go, or it may stay  constant. °DIAGNOSIS °Lab tests or other studies may be needed to find the cause of your pain. Your health care provider may have you take a test called an ambulatory ECG (electrocardiogram). An ECG records your heartbeat patterns at the time the test is performed. You may also have other tests, such as: °· Transthoracic echocardiogram (TTE). During echocardiography, sound waves are used to create a picture of all of the heart structures and to look at how blood flows through your heart. °· Transesophageal echocardiogram (TEE). This is a more advanced imaging test that obtains images from inside your body. It allows your health care provider to see your heart in finer detail. °· Cardiac monitoring. This allows your health care provider to monitor your heart rate and rhythm in real time. °· Holter monitor. This is a portable device that records your heartbeat and can help to diagnose abnormal heartbeats. It allows your health care provider to track your heart activity for several days, if needed. °· Stress tests. These can be done through exercise or by taking medicine that makes your heart beat more quickly. °· Blood tests. °· Imaging tests. °TREATMENT  °Your treatment depends on what is causing your chest pain. Treatment may include: °· Medicines. These may include: °¨ Acid blockers for heartburn. °¨ Anti-inflammatory medicine. °¨ Pain medicine for inflammatory conditions. °¨ Antibiotic medicine, if an infection is present. °¨ Medicines to dissolve blood clots. °¨ Medicines to treat coronary artery disease. °· Supportive care for conditions that do not require medicines. This may include: °¨ Resting. °¨ Applying heat   or cold packs to injured areas. °¨ Limiting activities until pain decreases. °HOME CARE INSTRUCTIONS °· If you were prescribed an antibiotic medicine, finish it all even if you start to feel better. °· Avoid any activities that bring on chest pain. °· Do not use any tobacco products, including  cigarettes, chewing tobacco, or electronic cigarettes. If you need help quitting, ask your health care provider. °· Do not drink alcohol. °· Take medicines only as directed by your health care provider. °· Keep all follow-up visits as directed by your health care provider. This is important. This includes any further testing if your chest pain does not go away. °· If heartburn is the cause for your chest pain, you may be told to keep your head raised (elevated) while sleeping. This reduces the chance that acid will go from your stomach into your esophagus. °· Make lifestyle changes as directed by your health care provider. These may include: °¨ Getting regular exercise. Ask your health care provider to suggest some activities that are safe for you. °¨ Eating a heart-healthy diet. A registered dietitian can help you to learn healthy eating options. °¨ Maintaining a healthy weight. °¨ Managing diabetes, if necessary. °¨ Reducing stress. °SEEK MEDICAL CARE IF: °· Your chest pain does not go away after treatment. °· You have a rash with blisters on your chest. °· You have a fever. °SEEK IMMEDIATE MEDICAL CARE IF:  °· Your chest pain is worse. °· You have an increasing cough, or you cough up blood. °· You have severe abdominal pain. °· You have severe weakness. °· You faint. °· You have chills. °· You have sudden, unexplained chest discomfort. °· You have sudden, unexplained discomfort in your arms, back, neck, or jaw. °· You have shortness of breath at any time. °· You suddenly start to sweat, or your skin gets clammy. °· You feel nauseous or you vomit. °· You suddenly feel light-headed or dizzy. °· Your heart begins to beat quickly, or it feels like it is skipping beats. °These symptoms may represent a serious problem that is an emergency. Do not wait to see if the symptoms will go away. Get medical help right away. Call your local emergency services (911 in the [place] Do not drive yourself to the hospital. °  °This  information is not intended to replace advice given to you by your health care provider. Make sure you discuss any questions you have with your health care provider. °  °Document Released: 04/18/2005 Document Revised: 07/30/2014 Document Reviewed: 02/12/2014 °Elsevier Interactive Patient Education ©2016 [employer] ° °

## 2015-11-21 NOTE — Telephone Encounter (Signed)
Ok he will need to follow up with me before referrals are placed

## 2015-11-21 NOTE — Telephone Encounter (Signed)
Pt was in [place] er 4/30.  They wanted him to get a referral to neurology and urology Can you set him up for this.  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

## 2015-11-25 ENCOUNTER — Encounter: Payer: Self-pay | Admitting: Internal Medicine

## 2015-11-25 ENCOUNTER — Ambulatory Visit (INDEPENDENT_AMBULATORY_CARE_PROVIDER_SITE_OTHER): Payer: BLUE CROSS/BLUE SHIELD | Admitting: Internal Medicine

## 2015-11-25 VITALS — BP 142/86 | HR 78 | Temp 98.2°F | Wt 316.5 lb

## 2015-11-25 DIAGNOSIS — R0789 Other chest pain: Secondary | ICD-10-CM

## 2015-11-25 DIAGNOSIS — G909 Disorder of the autonomic nervous system, unspecified: Secondary | ICD-10-CM

## 2015-11-25 NOTE — Progress Notes (Signed)
Pre visit review using our clinic review tool, if applicable. No additional management support is needed unless otherwise documented below in the visit note. 

## 2015-11-25 NOTE — Progress Notes (Signed)
Subjective:    Patient ID: Jim Hill, male    DOB: [birth year], 45 y.o.   MRN: [id number]  HPI  Pt presents to the clinic today for ER follow up for atypical chest pain. He has seen 3 times in the last 3 months for the same issue.  09/06/15- 3 day history of chest pain that radiated into his left shoulder and arm. Associated with nausea and lightheadedness. Pain 8/10. ECG and troponin's were negative. Chest xray was negative. He was treated with a GI cocktail and Toradol.  10/12/15- 3 days of left sided chest tightness, radiating into his left arm and back. Associated with nausea, leg numbness, feeling faint and diaphoretic. 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 Chest xray was normal. ECG was unchanged. Troponin's and D dimer were negative.  11/20/15- Chest pain after feeling a shock in his chest. (he has a pacemaker, not an ICD). Associated with headache, lip numbness and back pain. Chest xray was negative. ECG unchanged from prior. Troponin's were negative.  He reports he has been having chest pain for several years. He describes it as a intermittent left sided, sharp/pulling sensation that resolves without intervention. He has also described the sensation as "food that won't go down." His episodes are associated with some shortness of breath, nausea and pain radiating to the back. He denies diaphoresis or dizziness. He states he cannot elicit these episodes with deep inspiration or any particular movement.  He has had multiple workups by cardiology, pulmonology, and gastroenterology. He has a NM gastric emptying study 212/2016 which showed gastroparesis. He is taking Reglan and following with Dr. Leone Payor. He had a normal stress test 05/2015, and is following with Dr. Ladona Ridgel. He has been evaluated by a pulmonologist which showed normal PFTs and was positive for OSA and he does wear a CPAP. On 04/17, he went to [facility] gastroenterology and had a upper GI study showing  erosive esophagitis, and had biopsy to r/o eosinophil esophagitis, sarcoidosis, and amyloidosis.    He would like a referral to neurology to r/o nerve pain as the cause of his chest pain.  We also did a thorough med review. He is on Zantac and Nexium for his GERD. He reports he occasionally has breakthrough symptoms. His reflux is triggered by acidic foods, caffeine and tomato based products. He is on Reglan for gastroparesis. He takes Carafate as needed for abdominal pain. He takes Maxide 25 for HTN (has not taken today) and takes potassium supplement as needed. He takes 2 baby ASA daily.  Review of Systems      Past Medical History  Diagnosis Date  . Hypertension   . Chest pain     normal LHC 2008;  ETT-Echo 8/10: normal  . Obesity   . GERD (gastroesophageal reflux disease)   . Symptomatic bradycardia     s/p pacer in 2006 in [place]  . Other specified cardiac dysrhythmias(427.89)   . OSA (obstructive sleep apnea)   . SVT (supraventricular tachycardia) (HCC)   . Diastolic heart failure (HCC)   . Sleep apnea   . Barrett's esophagus without dysplasia - subcentimeter changes 06/19/2015  . 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 Gastroparesis 08/19/2015    Current Outpatient Prescriptions  Medication Sig Dispense Refill  . aspirin 81 MG chewable tablet Chew 81 mg by mouth daily.    Marland Kitchen esomeprazole (NEXIUM) 40 MG capsule Take 1 capsule (40 mg total) by mouth daily. (Patient taking differently: Take 40 mg  by mouth at bedtime. ) 30 capsule 2  . metoCLOPramide (REGLAN) 10 MG tablet Take 1 tablet (10 mg total) by mouth 3 (three) times daily before meals. (Patient taking differently: Take 10 mg by mouth 2 (two) times daily. ) 90 tablet 2  . potassium chloride SA (K-DUR,KLOR-CON) 20 MEQ tablet Take 20 mEq by mouth daily. Pt is only taking "every now and then" (02/24/15)    . Probiotic Product (PROBIOTIC PO) Take 1 tablet by mouth every other day.    . ranitidine (ZANTAC) 150 MG tablet Take 1  tab at bedtime, nightly. (Patient taking differently: Take 150 mg by mouth daily. ) 30 tablet 11  . sucralfate (CARAFATE) 1 g tablet Take 1 tablet (1 g total) by mouth 2 (two) times daily. (Patient taking differently: Take 1 g by mouth 2 (two) times daily as needed (STOMACH). ) 20 tablet 0  . triamterene-hydrochlorothiazide (MAXZIDE-25) 37.5-25 MG tablet Take 1 tablet by mouth daily. 30 tablet 9   No current facility-administered medications for this visit.    Allergies  Allergen Reactions  . Lactose Intolerance (Gi) Nausea And Vomiting  . Pork-Derived Products Nausea And Vomiting and Other (See Comments)    HEADache     Family History  Problem Relation Age of Onset  . Hypertension Other   . Diabetes Other   . Cancer Other   . CAD Other   . Hypertension Maternal Grandmother   . Hypertension Maternal Grandfather   . Hypertension Paternal Grandmother   . Heart disease Paternal Grandfather   . Hypertension Paternal Grandfather   . Pancreatic cancer Paternal Uncle   . Prostate cancer Paternal Grandfather     Social History   Social History  . Marital Status: Single    Spouse Name: N/A  . Number of Children: N/A  . Years of Education: N/A   Occupational History  . truck driver    Social History Main Topics  . Smoking status: Never Smoker   . Smokeless tobacco: Never Used  . Alcohol Use: 0.0 oz/week    0 Standard drinks or equivalent per week     Comment: occasional  . Drug Use: No  . Sexual Activity: Yes   Other Topics Concern  . Not on file   Social History Narrative   Tobacco history none. He does admit to using recreational drugs in the past. He has not smoked marijuana in 7 years. 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 Both of his parents are in there 50 s and are  in good health.     Constitutional: Denies fever, malaise, fatigue, headache or abrupt weight changes.  Respiratory: Pt reports intermittent shortness of  breath. Denies difficulty breathing, cough or sputum production.   Cardiovascular: Pt reports intermittent chest pain, not experiencing it today. Denies chest pain, chest tightness, palpitations or swelling in the hands or feet.  Gastrointestinal: Pt reports intermittent abdominal pain. Denies bloating, constipation, diarrhea or blood in the stool.  Musculoskeletal: Denies weakness . Neurological: Denies dizziness.  No other specific complaints in a complete review of systems (except as listed in HPI above).  Objective:   Physical Exam  BP 142/86 mmHg  Pulse 78  Temp(Src) 98.2 F (36.8 C) (Oral)  Wt 316 lb 8 oz (143.563 kg)  SpO2 98% Wt Readings from Last 3 Encounters:  11/25/15 316 lb 8 oz (143.563 kg)  10/12/15 312 lb 12.8 oz (141.885 kg)  10/11/15 303 lb (137.44 kg)  General: Appears his stated age, obese and in NAD. Skin: Warm, dry and intact. No rashes noted. Cardiovascular: Normal rate and rhythm. S1,S2 noted.  No murmur, rubs or gallops noted. No JVD or BLE edema. Pacemaker noted in left upper chest. Pulmonary/Chest: Normal effort and positive vesicular breath sounds. No respiratory distress. No wheezes, rales or ronchi noted.  Abdomen: Slight TTP of epigastric and suprapubic areas. Soft  with normal bowel sounds. No distention or masses noted. Liver, spleen and kidneys non palpable. MSK: Chest pain not reproduced with palpation.  BMET    Component Value Date/Time   NA 139 11/20/2015 2146   NA 138 11/10/2014 0106   K 4.0 11/20/2015 2146   K 3.5 11/10/2014 0106   CL 107 11/20/2015 2146   CL 108 11/10/2014 0106   CO2 22 11/20/2015 2146   CO2 26 11/10/2014 0106   GLUCOSE 122* 11/20/2015 2146   GLUCOSE 106* 11/10/2014 0106   BUN 13 11/20/2015 2146   BUN 16 11/10/2014 0106   CREATININE 1.18 11/20/2015 2146   CREATININE 1.24 05/23/2015 1059   CREATININE 1.37* 11/10/2014 0106   CALCIUM 9.0 11/20/2015 2146   CALCIUM 8.7* 11/10/2014 0106   GFRNONAA >60 11/20/2015  2146   GFRNONAA >60 11/10/2014 0106   GFRNONAA 57* 07/29/2014 1506   GFRAA >60 11/20/2015 2146   GFRAA >60 11/10/2014 0106   GFRAA >60 07/29/2014 1506    Lipid Panel     Component Value Date/Time   CHOL 139 05/23/2015 1059   TRIG 152* 05/23/2015 1059   HDL 32* 05/23/2015 1059   CHOLHDL 4.3 05/23/2015 1059   VLDL 30 05/23/2015 1059   LDLCALC 77 05/23/2015 1059    CBC    Component Value Date/Time   WBC 4.9 11/20/2015 2146   WBC 5.7 11/10/2014 0106   RBC 4.69 11/20/2015 2146   RBC 4.61 11/10/2014 0106   HGB 13.1 11/20/2015 2146   HGB 12.7* 11/10/2014 0106   HCT 39.1 11/20/2015 2146   HCT 38.5* 11/10/2014 0106   PLT 211 11/20/2015 2146   PLT 204 11/10/2014 0106   MCV 83.4 11/20/2015 2146   MCV 84 11/10/2014 0106   MCH 27.9 11/20/2015 2146   MCH 27.5 11/10/2014 0106   MCHC 33.5 11/20/2015 2146   MCHC 32.9 11/10/2014 0106   RDW 13.3 11/20/2015 2146   RDW 14.5 11/10/2014 0106   LYMPHSABS 2.5 01/31/2015 0244   MONOABS 0.6 01/31/2015 0244   EOSABS 0.1 01/31/2015 0244   BASOSABS 0.0 01/31/2015 0244    Hgb A1C No results found for: HGBA1C       Assessment & Plan:   Atypical chest pain with possible autonomic dysfunction  ER notes, labs and imaging reviewed GI, pulmonology and cardiology notes reviewed Continue all current medications at this time Referral placed for Neurology- see Shirlee Limerick on the way out to schedule  Greater than 50% of the time spent counseling and coordinating care. Approx time spent with patient, 45 minutes.  Make an appt for your annual exam

## 2015-11-25 NOTE — Patient Instructions (Signed)
Nonspecific Chest Pain  °Chest pain can be caused by many different conditions. There is always a chance that your pain could be related to something serious, such as a heart attack or a blood clot in your lungs. Chest pain can also be caused by conditions that are not life-threatening. If you have chest pain, it is very important to follow up with your health care provider. °CAUSES  °Chest pain can be caused by: °· Heartburn. °· Pneumonia or bronchitis. °· Anxiety or stress. °· Inflammation around your heart (pericarditis) or lung (pleuritis or pleurisy). °· A blood clot in your lung. °· A collapsed lung (pneumothorax). It can develop suddenly on its own (spontaneous pneumothorax) or from trauma to the chest. °· Shingles infection (varicella-zoster virus). °· Heart attack. °· Damage to the bones, muscles, and cartilage that make up your chest wall. This can include: °¨ Bruised bones due to injury. °¨ Strained muscles or cartilage due to frequent or repeated coughing or overwork. °¨ Fracture to one or more ribs. °¨ Sore cartilage due to inflammation (costochondritis). °RISK FACTORS  °Risk factors for chest pain may include: °· Activities that increase your risk for trauma or injury to your chest. °· Respiratory infections or conditions that cause frequent coughing. °· Medical conditions or overeating that can cause heartburn. °· Heart disease or family history of heart disease. °· Conditions or health behaviors that increase your risk of developing a blood clot. °· Having had chicken pox (varicella zoster). °SIGNS AND SYMPTOMS °Chest pain can feel like: °· Burning or tingling on the surface of your chest or deep in your chest. °· Crushing, pressure, aching, or squeezing pain. °· Dull or sharp pain that is worse when you move, cough, or take a deep breath. °· Pain that is also felt in your back, neck, shoulder, or arm, or pain that spreads to any of these areas. °Your chest pain may come and go, or it may stay  constant. °DIAGNOSIS °Lab tests or other studies may be needed to find the cause of your pain. Your health care provider may have you take a test called an ambulatory ECG (electrocardiogram). An ECG records your heartbeat patterns at the time the test is performed. You may also have other tests, such as: °· Transthoracic echocardiogram (TTE). During echocardiography, sound waves are used to create a picture of all of the heart structures and to look at how blood flows through your heart. °· Transesophageal echocardiogram (TEE). This is a more advanced imaging test that obtains images from inside your body. It allows your health care provider to see your heart in finer detail. °· Cardiac monitoring. This allows your health care provider to monitor your heart rate and rhythm in real time. °· Holter monitor. This is a portable device that records your heartbeat and can help to diagnose abnormal heartbeats. It allows your health care provider to track your heart activity for several days, if needed. °· Stress tests. These can be done through exercise or by taking medicine that makes your heart beat more quickly. °· Blood tests. °· Imaging tests. °TREATMENT  °Your treatment depends on what is causing your chest pain. Treatment may include: °· Medicines. These may include: °¨ Acid blockers for heartburn. °¨ Anti-inflammatory medicine. °¨ Pain medicine for inflammatory conditions. °¨ Antibiotic medicine, if an infection is present. °¨ Medicines to dissolve blood clots. °¨ Medicines to treat coronary artery disease. °· Supportive care for conditions that do not require medicines. This may include: °¨ Resting. °¨ Applying heat   or cold packs to injured areas. °¨ Limiting activities until pain decreases. °HOME CARE INSTRUCTIONS °· If you were prescribed an antibiotic medicine, finish it all even if you start to feel better. °· Avoid any activities that bring on chest pain. °· Do not use any tobacco products, including  cigarettes, chewing tobacco, or electronic cigarettes. If you need help quitting, ask your health care provider. °· Do not drink alcohol. °· Take medicines only as directed by your health care provider. °· Keep all follow-up visits as directed by your health care provider. This is important. This includes any further testing if your chest pain does not go away. °· If heartburn is the cause for your chest pain, you may be told to keep your head raised (elevated) while sleeping. This reduces the chance that acid will go from your stomach into your esophagus. °· Make lifestyle changes as directed by your health care provider. These may include: °¨ Getting regular exercise. Ask your health care provider to suggest some activities that are safe for you. °¨ Eating a heart-healthy diet. A registered dietitian can help you to learn healthy eating options. °¨ Maintaining a healthy weight. °¨ Managing diabetes, if necessary. °¨ Reducing stress. °SEEK MEDICAL CARE IF: °· Your chest pain does not go away after treatment. °· You have a rash with blisters on your chest. °· You have a fever. °SEEK IMMEDIATE MEDICAL CARE IF:  °· Your chest pain is worse. °· You have an increasing cough, or you cough up blood. °· You have severe abdominal pain. °· You have severe weakness. °· You faint. °· You have chills. °· You have sudden, unexplained chest discomfort. °· You have sudden, unexplained discomfort in your arms, back, neck, or jaw. °· You have shortness of breath at any time. °· You suddenly start to sweat, or your skin gets clammy. °· You feel nauseous or you vomit. °· You suddenly feel light-headed or dizzy. °· Your heart begins to beat quickly, or it feels like it is skipping beats. °These symptoms may represent a serious problem that is an emergency. Do not wait to see if the symptoms will go away. Get medical help right away. Call your local emergency services (911 in the [place] Do not drive yourself to the hospital. °  °This  information is not intended to replace advice given to you by your health care provider. Make sure you discuss any questions you have with your health care provider. °  °Document Released: 04/18/2005 Document Revised: 07/30/2014 Document Reviewed: 02/12/2014 °Elsevier Interactive Patient Education ©2016 [employer] ° °

## 2015-11-26 ENCOUNTER — Encounter: Payer: Self-pay | Admitting: Internal Medicine

## 2015-11-28 ENCOUNTER — Encounter: Payer: Self-pay | Admitting: Pulmonary Disease

## 2015-11-28 ENCOUNTER — Telehealth: Payer: Self-pay | Admitting: Internal Medicine

## 2015-11-28 ENCOUNTER — Ambulatory Visit (INDEPENDENT_AMBULATORY_CARE_PROVIDER_SITE_OTHER): Payer: BLUE CROSS/BLUE SHIELD | Admitting: Pulmonary Disease

## 2015-11-28 VITALS — BP 112/78 | HR 80 | Ht 71.0 in | Wt 322.0 lb

## 2015-11-28 DIAGNOSIS — Z9989 Dependence on other enabling machines and devices: Secondary | ICD-10-CM

## 2015-11-28 DIAGNOSIS — Z6841 Body Mass Index (BMI) 40.0 and over, adult: Secondary | ICD-10-CM

## 2015-11-28 DIAGNOSIS — K760 Fatty (change of) liver, not elsewhere classified: Secondary | ICD-10-CM

## 2015-11-28 DIAGNOSIS — G4733 Obstructive sleep apnea (adult) (pediatric): Secondary | ICD-10-CM | POA: Diagnosis not present

## 2015-11-28 NOTE — Patient Instructions (Signed)
Follow up in 1 year.

## 2015-11-28 NOTE — Telephone Encounter (Signed)
Patient advised that he has had recent lab in Feb that I will have Dr. Leone PayorGessner review and I will see if he needs any additional testing and call him back if needed Dr. Leone PayorGessner please review labs from 09/06/15 and advise if needs any additional orders

## 2015-11-28 NOTE — Progress Notes (Signed)
Current Outpatient Prescriptions on File Prior to Visit  Medication Sig  . aspirin 81 MG chewable tablet Chew 81 mg by mouth daily.  Marland Kitchen esomeprazole (NEXIUM) 40 MG capsule Take 1 capsule (40 mg total) by mouth daily. (Patient taking differently: Take 40 mg by mouth at bedtime. )  . metoCLOPramide (REGLAN) 10 MG tablet Take 1 tablet (10 mg total) by mouth 3 (three) times daily before meals. (Patient taking differently: Take 10 mg by mouth 2 (two) times daily. )  . potassium chloride SA (K-DUR,KLOR-CON) 20 MEQ tablet Take 20 mEq by mouth daily. Pt is only taking "every now and then" (02/24/15)  . Probiotic Product (PROBIOTIC PO) Take 1 tablet by mouth every other day.  . ranitidine (ZANTAC) 150 MG tablet Take 1 tab at bedtime, nightly. (Patient taking differently: Take 150 mg by mouth daily. )  . sucralfate (CARAFATE) 1 g tablet Take 1 tablet (1 g total) by mouth 2 (two) times daily. (Patient taking differently: Take 1 g by mouth 2 (two) times daily as needed (STOMACH). )  . triamterene-hydrochlorothiazide (MAXZIDE-25) 37.5-25 MG tablet Take 1 tablet by mouth daily.   No current facility-administered medications on file prior to visit.    Chief Complaint  Patient presents with  . Follow-up    Pt notes some improvment with dyspnea since last OV. Wears CPAP nightly.     Tests PSG 10/13/14 >> AHI 7 CT chest 11/10/14 >> negative Echo 12/27/14 >> EF 55 to 60% Auto CPAP 04/20/15 to 05/19/15 >> used on 22 of 30 nights with average 5 hrs and 13 min.  Average AHI is 0.9 with median CPAP 9 cm H2O and 95 th percentile CPAP 11 cm H20. PFT 07/29/15 >> FEV1 3.51 (98%), FEV1% 84, TLC 5.10 (72%), DLCO 88%, no BD Auto CPAP 10/29/15 to 11/27/15 >> used on 21 of 30 nights with average 5 hrs 37 min.  Average AHI 0.2 with median CPAP 11 and 95 th percentile CPAP 12 cm H2O  Past medical history HTN, GERD, Bradycardia s/p PM, SVT  Past surgical hx, Allergies, Family hx, Social hx all reviewed.  Vital signs BP 112/78  mmHg  Pulse 80  Ht  (1.803 m)  Wt 322 lb (146.058 kg)  BMI 44.93 kg/m2  SpO2 97%  History of Present Illness: Jim Hill is a 45 y.o. male with OSA.  He has been doing well with CPAP.  He can only get 5.5 to 6 hours sleep per night due to his work schedule.  He doesn't have any issue with his mask fit.  He would like to have follow up on finding of hepatic steatosis seen on CT chest from April 2016.  He had LFTs from February 2017 which were unremarkable.  His lipid panel from October 2016 which showed mildly elevated triglycerides, and low HDL.   Physical Exam:  General - No distress ENT - No sinus tenderness, no oral exudate, no LAN, MP 3 Cardiac - s1s2 regular, no murmur Chest - No wheeze/rales/dullness Back - No focal tenderness Abd - Soft, non-tender Ext - No edema Neuro - Normal strength Skin - No rashes Psych - normal mood, and behavior   Assessment/Plan:  Obstructive sleep apnea. - continue auto CPAP  Insufficient sleep. - advised him to try getting 7 to 8 hours sleep per night as able based on his schedule  Obesity. - discussed techniques to assist with weight loss  Hepatic steatosis noted on CT chest from April 2016. - advised him to  f/u with his PCP and GI - explained role that obesity, lipid management, and hypertension could have with this   Patient Instructions  Follow up in 1 year     Coralyn HellingVineet Carsen Leaf, MD [place] [facility] Pager:  [phone] 11/28/2015, 10:37 AM

## 2015-11-29 NOTE — Telephone Encounter (Signed)
He is coming 5/16 - will discuss then I bleive he is concerned about fatty liver seen on CT LFT's are normal - good sign

## 2015-12-06 ENCOUNTER — Ambulatory Visit: Payer: BLUE CROSS/BLUE SHIELD | Admitting: Internal Medicine

## 2015-12-09 ENCOUNTER — Telehealth: Payer: Self-pay | Admitting: Internal Medicine

## 2015-12-09 ENCOUNTER — Encounter: Payer: Self-pay | Admitting: Internal Medicine

## 2015-12-09 ENCOUNTER — Ambulatory Visit (INDEPENDENT_AMBULATORY_CARE_PROVIDER_SITE_OTHER): Payer: BLUE CROSS/BLUE SHIELD | Admitting: Internal Medicine

## 2015-12-09 VITALS — BP 142/98 | HR 89 | Temp 98.7°F | Wt 313.0 lb

## 2015-12-09 DIAGNOSIS — K2100 Gastro-esophageal reflux disease with esophagitis, without bleeding: Secondary | ICD-10-CM

## 2015-12-09 DIAGNOSIS — R1013 Epigastric pain: Secondary | ICD-10-CM

## 2015-12-09 DIAGNOSIS — K3184 Gastroparesis: Secondary | ICD-10-CM

## 2015-12-09 MED ORDER — DEXLANSOPRAZOLE 60 MG PO CPDR: 60.0000 mg | DELAYED_RELEASE_CAPSULE | Freq: Every day | ORAL | Status: DC

## 2015-12-09 NOTE — Progress Notes (Signed)
Pre visit review using our clinic review tool, if applicable. No additional management support is needed unless otherwise documented below in the visit note. 

## 2015-12-09 NOTE — Telephone Encounter (Signed)
Pt needs to be scheduled for a CT scan and was told to speak to you upon check out but you were at lunch. Please check the AVS because he thinks he may need to be scheduled for something additional. Thank you.  Pt's ph# [phone]

## 2015-12-09 NOTE — Progress Notes (Signed)
Subjective:    Patient ID: Jim BlalockDuane Buehrle, male    DOB: [birth year], 45 y.o.   MRN: [id number]  HPI  Pt presents to the clinic today with c/o ongoing abdominal pain. He describes the pain as burning and crampy. He is nauseated every time he eats, but does not vomit. His bowels are moving normally, he thinks he may have a hemorrhoid because he has noticed BRB when wiping. He has also noticed a bulge in his upper abdomen and thinks it may be a hernia. This has been an ongoing issue for him. He has been being [facility] by [facility]. He was told it was secondary to gastroparesis and they feel like Reglan is not working. They prescribed him an antibiotic (he can not remember the name) but he could not afford it, because it was 800.00. He is taking Nexium, Zantac, Reglan and Carafate.   Review of Systems      Past Medical History  Diagnosis Date  . Hypertension   . Chest pain     normal LHC 2008;  ETT-Echo 8/10: normal  . Obesity   . GERD (gastroesophageal reflux disease)   . Symptomatic bradycardia     [place]acer in [place] in [place]  . Other specified cardiac dysrhythmias(427.89)   . OSA (obstructive sleep apnea)   . SVT (supraventricular tachycardia) (HCC)   . Diastolic heart failure (HCC)   . Sleep apnea   . Barrett's esophagus without dysplasia - subcentimeter changes 06/19/2015  . 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 Gastroparesis 08/19/2015    Current Outpatient Prescriptions  Medication Sig Dispense Refill  . aspirin 81 MG chewable tablet Chew 81 mg by mouth daily.    Marland Kitchen. esomeprazole (NEXIUM) 40 MG capsule Take 1 capsule (40 mg total) by mouth daily. (Patient taking differently: Take 40 mg by mouth at bedtime. ) 30 capsule 2  . metoCLOPramide (REGLAN) 10 MG tablet Take 1 tablet (10 mg total) by mouth 3 (three) times daily before meals. (Patient taking differently: Take 10 mg by mouth 2 (two) times daily. ) 90 tablet 2  . potassium chloride SA (K-DUR,KLOR-CON) 20 MEQ tablet Take 20 mEq by  mouth daily. Pt is only taking "every now and then" (02/24/15)    . Probiotic Product (PROBIOTIC PO) Take 1 tablet by mouth every other day.    . ranitidine (ZANTAC) 150 MG tablet Take 1 tab at bedtime, nightly. (Patient taking differently: Take 150 mg by mouth daily. ) 30 tablet 11  . sucralfate (CARAFATE) 1 g tablet Take 1 tablet (1 g total) by mouth 2 (two) times daily. (Patient taking differently: Take 1 g by mouth 2 (two) times daily as needed (STOMACH). ) 20 tablet 0  . triamterene-hydrochlorothiazide (MAXZIDE-25) 37.5-25 MG tablet Take 1 tablet by mouth daily. 30 tablet 9   No current facility-administered medications for this visit.    Allergies  Allergen Reactions  . Lactose Intolerance (Gi) Nausea And Vomiting  . Pork-Derived Products Nausea And Vomiting and Other (See Comments)    HEADache     Family History  Problem Relation Age of Onset  . Hypertension Other   . Diabetes Other   . Cancer Other   . CAD Other   . Hypertension Maternal Grandmother   . Hypertension Maternal Grandfather   . Hypertension Paternal Grandmother   . Heart disease Paternal Grandfather   . Hypertension Paternal Grandfather   . Pancreatic cancer Paternal Uncle   . Prostate cancer Paternal Grandfather     Social  History   Social History  . Marital Status: Single    Spouse Name: N/A  . Number of Children: N/A  . Years of Education: N/A   Occupational History  . truck driver    Social History Main Topics  . Smoking status: Never Smoker   . Smokeless tobacco: Never Used  . Alcohol Use: 0.0 oz/week    0 Standard drinks or equivalent per week     Comment: occasional  . Drug Use: No  . Sexual Activity: Yes   Other Topics Concern  . Not on file   Social History Narrative   Tobacco history none. He does admit to using recreational drugs in the past. He has not smoked marijuana in 7 years. 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 Both of his parents  are in there 50 s and are  in good health.     Constitutional: Denies fever, malaise, fatigue, headache or abrupt weight changes.  Gastrointestinal: Pt reports abdominal pain and nausea. Denies bloating, constipation, diarrhea or blood in the stool.    No other specific complaints in a complete review of systems (except as listed in HPI above).  Objective:   Physical Exam  BP 142/98 mmHg  Pulse 89  Temp(Src) 98.7 F (37.1 C) (Oral)  Wt 313 lb (141.976 kg)  SpO2 97% Wt Readings from Last 3 Encounters:  12/09/15 313 lb (141.976 kg)  11/28/15 322 lb (146.058 kg)  11/25/15 316 lb 8 oz (143.563 kg)    General: Appears his stated age, obese in NAD. Cardiovascular: Normal rate and rhythm.  Abdomen: Soft and tender in the epigastric region. Normal bowel sounds. No mass noted. Unable to feel liver due to patients size.  BMET    Component Value Date/Time   NA 139 11/20/2015 2146   NA 138 11/10/2014 0106   K 4.0 11/20/2015 2146   K 3.5 11/10/2014 0106   CL 107 11/20/2015 2146   CL 108 11/10/2014 0106   CO2 22 11/20/2015 2146   CO2 26 11/10/2014 0106   GLUCOSE 122* 11/20/2015 2146   GLUCOSE 106* 11/10/2014 0106   BUN 13 11/20/2015 2146   BUN 16 11/10/2014 0106   CREATININE 1.18 11/20/2015 2146   CREATININE 1.24 05/23/2015 1059   CREATININE 1.37* 11/10/2014 0106   CALCIUM 9.0 11/20/2015 2146   CALCIUM 8.7* 11/10/2014 0106   GFRNONAA >60 11/20/2015 2146   GFRNONAA >60 11/10/2014 0106   GFRNONAA 57* 07/29/2014 1506   GFRAA >60 11/20/2015 2146   GFRAA >60 11/10/2014 0106   GFRAA >60 07/29/2014 1506    Lipid Panel     Component Value Date/Time   CHOL 139 05/23/2015 1059   TRIG 152* 05/23/2015 1059   HDL 32* 05/23/2015 1059   CHOLHDL 4.3 05/23/2015 1059   VLDL 30 05/23/2015 1059   LDLCALC 77 05/23/2015 1059    CBC    Component Value Date/Time   WBC 4.9 11/20/2015 2146   WBC 5.7 11/10/2014 0106   RBC 4.69 11/20/2015 2146   RBC 4.61 11/10/2014 0106   HGB 13.1  11/20/2015 2146   HGB 12.7* 11/10/2014 0106   HCT 39.1 11/20/2015 2146   HCT 38.5* 11/10/2014 0106   PLT 211 11/20/2015 2146   PLT 204 11/10/2014 0106   MCV 83.4 11/20/2015 2146   MCV 84 11/10/2014 0106   MCH 27.9 11/20/2015 2146   MCH 27.5 11/10/2014 0106   MCHC 33.5 11/20/2015 2146   MCHC  32.9 11/10/2014 0106   RDW 13.3 11/20/2015 2146   RDW 14.5 11/10/2014 0106   LYMPHSABS 2.5 01/31/2015 0244   MONOABS 0.6 01/31/2015 0244   EOSABS 0.1 01/31/2015 0244   BASOSABS 0.0 01/31/2015 0244    Hgb A1C No results found for: HGBA1C       Assessment & Plan:   Chronic epigastric pain:  Stop Nexium eRx for Dexilant 60 mg daily CT scan of the abdomen for evaluation of chronic abdominal pain Continue to follow with [facility]  Morbid obesity:  Referral placed to nutrition  Will follow up after CT scan, RTC as needed

## 2015-12-09 NOTE — Patient Instructions (Signed)
Heartburn °Heartburn is a type of pain or discomfort that can happen in the throat or chest. It is often described as a burning pain. It may also cause a bad taste in the mouth. Heartburn may feel worse when you lie down or bend over, and it is often worse at night. Heartburn may be caused by stomach contents that move back up into the esophagus (reflux). °HOME CARE INSTRUCTIONS °Take these actions to decrease your discomfort and to help avoid complications. °Diet °· Follow a diet as recommended by your health care provider. This may involve avoiding foods and drinks such as: °¨ Coffee and tea (with or without caffeine). °¨ Drinks that contain alcohol. °¨ Energy drinks and sports drinks. °¨ Carbonated drinks or sodas. °¨ Chocolate and cocoa. °¨ Peppermint and mint flavorings. °¨ Garlic and onions. °¨ Horseradish. °¨ Spicy and acidic foods, including peppers, chili powder, curry powder, vinegar, hot sauces, and barbecue sauce. °¨ Citrus fruit juices and citrus fruits, such as oranges, lemons, and limes. °¨ Tomato-based foods, such as red sauce, chili, salsa, and pizza with red sauce. °¨ Fried and fatty foods, such as donuts, french fries, potato chips, and high-fat dressings. °¨ High-fat meats, such as hot dogs and fatty cuts of red and white meats, such as rib eye steak, sausage, ham, and bacon. °¨ High-fat dairy items, such as whole milk, butter, and cream cheese. °· Eat small, frequent meals instead of large meals. °· Avoid drinking large amounts of liquid with your meals. °· Avoid eating meals during the 2-3 hours before bedtime. °· Avoid lying down right after you eat. °· Do not exercise right after you eat. ° General Instructions  °· Pay attention to any changes in your symptoms. °· Take over-the-counter and prescription medicines only as told by your health care provider. Do not take aspirin, ibuprofen, or other NSAIDs unless your health care provider told you to do so. °· Do not use any tobacco products,  including cigarettes, chewing tobacco, and e-cigarettes. If you need help quitting, ask your health care provider. °· Wear loose-fitting clothing. Do not wear anything tight around your waist that causes pressure on your abdomen. °· Raise (elevate) the head of your bed about 6 inches (15 cm). °· Try to reduce your stress, such as with yoga or meditation. If you need help reducing stress, ask your health care provider. °· If you are overweight, reduce your weight to an amount that is healthy for you. Ask your health care provider for guidance about a safe weight loss goal. °· Keep all follow-up visits as told by your health care provider. This is important. °SEEK MEDICAL CARE IF: °· You have new symptoms. °· You have unexplained weight loss. °· You have difficulty swallowing, or it hurts to swallow. °· You have wheezing or a persistent cough. °· Your symptoms do not improve with treatment. °· You have frequent heartburn for more than two weeks. °SEEK IMMEDIATE MEDICAL CARE IF: °· You have pain in your arms, neck, jaw, teeth, or back. °· You feel sweaty, dizzy, or light-headed. °· You have chest pain or shortness of breath. °· You vomit and your vomit looks like blood or coffee grounds. °· Your stool is bloody or black. °  °This information is not intended to replace advice given to you by your health care provider. Make sure you discuss any questions you have with your health care provider. °  °Document Released: 11/25/2008 Document Revised: 03/30/2015 Document Reviewed: 11/03/2014 °Elsevier Interactive Patient Education ©2016 Elsevier Inc. ° °

## 2015-12-09 NOTE — Telephone Encounter (Signed)
Called patient he is coming in to pick up his prep kit.

## 2015-12-13 ENCOUNTER — Telehealth: Payer: Self-pay

## 2015-12-13 NOTE — Telephone Encounter (Signed)
PA has been submitted through covermymeds--[employer]--awaiting response

## 2015-12-14 ENCOUNTER — Other Ambulatory Visit: Payer: Self-pay | Admitting: Internal Medicine

## 2015-12-14 DIAGNOSIS — R1013 Epigastric pain: Secondary | ICD-10-CM

## 2015-12-14 DIAGNOSIS — G8929 Other chronic pain: Secondary | ICD-10-CM

## 2015-12-14 NOTE — Telephone Encounter (Signed)
Received fax from [employer], which is on pt's insurance card for pharmacy PA, stating that they are NOT the designated party to process request. I [facility] the [facility]

## 2015-12-14 NOTE — Telephone Encounter (Signed)
Resubmitted through covermymeds and [facility] [place]awaiting response

## 2015-12-15 ENCOUNTER — Inpatient Hospital Stay: Admission: RE | Admit: 2015-12-15 | Payer: BLUE CROSS/BLUE SHIELD | Source: Ambulatory Visit

## 2015-12-16 ENCOUNTER — Ambulatory Visit (INDEPENDENT_AMBULATORY_CARE_PROVIDER_SITE_OTHER)
Admission: RE | Admit: 2015-12-16 | Discharge: 2015-12-16 | Disposition: A | Discharge status: Home / Self Care | Payer: BLUE CROSS/BLUE SHIELD | Source: Ambulatory Visit | Attending: Internal Medicine | Admitting: Internal Medicine

## 2015-12-16 DIAGNOSIS — R1013 Epigastric pain: Secondary | ICD-10-CM

## 2015-12-16 MED ORDER — IOPAMIDOL (ISOVUE-300) INJECTION 61%
100.0000 mL | Freq: Once | INTRAVENOUS | Status: AC | PRN
  Administered 2015-12-16: 100 mL via INTRAVENOUS

## 2015-12-16 NOTE — Telephone Encounter (Signed)
[place] from [employer] [place] Dexilant has been approved reference# [id number]

## 2016-01-03 ENCOUNTER — Other Ambulatory Visit: Payer: Self-pay

## 2016-01-03 ENCOUNTER — Ambulatory Visit (INDEPENDENT_AMBULATORY_CARE_PROVIDER_SITE_OTHER): Payer: BLUE CROSS/BLUE SHIELD | Admitting: Internal Medicine

## 2016-01-03 ENCOUNTER — Encounter: Payer: Self-pay | Admitting: Internal Medicine

## 2016-01-03 VITALS — BP 156/84 | HR 85 | Ht 71.0 in | Wt 324.8 lb

## 2016-01-03 DIAGNOSIS — I495 Sick sinus syndrome: Secondary | ICD-10-CM | POA: Diagnosis not present

## 2016-01-03 NOTE — Patient Instructions (Signed)
Medication Instructions:  Your physician recommends that you continue on your current medications as directed. Please refer to the Current Medication list given to you today.   Labwork: None ordered   Testing/Procedures: None ordered   Follow-Up: Your physician wants you to follow-up in: 6 months with Dr TayloCourt Joyr You will receive a reminder letter in the mail two months in advance. If you don't receive a letter, please call our office to schedule the follow-up appointment.   Remote monitoring is used to monitor your Pacemaker  from home. This monitoring reduces the number of office visits required to check your device to one time per year. It allows us to keep an eye on the functioning of your device to ensure it is working properly. You are scheduled for a device check from home on 04/03/16. You may send your transmission at any time that day. If you have a wireless device, the transmission will be sent automatically. After your physician reviews your transmission, you will receive a postcard with your next transmission date.    Any Other Special Instructions Will Be Listed Below (If Applicable).     If you need a refill on your cardiac medications before your next appointment, please call your pharmacy.

## 2016-01-03 NOTE — Progress Notes (Signed)
HPI [name] returns today for follow-up. He is a very pleasant 45 year old man with a history of symptomatic bradycardia (?autonomic dysfunction), status post permanent pacemaker insertion in 2006 in [place]. I suspect he has autonomic dysfunction but this is not well documented. He underwent generator change in 2013. He has gained over 100 pounds in the last 20 years. [place] in [place] but has worked in [place] and now in [place]. He has had 2 catheter ablations in [place] in the past, despite not having any inducible sustained SVT.He has returned to work. He admits to dietary indiscretion.  Allergies  Allergen Reactions  . Lactose Intolerance (Gi) Nausea And Vomiting  . Pork-Derived Products Nausea And Vomiting and Other (See Comments)     headache     Current Outpatient Prescriptions  Medication Sig Dispense Refill  . aspirin 81 MG chewable tablet Chew 81 mg by mouth daily.    Marland Kitchen. dexlansoprazole (DEXILANT) 60 MG capsule Take 1 capsule (60 mg total) by mouth daily. 30 capsule 2  . metoCLOPramide (REGLAN) 10 MG tablet Take 1 tablet (10 mg total) by mouth 3 (three) times daily before meals. 90 tablet 2  . potassium chloride SA (K-DUR,KLOR-CON) 20 MEQ tablet Take 20 mEq by mouth daily as needed (potassium supplement).     . Probiotic Product (PROBIOTIC PO) Take 1 tablet by mouth every other day.    . ranitidine (ZANTAC) 150 MG tablet Take 150 mg by mouth daily.    . sucralfate (CARAFATE) 1 g tablet Take 1 g by mouth 2 (two) times daily as needed (stomach).    . triamterene-hydrochlorothiazide (MAXZIDE-25) 37.5-25 MG tablet Take 1 tablet by mouth daily. 30 tablet 9   No current facility-administered medications for this visit.     Past Medical History  Diagnosis Date  . Hypertension   . Chest pain     normal LHC 2008;  ETT-Echo 8/10: normal  . Obesity   . GERD (gastroesophageal reflux disease)   . Symptomatic bradycardia     [place] pacer in 2006 in  [place]  . Other specified cardiac dysrhythmias(427.89)   . OSA (obstructive sleep apnea)   . SVT (supraventricular tachycardia) (HCC)   . Diastolic heart failure (HCC)   . Sleep apnea   . Barrett's esophagus without dysplasia - subcentimeter changes 06/19/2015  . 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 Gastroparesis 08/19/2015    ROS:   All systems reviewed and negative except as noted in the HPI.   Past Surgical History  Procedure Laterality Date  . Insert / replace / remove pacemaker      status post pacemaker implant August 2006  . Appendectomy  1994  . Permanent pacemaker generator change N/A 04/16/2012    Procedure: PERMANENT PACEMAKER GENERATOR CHANGE;  Surgeon: Marinus MawGregg W Kaizley Aja, MD;  [facility];  Service: Cardiovascular;  Laterality: N/A;  . Colonoscopy    . Upper gastrointestinal endoscopy       Family History  Problem Relation Age of Onset  . Hypertension Other   . Diabetes Other   . Cancer Other   . CAD Other   . Hypertension Maternal Grandmother   . Hypertension Maternal Grandfather   . Hypertension Paternal Grandmother   . Heart disease Paternal Grandfather   . Hypertension Paternal Grandfather   . Pancreatic cancer Paternal Uncle   . Prostate cancer Paternal Grandfather      Social History   Social History  . Marital Status: Single  Spouse Name: N/A  . Number of Children: N/A  . Years of Education: N/A   Occupational History  . truck driver    Social History Main Topics  . Smoking status: Never Smoker   . Smokeless tobacco: Never Used  . Alcohol Use: 0.0 oz/week    0 Standard drinks or equivalent per week     Comment: occasional  . Drug Use: No  . Sexual Activity: Yes   Other Topics Concern  . Not on file   Social History Narrative   Tobacco history none. He does admit to using recreational drugs in the past. He has not smoked marijuana in 7 years. 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  Both of his parents are in there 50 s and are  in good health.     BP 156/84 mmHg  Pulse 85  Ht  (1.803 m)  Wt 324 lb 12.8 oz (147.328 kg)  BMI 45.32 kg/m2  Physical Exam:  Obese appearing 45 year old man, NAD HEENT: Unremarkable Neck:  6 cm JVD, no thyromegally Back:  No CVA tenderness Lungs:  Clear, with no wheezes, rales, or rhonchi. HEART:  Regular rate rhythm, no murmurs, no rubs, no clicks Abd:  soft, positive bowel sounds, no organomegally, no rebound, no guarding Ext:  2 plus pulses, no edema, no cyanosis, no clubbing Skin:  No rashes no nodules Neuro:  CN II through XII intact, motor grossly intact  DEVICE  Normal device function.  See PaceArt for details.   Assess/Plan: 1. 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 Will recheck in several months. 2. HTN - his blood pressure is high today but he has not yet taken his medication. I encouraged him to lose weight and not to miss his meds. 3. Obesity - I discussed dietary changes. He is pending a visit to the dietician.  Leonia Reeves.D.

## 2016-01-05 ENCOUNTER — Ambulatory Visit (INDEPENDENT_AMBULATORY_CARE_PROVIDER_SITE_OTHER): Payer: BLUE CROSS/BLUE SHIELD | Admitting: Neurology

## 2016-01-05 ENCOUNTER — Encounter: Payer: Self-pay | Admitting: Neurology

## 2016-01-05 VITALS — BP 130/90 | HR 82 | Ht 71.0 in | Wt 319.0 lb

## 2016-01-05 DIAGNOSIS — M545 Low back pain, unspecified: Secondary | ICD-10-CM

## 2016-01-05 DIAGNOSIS — R253 Fasciculation: Secondary | ICD-10-CM

## 2016-01-05 MED ORDER — CYCLOBENZAPRINE HCL 5 MG PO TABS: 5.0000 mg | ORAL_TABLET | Freq: Every evening | ORAL | Status: DC | PRN

## 2016-01-05 NOTE — Progress Notes (Signed)
[facility] Neurology Division Clinic Note - Initial Visit   Date: 01/05/2016  Jim Hill MRN: [id number] DOB: February 09, 1971   Dear Nicki Reaper:  Thank you for your kind referral of Jim Hill for consultation of atypical chest pain. Although his history is well known to you, please allow [place] to reiterate it for the purpose of our medical record. The patient was accompanied to the clinic by self.  History of Present Illness: Jim Hill is a 45 y.o. right-handed African American male with morbid obesity, OSA on CPAP, GERD, gastroparesis, bradycardia s/p PPM presenting for evaluation of low back pain and muscle twitches.    Starting around 2015, he began noticing muscle twitches over the left chest, arms, and both legs.  These occur several times per week.  There is no associated pain or weakness.  He has rare cramps of the arms and legs.  He reports that whenever he goes to the ER, the providers are always concerned about cardiac reasons because he may have pain and no one addresses the muscle twitches.  He is concerned that his pacemaker may have caused injury to the nerve which is causing his generalized muscle twitches.  He also complains of low back pain, described as achy and radiates into his legs.  He has associated numbness sometimes.  Pain occurs several times per week, lasting about 30-minutes.  He has tried Aleve, tylenol, or ibuprofen which helps.  Resting or laying flat to stretch his back helps.  He reports having a few falls due to tripping on objects.    He reports drinking nightly for about 7 years, usually greater than 3 drinks per night, but now drinks on the weekends only.  Out-side paper records, electronic medical record, and images have been reviewed where available and summarized as:  Lab Results  Component Value Date   TSH 0.86 04/04/2015    Past Medical History  Diagnosis Date  . Hypertension   . Chest pain     normal LHC 2008;  ETT-Echo  8/10: normal  . Obesity   . GERD (gastroesophageal reflux disease)   . Symptomatic bradycardia     s/p pacer in 2006 in [place]  . Other specified cardiac dysrhythmias(427.89)   . OSA (obstructive sleep apnea)   . SVT (supraventricular tachycardia) (HCC)   . Diastolic heart failure (HCC)   . Sleep apnea   . Barrett's esophagus without dysplasia - subcentimeter changes 06/19/2015  . 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 Gastroparesis 08/19/2015    Past Surgical History  Procedure Laterality Date  . Insert / replace / remove pacemaker      status post pacemaker implant August 2006  . Appendectomy  1994  . Permanent pacemaker generator change N/A 04/16/2012    Procedure: PERMANENT PACEMAKER GENERATOR CHANGE;  Surgeon: Marinus Maw, MD;  Location: [facility] CATH LAB;  Service: Cardiovascular;  Laterality: N/A;  . Colonoscopy    . Upper gastrointestinal endoscopy       Medications:  Outpatient Encounter Prescriptions as of 01/05/2016  Medication Sig  . aspirin 81 MG chewable tablet Chew 81 mg by mouth daily.  Marland Kitchen dexlansoprazole (DEXILANT) 60 MG capsule Take 1 capsule (60 mg total) by mouth daily.  . metoCLOPramide (REGLAN) 10 MG tablet Take 1 tablet (10 mg total) by mouth 3 (three) times daily before meals.  . potassium chloride SA (K-DUR,KLOR-CON) 20 MEQ tablet Take 20 mEq by mouth daily as needed (potassium supplement).   . Probiotic Product (PROBIOTIC PO) Take 1 tablet  by mouth every other day.  . ranitidine (ZANTAC) 150 MG tablet Take 150 mg by mouth daily.  . sucralfate (CARAFATE) 1 g tablet Take 1 g by mouth 2 (two) times daily as needed (stomach).  . triamterene-hydrochlorothiazide (MAXZIDE-25) 37.5-25 MG tablet Take 1 tablet by mouth daily.   No facility-administered encounter medications on file as of 01/05/2016.     Allergies:  Allergies  Allergen Reactions  . Lactose Intolerance (Gi) Nausea And Vomiting  . Pork-Derived Products Nausea And Vomiting and Other (See Comments)       headache    Family History: Family History  Problem Relation Age of Onset  . Hypertension Other   . Diabetes Other   . Cancer Other   . CAD Other   . Hypertension Maternal Grandmother   . Hypertension Maternal Grandfather   . Hypertension Paternal Grandmother   . Heart disease Paternal Grandfather   . Hypertension Paternal Grandfather   . Pancreatic cancer Paternal Uncle   . Prostate cancer Paternal Grandfather     Social History: Social History  Substance Use Topics  . Smoking status: Never Smoker   . Smokeless tobacco: Never Used  . Alcohol Use: 0.0 oz/week    0 Standard drinks or equivalent per week     Comment: occasional   Social History   Social History Narrative   Tobacco history none. He does admit to using recreational drugs in the past. He has not smoked marijuana in 7 years. 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 Both of his parents are in there 50 s and are  in good health.  Lives alone in a one story home.  Has 6 children.  Education: college.    Review of Systems:  CONSTITUTIONAL: No fevers, chills, night sweats, or weight loss.   EYES: No visual changes or eye pain ENT: No hearing changes.  No history of nose bleeds.   RESPIRATORY: No cough, wheezing and shortness of breath.   CARDIOVASCULAR: Negative for chest pain, and palpitations.   GI: Negative for abdominal discomfort, blood in stools or black stools.  No recent change in bowel habits.   GU:  No history of incontinence.   MUSCLOSKELETAL: +No history of joint pain or swelling.  No myalgias.   SKIN: Negative for lesions, rash, and itching.   HEMATOLOGY/ONCOLOGY: Negative for prolonged bleeding, bruising easily, and swollen nodes.  No history of cancer.   ENDOCRINE: Negative for cold or heat intolerance, polydipsia or goiter.   PSYCH:  No depression +anxiety symptoms.   NEURO: As Above.   Vital Signs:  BP 130/90 mmHg  Pulse 82  Ht  (1.803 m)   Wt 319 lb (144.697 kg)  BMI 44.51 kg/m2  SpO2 95%   General Medical Exam:   General:  Well appearing, comfortable.   Eyes/ENT: see cranial nerve examination.   Neck: No masses appreciated.  Full range of motion without tenderness.  No carotid bruits. Respiratory:  Clear to auscultation, good air entry bilaterally.   Cardiac:  Regular rate and rhythm, no murmur.   Extremities:  No deformities, edema, or skin discoloration.  Skin:  No rashes or lesions.  Neurological Exam: MENTAL STATUS including orientation to time, place, person, recent and remote memory, attention span and concentration, language, and fund of knowledge is normal.  Speech is not dysarthric.  CRANIAL NERVES: II:  No visual field defects.  Unremarkable fundi.   III-IV-VI: Pupils equal round and reactive to  light.  Normal conjugate, extra-ocular eye movements in all directions of gaze.  No nystagmus.  No ptosis.   V:  Normal facial sensation.     VII:  Normal facial symmetry and movements.   VIII:  Normal hearing and vestibular function.   IX-X:  Normal palatal movement.   XI:  Normal shoulder shrug and head rotation.   XII:  Normal tongue strength and range of motion, no deviation or fasciculation.  MOTOR:  No atrophy, fasciculations or abnormal movements.  No pronator drift.  Tone is normal.    Right Upper Extremity:    Left Upper Extremity:    Deltoid  5/5   Deltoid  5/5   Biceps  5/5   Biceps  5/5   Triceps  5/5   Triceps  5/5   Wrist extensors  5/5   Wrist extensors  5/5   Wrist flexors  5/5   Wrist flexors  5/5   Finger extensors  5/5   Finger extensors  5/5   Finger flexors  5/5   Finger flexors  5/5   Dorsal interossei  5/5   Dorsal interossei  5/5   Abductor pollicis  5/5   Abductor pollicis  5/5   Tone (Ashworth scale)  0  Tone (Ashworth scale)  0   Right Lower Extremity:    Left Lower Extremity:    Hip flexors  5/5   Hip flexors  5/5   Hip extensors  5/5   Hip extensors  5/5   Knee flexors  5/5    Knee flexors  5/5   Knee extensors  5/5   Knee extensors  5/5   Dorsiflexors  5/5   Dorsiflexors  5/5   Plantarflexors  5/5   Plantarflexors  5/5   Toe extensors  5/5   Toe extensors  5/5   Toe flexors  5/5   Toe flexors  5/5   Tone (Ashworth scale)  0  Tone (Ashworth scale)  0   MSRs:  Right                                                                 Left brachioradialis 1+  brachioradialis 1+  biceps 1+  biceps 1+  triceps 1+  triceps 1+  patellar 1+  patellar 1+  ankle jerk 1+  ankle jerk 1+  Hoffman no  Hoffman no  plantar response down  plantar response down   SENSORY:  Normal and symmetric perception of light touch, pinprick, vibration, and proprioception.  COORDINATION/GAIT: Normal finger-to- nose-finger and heel-to-shin.  Intact rapid alternating movements bilaterally.  Able to rise from a chair without using arms.  Gait is slightly wide-based due to body habitus, stable. Tandem and stressed gait intact.    IMPRESSION: 1.  Low back pain with lumbar radiculopathy 2.  Benign muscle fasciculations  - reassured patient that with normal exam, the likelihood that muscle twitches are due to any worrisome is low  PLAN/RECOMMENDATIONS:  1.  NCS/EMG of the left arm and leg  2.  Start physical therapy for low back stretching 3.  Start flexeril 5mg  at bedtime, do not take when driving.   Return to clinic in 3 months.   The duration of this appointment visit was 45 minutes of face-to-face  time with the patient.  Greater than 50% of this time was spent in counseling, explanation of diagnosis, planning of further management, and coordination of care.   Thank you for allowing me to participate in patient's care.  If I can answer any additional questions, I would be pleased to do so.    Sincerely,    Makaio Mach K. Ala Capri, DO   

## 2016-01-05 NOTE — Patient Instructions (Addendum)
1.  EMG of the left arm and leg 2.  Start physical therapy 3.  Start flexeril 5mg  at bedtime for low back pain.  Do not take when driving.  Return to clinic in 3 months

## 2016-01-20 ENCOUNTER — Ambulatory Visit: Payer: BLUE CROSS/BLUE SHIELD | Admitting: Dietician

## 2016-01-31 ENCOUNTER — Ambulatory Visit (INDEPENDENT_AMBULATORY_CARE_PROVIDER_SITE_OTHER): Payer: BLUE CROSS/BLUE SHIELD | Admitting: Neurology

## 2016-01-31 DIAGNOSIS — M545 Low back pain, unspecified: Secondary | ICD-10-CM

## 2016-01-31 DIAGNOSIS — R253 Fasciculation: Secondary | ICD-10-CM | POA: Diagnosis not present

## 2016-01-31 NOTE — Procedures (Signed)
[facility]  [address], [address]  [place], [place] [address] Tel: [phone] Fax:  [phone] Test Date:  01/31/2016  Patient: Jim Hill DOB: 11-09-1970 Physician: Nita Sickleonika Javonte Elenes, DO  Sex: Male Height: 5\' 10"  Ref Phys: Nita Sickleonika Lateisha Thurlow, DO  ID#: [id number] Temp: 33.2C Technician: Judie PetitM. Dean   Patient Complaints: This is a 45 year old gentleman referred for evaluation of generalized muscle twitches and low back pain.  NCV & EMG Findings: Extensive electrodiagnostic testing of the left upper and lower extremity shows: 1. All sensory responses including the left median, ulnar, sural, and superficial peroneal nerves are within normal limits. 2. All motor responses including the left median, ulnar, tibial, and peroneal nerves are within normal limits. 3. There is no evidence of active or chronic motor axon loss changes affecting any of the tested muscles. No fasciculation potentials were seen in any of the tested muscles.  Impression: This is a normal study.  In particular, there is no evidence of a diffuse myopathy, cervical/lumbosacral radiculopathy, or sensorimotor polyneuropathy affecting the left side.   ___________________________ Nita Sickleonika Debborah Alonge, DO    Nerve Conduction Studies Anti Sensory Summary Table   Stim Site NR Peak (ms) Norm Peak (ms) P-T Amp (V) Norm P-T Amp  Left Median Anti Sensory (2nd Digit)  Wrist    3.1 <3.4 41.4 >20  Left Sup Peroneal Anti Sensory (Ant Lat Mall)  12 cm    2.8 <4.5 16.7 >5  Left Sural Anti Sensory (Lat Mall)  Calf    3.4 <4.5 17.7 >5  Left Ulnar Anti Sensory (5th Digit)  Wrist    2.9 <3.1 14.1 >12   Motor Summary Table   Stim Site NR Onset (ms) Norm Onset (ms) O-P Amp (mV) Norm O-P Amp Site1 Site2 Delta-0 (ms) Dist (cm) Vel (m/s) Norm Vel (m/s)  Left Median Motor (Abd Poll Brev)  Wrist    3.0 <3.9 8.4 >6 Elbow Wrist 4.2 26.0 62 >50  Elbow    7.2  8.2         Left Peroneal Motor (Ext Dig Brev)  Ankle    3.6 <5.5 3.3 >3 B  Fib Ankle 7.3 35.0 48 >40  B Fib    10.9  2.9  Poplt B Fib 1.8 10.0 56 >40  Poplt    12.7  2.9         Left Tibial Motor (Abd Hall Brev)  Ankle    3.4 <6.0 11.3 >8 Knee Ankle 8.9 39.0 44 >40  Knee    12.3  9.1         Left Ulnar Motor (Abd Dig Minimi)  Wrist    2.6 <3.1 8.7 >7 B Elbow Wrist 3.4 20.0 59 >50  B Elbow    6.0  7.9  A Elbow B Elbow 1.5 10.0 67 >50  A Elbow    7.5  7.8          EMG   Side Muscle Ins Act Fibs Psw Fasc Number Recrt Dur Dur. Amp Amp. Poly Poly. Comment  Left 1stDorInt Nml Nml Nml Nml Nml Nml Nml Nml Nml Nml Nml Nml N/A  Left AntTibialis Nml Nml Nml Nml Nml Nml Nml Nml Nml Nml Nml Nml N/A  Left Ext Indicis Nml Nml Nml Nml Nml Nml Nml Nml Nml Nml Nml Nml N/A  Left PronatorTeres Nml Nml Nml Nml Nml Nml Nml Nml Nml Nml Nml Nml N/A  Left Biceps Nml Nml Nml Nml Nml Nml Nml Nml Nml Nml Nml Nml  N/A  Left Triceps Nml Nml Nml Nml Nml Nml Nml Nml Nml Nml Nml Nml N/A  Left Deltoid Nml Nml Nml Nml Nml Nml Nml Nml Nml Nml Nml Nml N/A  Left Gastroc Nml Nml Nml Nml Nml Nml Nml Nml Nml Nml Nml Nml N/A  Left RectFemoris Nml Nml Nml Nml Nml Nml Nml Nml Nml Nml Nml Nml N/A  Left GluteusMed Nml Nml Nml Nml Nml Nml Nml Nml Nml Nml Nml Nml N/A  Left BicepsFemS Nml Nml Nml Nml Nml Nml Nml Nml Nml Nml Nml Nml N/A  Left Cervical Parasp Low Nml Nml Nml Nml Nml Nml Nml Nml Nml Nml Nml Nml N/A      Waveforms:

## 2016-02-03 ENCOUNTER — Encounter: Payer: Self-pay | Admitting: Internal Medicine

## 2016-02-03 ENCOUNTER — Ambulatory Visit (INDEPENDENT_AMBULATORY_CARE_PROVIDER_SITE_OTHER): Payer: BLUE CROSS/BLUE SHIELD | Admitting: Internal Medicine

## 2016-02-03 VITALS — BP 134/84 | HR 76 | Ht 69.29 in | Wt 322.2 lb

## 2016-02-03 DIAGNOSIS — K3184 Gastroparesis: Secondary | ICD-10-CM | POA: Diagnosis not present

## 2016-02-03 DIAGNOSIS — K648 Other hemorrhoids: Secondary | ICD-10-CM | POA: Diagnosis not present

## 2016-02-03 MED ORDER — HYDROCORTISONE 2.5 % RE CREA: 1.0000 "application " | TOPICAL_CREAM | Freq: Two times a day (BID) | RECTAL | Status: DC

## 2016-02-03 NOTE — Progress Notes (Signed)
   Subjective:    Patient ID: Jim BlalockDuane Hill, male    DOB: 1971/01/09, 45 y.o.   MRN: [id number] Cc: f/u gastroparesis HPI [facility] - has been on a regimen of metaclopramide 10 mg tid x 2 weeks then E mycin 250 mg tid z 1 week then no meds x 1 week Is better - occ abdominal cramps only  Has had some rectal bleeding  Medications, allergies, past medical history, past surgical history, family history and social history are reviewed and updated in the EMR.  Review of Systems As above    Objective:   Physical Exam BP 134/84 mmHg  Pulse 76  Ht 5' 9.29" (1.76 m)  Wt 322 lb 4 oz (146.172 kg)  BMI 47.19 kg/m2 NAD Obese Rectal - NL anoderm - increased tone no mass  Anoscopy - Gr 2 internal hemorrhoids all positions      Assessment & Plan:   Encounter Diagnoses  Name Primary?  . Gastroparesis Yes  . Hemorrhoids, internal, with bleeding     Try to reduce metaclopramide/erythromycin medication dosing then frequency 1 month at a time HC cream for hemorrhoids RTC 3 months

## 2016-02-03 NOTE — Patient Instructions (Addendum)
We have sent the following medications to your pharmacy for you to pick up at your convenience: Anusol HC,  You can put the cream over an otc suppositories to insert.   Try to cut back on your reglan and  Erythromycin by half and then after a month skip some doses as discussed.   Follow up with Dr Leone PayorGessner in 3 months.    I appreciate the opportunity to care for you. Stan Headarl Gessner, MD, [facility]

## 2016-02-06 ENCOUNTER — Encounter: Payer: Self-pay | Admitting: Dietician

## 2016-02-06 ENCOUNTER — Encounter: Payer: BLUE CROSS/BLUE SHIELD | Attending: Internal Medicine | Admitting: Dietician

## 2016-02-06 VITALS — Ht 70.0 in | Wt 321.7 lb

## 2016-02-06 DIAGNOSIS — K3184 Gastroparesis: Secondary | ICD-10-CM

## 2016-02-06 NOTE — Progress Notes (Signed)
Medical Nutrition Therapy: Visit start time: 1130  end time: 1230  Assessment:  Diagnosis: obesity, gastroparesis Past medical history: GERD, HTN, sleep apnea Psychosocial issues/ stress concerns: patient reports moderate stress level, states he drinks alcohol (moderately) to deal with stress.  Preferred learning method:  . Auditory . Visual . Hands-on  Current weight: 321. 7lbs  Height: 5'10" Medications, supplements: reviewed list in chart with patient  Progress and evaluation: Patient reports weight fluctuation recently, 305 - 320's lbs.          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  Dietary Intake:  Usual eating pattern includes 2-3 meals and 1-2 snacks per day. Dining out frequency: 10-14 meals per week.  Breakfast: 11-12 (breakfast or lunch) cereal or oatmeal; eggs and [place] bacon when home; when away biscuit with fried chicken, hash browns. Snack: none Lunch: sandwich grilled chicken and fries + juice or soda; sometimes salad when home.  Snack: cookies or candy bar Supper: sandwich from restaurant, sometimes fuffet meal with dessert Snack: 8-9pm ice cream Beverages: water, juice, lemonade, Naked drinks 1-2x a week, soda 1-2x a week.  Nutrition Care Education: Topics covered: gastroparesis, GERD, weight management Basic nutrition: basic food groups, appropriate nutrient balance, appropriate meal and snack schedule    Weight control: benefits of weight control, behavioral changes for weight loss: low fat and low sugar food choices, portion control, guidance for 1900kcal daily intake.  Gastroparesis/ GERD:  Importance of eating small amounts frequently, choosing low fiber foods, limiting high fat foods and spicy and acidic foods. Fluid intake during  meals, chewing food thoroughly.  Other: Benefits of regular exercise; benefits of tracking food intake.   Nutritional Diagnosis:  [place]-1.4 Altered GI function As related to gastroparesis.  As evidenced by MD diagnosis, patient report. [place]-3.3 Overweight/obesity As related to inactivity, excess calories.  As evidenced by patient report.  Intervention: Instruction as noted above.   Set goals with patient input.   Used food models to practice basic meal planning.  Education Materials given:  Marland Kitchen. Gastroparesis Nutrition Therapy (AND) . Food lists with plate method meal planner . Goals/ instructions  Learner/ who was taught:  . Patient   Level of understanding: Marland Kitchen. Verbalizes/ demonstrates competency  Demonstrated degree of understanding via:   Teach back Learning barriers: . None  Willingness to learn/ readiness for change: . Acceptance, ready for change  Monitoring and Evaluation:  Dietary intake, exercise, GI symptoms, and body weight      follow up: 03/09/16

## 2016-02-06 NOTE — Patient Instructions (Signed)
   Plan to eat small amounts of food at a time, and eat often during the day, every 3-4 hours.   Drink mostly water, you can add a sugar free flavor pack if needed.   Try some carnation breakfast drink or other low fat (lactose free) nutrition drink to either replace a meal or as a snack.   Limit spicy foods, hot sauce.   Chew foods thoroughly, especially foods that have some fiber such as popcorn, greens, other vegetables.   Choose mostly cooked vegetables and low sugar canned fruits.   Try tracking food intake using a free phone app such as MyFitnessPal or LoseIt, or another of your choice.   Incorporate some physical activity into your day, even if it's short periods at a time.

## 2016-02-13 ENCOUNTER — Ambulatory Visit: Payer: BLUE CROSS/BLUE SHIELD | Admitting: Acute Care

## 2016-02-13 ENCOUNTER — Encounter: Payer: Self-pay | Admitting: Pulmonary Disease

## 2016-02-13 ENCOUNTER — Ambulatory Visit (INDEPENDENT_AMBULATORY_CARE_PROVIDER_SITE_OTHER): Payer: BLUE CROSS/BLUE SHIELD | Admitting: Pulmonary Disease

## 2016-02-13 VITALS — BP 126/94 | HR 83 | Ht 71.0 in | Wt 322.0 lb

## 2016-02-13 DIAGNOSIS — G4710 Hypersomnia, unspecified: Secondary | ICD-10-CM | POA: Diagnosis not present

## 2016-02-13 DIAGNOSIS — G4733 Obstructive sleep apnea (adult) (pediatric): Secondary | ICD-10-CM

## 2016-02-13 DIAGNOSIS — Z9989 Dependence on other enabling machines and devices: Principal | ICD-10-CM

## 2016-02-13 DIAGNOSIS — F5112 Insufficient sleep syndrome: Secondary | ICD-10-CM | POA: Diagnosis not present

## 2016-02-13 NOTE — Patient Instructions (Signed)
Will change your auto CPAP setting to 5 to 15 cm H2O  Call if daytime sleepiness not improved after few weeks  Follow up in 3 months

## 2016-02-13 NOTE — Progress Notes (Signed)
Current Outpatient Prescriptions on File Prior to Visit  Medication Sig  . aspirin 81 MG chewable tablet Chew 81 mg by mouth daily.  . cyclobenzaprine (FLEXERIL) 5 MG tablet Take 1 tablet (5 mg total) by mouth at bedtime as needed for muscle spasms (Do not take when driving).  Marland Kitchen dexlansoprazole (DEXILANT) 60 MG capsule Take 1 capsule (60 mg total) by mouth daily. (Patient taking differently: Take 30 mg by mouth daily. )  . erythromycin (E-MYCIN) 250 MG tablet Tid ac one week out of the month  . hydrocortisone (ANUSOL-HC) 2.5 % rectal cream Place 1 application rectally 2 (two) times daily. Do x 1 week then as needed  . metoCLOPramide (REGLAN) 10 MG tablet Take 1 tablet (10 mg total) by mouth 3 (three) times daily before meals.  . potassium chloride SA (K-DUR,KLOR-CON) 20 MEQ tablet Take 20 mEq by mouth daily as needed (potassium supplement).   . Probiotic Product (PROBIOTIC PO) Take 1 tablet by mouth every other day.  . ranitidine (ZANTAC) 150 MG tablet Take 150 mg by mouth daily.  . sucralfate (CARAFATE) 1 g tablet Take 1 g by mouth 2 (two) times daily as needed (stomach).  . triamterene-hydrochlorothiazide (MAXZIDE-25) 37.5-25 MG tablet Take 1 tablet by mouth daily.   No current facility-administered medications on file prior to visit.     Chief Complaint  Patient presents with  . Follow-up    Pt states that he is still having issues with CPAP pressure - does not feel adequate. Pt c/o feeling sluggish during the day and sleepy. Advised by [facility] to have pressure increased.    Sleep tests PSG 10/13/14 >> AHI 7 Auto CPAP 01/13/16 to 02/11/16 >> used on 18 of 30 nights with average 5 hrs 46 min.  Average AHI 0.6 with median CPAP 11 and 95 th percentile 12 cm H2O  Pulmonary tests CT chest 11/10/14 >> negative PFT 07/29/15 >> FEV1 3.51 (98%), FEV1% 84, TLC 5.10 (72%), DLCO 88%, no BD  Cardiac tests Echo 12/27/14 >> EF 55 to 60%  Past medical history HTN, GERD, Bradycardia s/p PM,  SVT  Past surgical history, Family history, Social history, Allergies reviewed.  Vital signs BP (!) 126/94 (BP Location: Right Arm, Cuff Size: Large)   Pulse 83   Ht  (1.803 m)   Wt (!) 322 lb (146.1 kg)   SpO2 98%   BMI 44.91 kg/m   History of Present Illness: Jim Hill is a 45 y.o. male with OSA.  He reports using CPAP every night, and getting 7 to 8 hours sleep per night.  His download showed average of 5 hrs 46 min with 60% usage.  He gets sleepy during the day, and this can happen at different times during the day.  He is not sure if he is getting enough pressure from his machine.  He feels that his mask fit is adequate.  Physical Exam:  General - No distress ENT - No sinus tenderness, no oral exudate, no LAN, MP 3 Cardiac - s1s2 regular, no murmur Chest - No wheeze/rales/dullness Back - No focal tenderness Abd - Soft, non-tender Ext - No edema Neuro - Normal strength Skin - No rashes Psych - normal mood, and behavior   Assessment/Plan:  Obstructive sleep apnea with persistent hypersomnia. - will increase his auto CPAP range to 5 to 15 cm H2O - if symptoms persist, then consider in lab titration study - might need to consider adding stimulant medication >> need to ensure he is  getting adequate amount of sleep  Insufficient sleep. - advised him to try getting 7 to 8 hours sleep per night as able based on his schedule  Obesity. - discussed techniques to assist with weight loss   Patient Instructions  Will change your auto CPAP setting to 5 to 15 cm H2O  Call if daytime sleepiness not improved after few weeks  Follow up in 3 months    Coralyn Helling, MD [facility] Pager:  [phone] 02/13/2016, 2:00 PM

## 2016-02-16 ENCOUNTER — Encounter: Payer: BLUE CROSS/BLUE SHIELD | Admitting: Internal Medicine

## 2016-02-21 ENCOUNTER — Telehealth: Payer: Self-pay | Admitting: Pulmonary Disease

## 2016-02-21 NOTE — Telephone Encounter (Signed)
Spoke with the pt  He states he was advised by [facility] we did not send order  However, in our records it shows that we did, AND that it was confirmed they did receive it   Referral Notes  Number of Notes: 3  Type Date User Summary Attachment  General 02/13/2016 5:09 PM Dawne Mellissa Kohut - -  Note   Order confirmed by Synetta Fail @[facility]  Dawne J Law         Type Date User Summary Attachment  General 02/13/2016 2:03 PM Dawne Mellissa Kohut - -  Note   Order sent to Anita/Amanda @[facility]  Lucilla Edin         Type Date User Summary Attachment  Provider Comments 02/13/2016 1:57 PM Coralyn Helling, MD Provider Comments -  Note   Please have his DME change his auto CPAP range to 5 to 15 cm H2O         I called and spoke with Synetta Fail at [facility] and she states will check on this and call us back

## 2016-02-22 NOTE — Telephone Encounter (Signed)
Called and spoke with Jim Hill at [facility] and he stated that they do have the order to adjust the settings of his cpap.  He is aware that this will be taken care of today. Nothing further is needed.

## 2016-03-02 ENCOUNTER — Other Ambulatory Visit: Payer: BLUE CROSS/BLUE SHIELD

## 2016-03-05 ENCOUNTER — Encounter: Payer: Self-pay | Admitting: Internal Medicine

## 2016-03-05 ENCOUNTER — Ambulatory Visit (INDEPENDENT_AMBULATORY_CARE_PROVIDER_SITE_OTHER): Payer: BLUE CROSS/BLUE SHIELD | Admitting: Internal Medicine

## 2016-03-05 VITALS — BP 146/96 | HR 82 | Temp 98.6°F | Ht 70.5 in | Wt 319.5 lb

## 2016-03-05 DIAGNOSIS — K2270 Barrett's esophagus without dysplasia: Secondary | ICD-10-CM

## 2016-03-05 DIAGNOSIS — M7701 Medial epicondylitis, right elbow: Secondary | ICD-10-CM | POA: Insufficient documentation

## 2016-03-05 DIAGNOSIS — G4733 Obstructive sleep apnea (adult) (pediatric): Secondary | ICD-10-CM

## 2016-03-05 DIAGNOSIS — K219 Gastro-esophageal reflux disease without esophagitis: Secondary | ICD-10-CM

## 2016-03-05 DIAGNOSIS — I5032 Chronic diastolic (congestive) heart failure: Secondary | ICD-10-CM

## 2016-03-05 DIAGNOSIS — K3184 Gastroparesis: Secondary | ICD-10-CM

## 2016-03-05 DIAGNOSIS — I10 Essential (primary) hypertension: Secondary | ICD-10-CM

## 2016-03-05 DIAGNOSIS — Z0001 Encounter for general adult medical examination with abnormal findings: Secondary | ICD-10-CM

## 2016-03-05 DIAGNOSIS — R6889 Other general symptoms and signs: Secondary | ICD-10-CM

## 2016-03-05 DIAGNOSIS — Z114 Encounter for screening for human immunodeficiency virus [HIV]: Secondary | ICD-10-CM

## 2016-03-05 DIAGNOSIS — I471 Supraventricular tachycardia: Secondary | ICD-10-CM

## 2016-03-05 NOTE — Assessment & Plan Note (Addendum)
Continue Triamterene HCT Will check CBC, CMET today

## 2016-03-05 NOTE — Progress Notes (Signed)
Subjective:    Patient ID: Jim Hill, male    DOB: Jun 09, 1971, 45 y.o.   MRN: [id number]  HPI  Pt presents to the clinic today for his annual exam. He is also due for follow up of chronic conditions.   HTN: BP well controlled on Triamterene HCT. His BP today is 146/96.  He reports he did not take the medication yet today. ECG from 11/2015 reviewed.  Diastolic CHF: NYHS class 1. He is taking Triamterene HCT as prescribed. He tries to monitor his weight daily. He follows with Dr. Ladona Ridgel.  SVT: Status post medtronic pacemaker placement.  Follows with Dr. Ladona Ridgel.  GERD with Barretts Esophagus: He had an upper GI 05/2015. He is taking Dexilant and Zantac as prescribed. He is following with Dr. Leone Payor.  He reports occasional breakthrough symptoms.    Gastroparesis: He is taking a week off of the Reglan and Erythromycin as directed by Dr. Leone Payor.  He will restart those medications later this week.  Obesity: His BMI is 45.20.  He eats fish and chicken, potatoes, and some fruits and vegetables.  He does not red meats or fried foods.  He does not exercise.  OSA: He had a sleep study 09/2014. He is wearing his CPAP machine.  He reports occassional trouble staying asleep and admits to occasional daytime tiredness.  He complains of right medial elbow pain x 2 months.  He describes the pain as constant throbbing and aching. The pain worsens with movement.  He has tried [place] with relief but symptoms return.  Flu: 03/2015 Tetanus: 09/2011 Vision Screening: Yearly but past due for appointment Dentist: Annually  Diet:  He eats fish and chicken, potatoes, and some fruits and vegetables.  He does not red meats or fried foods.  He drinks water and cranberry juice throughout the day. Exercise: None    Review of Systems      Past Medical History:  Diagnosis Date  . Barrett's esophagus without dysplasia - subcentimeter changes 06/19/2015  . Chest pain    normal LHC 2008;  ETT-Echo 8/10:  normal  . 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 Diastolic heart failure (HCC)   . Gastroparesis 08/19/2015  . GERD (gastroesophageal reflux disease)   . Hypertension   . Obesity   . OSA (obstructive sleep apnea)   . Other specified cardiac dysrhythmias(427.89)   . Sleep apnea   . SVT (supraventricular tachycardia) (HCC)   . Symptomatic bradycardia    s/p pacer in 2006 in [place]    Current Outpatient Prescriptions  Medication Sig Dispense Refill  . aspirin 81 MG chewable tablet Chew 81 mg by mouth daily.    . cyclobenzaprine (FLEXERIL) 5 MG tablet Take 1 tablet (5 mg total) by mouth at bedtime as needed for muscle spasms (Do not take when driving). 30 tablet 3  . dexlansoprazole (DEXILANT) 60 MG capsule Take 1 capsule (60 mg total) by mouth daily. (Patient taking differently: Take 30 mg by mouth daily. ) 30 capsule 2  . erythromycin (E-MYCIN) 250 MG tablet Tid ac one week out of the month    . hydrocortisone (ANUSOL-HC) 2.5 % rectal cream Place 1 application rectally 2 (two) times daily. Do x 1 week then as needed 30 g 1  . metoCLOPramide (REGLAN) 10 MG tablet Take 1 tablet (10 mg total) by mouth 3 (three) times daily before meals. 90 tablet 2  . potassium chloride SA (K-DUR,KLOR-CON) 20 MEQ tablet Take 20 mEq by mouth daily as needed (potassium supplement).     Marland Kitchen  Probiotic Product (PROBIOTIC PO) Take 1 tablet by mouth every other day.    . ranitidine (ZANTAC) 150 MG tablet Take 150 mg by mouth daily.    . sucralfate (CARAFATE) 1 g tablet Take 1 g by mouth 2 (two) times daily as needed (stomach).    . triamterene-hydrochlorothiazide (MAXZIDE-25) 37.5-25 MG tablet Take 1 tablet by mouth daily. 30 tablet 9   No current facility-administered medications for this visit.     Allergies  Allergen Reactions  . Lactose Intolerance (Gi) Nausea And Vomiting  . Pork-Derived Products Nausea And Vomiting and Other (See Comments)     headache    Family History  Problem Relation Age of Onset   . Hypertension Other   . Diabetes Other   . Cancer Other   . CAD Other   . Hypertension Maternal Grandmother   . Hypertension Maternal Grandfather   . Hypertension Paternal Grandmother   . Heart disease Paternal Grandfather   . Hypertension Paternal Grandfather   . Pancreatic cancer Paternal Uncle   . Prostate cancer Paternal Grandfather   . Healthy Father   . Healthy Mother     Social History   Social History  . Marital status: Single    Spouse name: N/A  . Number of children: N/A  . Years of education: N/A   Occupational History  . truck driver    Social History Main Topics  . Smoking status: Never Smoker  . Smokeless tobacco: Never Used  . Alcohol use 0.0 oz/week     Comment: occasional on the weekends (2-3 drinks, liquor and beer)  . Drug use: No  . Sexual activity: Yes   Other Topics Concern  . Not on file   Social History Narrative   Tobacco history none. He does admit to using recreational drugs in the past. He has not smoked marijuana in 7 years.    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 Both of his parents are in there 50 s and are  in good health.  Lives alone in a one story home.  Has 6 children.     Education: college.     Constitutional: Denies fever, malaise, fatigue, headache or abrupt weight changes.  HEENT: Pt reports blurred vision. Denies eye pain, eye redness, ear pain, ringing in the ears, wax buildup, runny nose, nasal congestion, bloody nose, or sore throat. Respiratory: Denies difficulty breathing, shortness of breath, cough or sputum production.   Cardiovascular: Denies chest pain, chest tightness, palpitations or swelling in the hands or feet.  Gastrointestinal: Denies abdominal pain, bloating, constipation, diarrhea or blood in the stool.  GU: Denies urgency, frequency, pain with urination, burning sensation, blood in urine, odor or discharge. Musculoskeletal: Pt reports right elbow pain.  Denies  decrease in range of motion, difficulty with gait, and swelling.  Skin: Denies redness, rashes, lesions or ulcercations.  Neurological: Denies dizziness, difficulty with memory, difficulty with speech or problems with balance and coordination.  Psych: Denies anxiety, depression, SI/HI.  No other specific complaints in a complete review of systems (except as listed in HPI above).  Objective:   Physical Exam BP (!) 146/96   Pulse 82   Temp 98.6 F (37 C) (Oral)   Ht 5' 10.5" (1.791 m)   Wt (!) 319 lb 8 oz (144.9 kg)   SpO2 97%   BMI 45.20 kg/m   General: Well-appearing, obese, in no acute distress. Skin: Dry and intact. HEENT: No scleral  icterus or conjunctival injection.  TMs pearly grey, landmarks visible.  No erythema or discharge of nose.  No exudates or erythema of pharynx. Pulm: Clear to auscultation bilaterally. No wheezes, rales, or rhonchi. CV: Regular rate and rhythm.  No murmurs, rubs, or gallops.  No carotid bruits noted. GI: Positive bowel sounds all four quadrants.  Tympanic on percussion throughout.  Soft, tender to palpation in epigastric region.   MSK: Right medial elbow tender to palpation.  No swelling noted.  Full AROM bilateral elbows, pain in right elbow with all motions.  Strength 5/5 BUE and BLE. Neuro: Alert and oriented Psych: Mood and affect normal.     Assessment & Plan:   Preventative Health Maintainence:  Encouraged regular exercise and healthy diet Schedule appointment for eye exam Continue to see dentist annually Tetanus up to date Encouraged flu immunization in the fall Will check CBC, CMET, Lipid, A1C and HIV today  BAITY, REGINA, NP

## 2016-03-05 NOTE — Assessment & Plan Note (Signed)
Try ice, tennis elbow brace Call if symptoms worsen or do not resolve

## 2016-03-05 NOTE — Assessment & Plan Note (Signed)
Continue to follow with Dr. Ladona Ridgelaylor

## 2016-03-05 NOTE — Assessment & Plan Note (Signed)
Encouraged healthy diet and regular exercise   

## 2016-03-05 NOTE — Assessment & Plan Note (Signed)
Continue Dexilant and Zantac

## 2016-03-05 NOTE — Assessment & Plan Note (Addendum)
Continue to wear CPAP machine Encouraged weight loss

## 2016-03-05 NOTE — Assessment & Plan Note (Addendum)
Euvolemic on exam Continue Triamterene-HCT Daily weights Continue to follow with Dr. Ladona Ridgelaylor

## 2016-03-05 NOTE — Assessment & Plan Note (Addendum)
Continue Dexilant and Zantac Encouraged weight loss

## 2016-03-05 NOTE — Patient Instructions (Signed)

## 2016-03-05 NOTE — Assessment & Plan Note (Signed)
Restart Reglan and Erythromycin later this week as directed by Dr. Leone PayorGessner

## 2016-03-06 ENCOUNTER — Encounter: Payer: Self-pay | Admitting: Emergency Medicine

## 2016-03-06 ENCOUNTER — Emergency Department
Admission: EM | Admit: 2016-03-06 | Discharge: 2016-03-06 | Disposition: A | Discharge status: Home / Self Care | Payer: BLUE CROSS/BLUE SHIELD | Attending: Emergency Medicine | Admitting: Emergency Medicine

## 2016-03-06 ENCOUNTER — Emergency Department: Payer: BLUE CROSS/BLUE SHIELD

## 2016-03-06 DIAGNOSIS — R002 Palpitations: Secondary | ICD-10-CM

## 2016-03-06 DIAGNOSIS — R000 Tachycardia, unspecified: Secondary | ICD-10-CM

## 2016-03-06 DIAGNOSIS — I5030 Unspecified diastolic (congestive) heart failure: Secondary | ICD-10-CM | POA: Insufficient documentation

## 2016-03-06 DIAGNOSIS — I110 Hypertensive heart disease with heart failure: Secondary | ICD-10-CM | POA: Insufficient documentation

## 2016-03-06 LAB — COMPREHENSIVE METABOLIC PANEL
ALT: 37 U/L (ref 0–53)
AST: 31 U/L (ref 0–37)
Albumin: 4.5 g/dL (ref 3.5–5.2)
Alkaline Phosphatase: 69 U/L (ref 39–117)
BUN: 14 mg/dL (ref 6–23)
CHLORIDE: 107 meq/L (ref 96–112)
CO2: 26 meq/L (ref 19–32)
Calcium: 9.6 mg/dL (ref 8.4–10.5)
Creatinine, Ser: 1.31 mg/dL (ref 0.40–1.50)
GFR: 76.24 mL/min (ref >60.00)
GLUCOSE: 84 mg/dL (ref 70–99)
POTASSIUM: 4.1 meq/L (ref 3.5–5.1)
SODIUM: 140 meq/L (ref 135–145)
Total Bilirubin: 0.6 mg/dL (ref 0.2–1.2)
Total Protein: 7.8 g/dL (ref 6.0–8.3)

## 2016-03-06 LAB — CARBOXYHEMOGLOBIN
CARBOXYHEMOGLOBIN: 1.2 % — AB (ref 1.5–9.0)
METHEMOGLOBIN: 1.8 %
O2 SAT: 95.2 %
TOTAL OXYGEN CONTENT: 92.3 mL/dL

## 2016-03-06 LAB — BASIC METABOLIC PANEL
ANION GAP: 7 (ref 5–15)
BUN: 16 mg/dL (ref 6–20)
CALCIUM: 8.7 mg/dL — AB (ref 8.9–10.3)
CO2: 25 mmol/L (ref 22–32)
Chloride: 106 mmol/L (ref 101–111)
Creatinine, Ser: 1.5 mg/dL — ABNORMAL HIGH (ref 0.61–1.24)
GFR, EST NON AFRICAN AMERICAN: 55 mL/min — AB (ref >60)
Glucose, Bld: 108 mg/dL — ABNORMAL HIGH (ref 65–99)
Potassium: 3.8 mmol/L (ref 3.5–5.1)
SODIUM: 138 mmol/L (ref 135–145)

## 2016-03-06 LAB — CBC
HCT: 38.5 % — ABNORMAL LOW (ref 40.0–52.0)
HEMATOCRIT: 40.1 % (ref 39.0–52.0)
HEMOGLOBIN: 13.4 g/dL (ref 13.0–18.0)
Hemoglobin: 13.4 g/dL (ref 13.0–17.0)
MCH: 28.5 pg (ref 26.0–34.0)
MCHC: 33.5 g/dL (ref 30.0–36.0)
MCHC: 34.7 g/dL (ref 32.0–36.0)
MCV: 82.8 fl (ref 78.0–100.0)
MCV: 82.1 fL (ref 80.0–100.0)
Platelets: 208 10*3/uL (ref 150.0–400.0)
Platelets: 177 10*3/uL (ref 150–440)
RBC: 4.84 Mil/uL (ref 4.22–5.81)
RBC: 4.69 MIL/uL (ref 4.40–5.90)
RDW: 14.6 % (ref 11.5–15.5)
RDW: 14.2 % (ref 11.5–14.5)
WBC: 5.3 10*3/uL (ref 4.0–10.5)
WBC: 5.8 10*3/uL (ref 3.8–10.6)

## 2016-03-06 LAB — LIPID PANEL
CHOL/HDL RATIO: 5
Cholesterol: 181 mg/dL (ref 0–200)
HDL: 36.7 mg/dL — AB (ref >39.00)
NonHDL: 144.75
Triglycerides: 230 mg/dL — ABNORMAL HIGH (ref 0.0–149.0)
VLDL: 46 mg/dL — AB (ref 0.0–40.0)

## 2016-03-06 LAB — TSH: TSH: 0.993 u[IU]/mL (ref 0.350–4.500)

## 2016-03-06 LAB — LDL CHOLESTEROL, DIRECT: Direct LDL: 65 mg/dL

## 2016-03-06 LAB — MAGNESIUM: Magnesium: 2.1 mg/dL (ref 1.7–2.4)

## 2016-03-06 LAB — TROPONIN I: Troponin I: <0.03 ng/mL (ref <0.03)

## 2016-03-06 LAB — HEMOGLOBIN A1C: HEMOGLOBIN A1C: 5.9 % (ref 4.6–6.5)

## 2016-03-06 MED ORDER — METOPROLOL TARTRATE 25 MG PO TABS
ORAL_TABLET | ORAL | Status: AC
  Filled 2016-03-06: qty 1

## 2016-03-06 MED ORDER — METOPROLOL TARTRATE 25 MG PO TABS
25.0000 mg | ORAL_TABLET | Freq: Once | ORAL | Status: AC
  Administered 2016-03-06: 25 mg via ORAL
  Filled 2016-03-06: qty 1

## 2016-03-06 NOTE — ED Provider Notes (Signed)
[facility] Emergency Department Provider Note   ____________________________________________   First MD Initiated Contact with Patient 03/06/16 [phone]     (approximate)  I have reviewed the triage vital signs and the nursing notes.   HISTORY  Chief Complaint Palpitations    HPI Len BlalockDuane Consalvo is a 45 y.o. male who came into the hospital today with palpitations. 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 He reports that he had his CPAP machine on but the car was also on and he is unsure if he inhaled too much exhaust. The patient reports that he has a pacemaker but he has not had an episode with palpitations like this in the long time. He reports that he initially felt short of breath but that is resolved. The patient denies any chest pain with the palpitations. He had his pacemaker battery changed in 2013. The patient denies any nausea, vomiting, sweats. He had some mild dizziness and headache but again that is resolved. The patient reports that his heart rate felt irregular and fast. He reports he had some beers tonight but has not been drinking any caffeine. He is here for evaluation.   Past Medical History:  Diagnosis Date  . Barrett's esophagus without dysplasia - subcentimeter changes 06/19/2015  . Chest pain    normal LHC 2008;  ETT-Echo 8/10: normal  . 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 Diastolic heart failure (HCC)   . Gastroparesis 08/19/2015  . GERD (gastroesophageal reflux disease)   . Hypertension   . Obesity   . OSA (obstructive sleep apnea)   . Other specified cardiac dysrhythmias(427.89)   . Sleep apnea   . SVT (supraventricular tachycardia) (HCC)   . Symptomatic bradycardia    [place] in 2006 in [place]    Patient Active Problem List   Diagnosis Date Noted  . Medial epicondylitis of right elbow 03/05/2016  . Hemorrhoids, internal, with bleeding 02/03/2016  . Gastroparesis  08/19/2015  . Barrett's esophagus without dysplasia - subcentimeter changes 06/19/2015  . Severe obesity (BMI >= 40) (HCC) 04/10/2015  . SVT (supraventricular tachycardia) (HCC) 12/02/2014  . OSA (obstructive sleep apnea) 09/01/2014  . Chronic diastolic heart failure (HCC) 07/06/2014  . GERD (gastroesophageal reflux disease) 07/09/2011  . Pacemaker-Medtronic 03/30/2011  . Hypertension 03/30/2011    Past Surgical History:  Procedure Laterality Date  . APPENDECTOMY  1994  . COLONOSCOPY    . INSERT / REPLACE / REMOVE PACEMAKER     status post pacemaker implant August 2006  . PERMANENT PACEMAKER GENERATOR CHANGE N/A 04/16/2012   Procedure: PERMANENT PACEMAKER GENERATOR CHANGE;  Surgeon: Marinus MawGregg W Taylor, MD;  [facility];  Service: Cardiovascular;  Laterality: N/A;  . UPPER GASTROINTESTINAL ENDOSCOPY      Prior to Admission medications   Medication Sig Start Date End Date Taking? Authorizing Provider  aspirin 81 MG chewable tablet Chew 81 mg by mouth daily.    Historical Provider, MD  cyclobenzaprine (FLEXERIL) 5 MG tablet Take 1 tablet (5 mg total) by mouth at bedtime as needed for muscle spasms (Do not take when driving). 01/05/16   Donika Concha SeK Patel, DO  dexlansoprazole (DEXILANT) 60 MG capsule Take 1 capsule (60 mg total) by mouth daily. Patient taking differently: Take 30 mg by mouth daily.  12/09/15   Lorre Munroeegina W Baity, NP  erythromycin (E-MYCIN) 250 MG tablet Tid ac one week out of the month 02/03/16   Iva Booparl E Gessner, MD  hydrocortisone (ANUSOL-HC) 2.5 % rectal cream Place 1 application rectally 2 (two) times daily. Do x 1 week then as needed 02/03/16   Iva Boop, MD  metoCLOPramide (REGLAN) 10 MG tablet Take 1 tablet (10 mg total) by mouth 3 (three) times daily before meals. 08/19/15   Iva Boop, MD  potassium chloride SA (K-DUR,KLOR-CON) 20 MEQ tablet Take 20 mEq by mouth daily as needed (potassium supplement).     Historical Provider, MD  Probiotic Product (PROBIOTIC PO)  Take 1 tablet by mouth every other day.    Historical Provider, MD  ranitidine (ZANTAC) 150 MG tablet Take 150 mg by mouth daily.    Historical Provider, MD  sucralfate (CARAFATE) 1 g tablet Take 1 g by mouth 2 (two) times daily as needed (stomach).    Historical Provider, MD  triamterene-hydrochlorothiazide (MAXZIDE-25) 37.5-25 MG tablet Take 1 tablet by mouth daily. 07/01/15   Marinus Maw, MD    Allergies Lactose intolerance (gi) and Pork-derived products  Family History  Problem Relation Age of Onset  . Hypertension Maternal Grandmother   . Hypertension Maternal Grandfather   . Hypertension Paternal Grandmother   . Heart disease Paternal Grandfather   . Hypertension Paternal Grandfather   . Prostate cancer Paternal Grandfather   . Healthy Mother   . Healthy Father   . Hypertension Other   . Diabetes Other   . Cancer Other   . CAD Other   . Pancreatic cancer Paternal Uncle     Social History Social History  Substance Use Topics  . Smoking status: Never Smoker  . Smokeless tobacco: Never Used  . Alcohol use 0.0 oz/week     Comment: occasional on the weekends (2-3 drinks, liquor and beer)    Review of Systems Constitutional: No fever/chills Eyes: No visual changes. ENT: No sore throat. Cardiovascular: palpitations Denies chest pain. Respiratory:  shortness of breath. Gastrointestinal: No abdominal pain.  No nausea, no vomiting.  No diarrhea.  No constipation. Genitourinary: Negative for dysuria. Musculoskeletal: Negative for back pain. Skin: Negative for rash. Neurological: dizziness and headache  10-point ROS otherwise negative.  ____________________________________________   PHYSICAL EXAM:  VITAL SIGNS: ED Triage Vitals  Enc Vitals Group     BP 03/06/16 0312 138/82     Pulse Rate 03/06/16 0312 91     Resp 03/06/16 0312 18     Temp 03/06/16 0312 97.8 F (36.6 C)     Temp Source 03/06/16 0312 Oral     SpO2 03/06/16 0312 96 %     Weight 03/06/16 0312  (!) 310 lb (140.6 kg)     Height 03/06/16 0312 5\' 11"  (1.803 m)     Head Circumference --      Peak Flow --      Pain Score 03/06/16 0313 3     Pain Loc --      Pain Edu? --      Excl. in GC? --     Constitutional: Alert and oriented. Well appearing and in no acute distress. Eyes: Conjunctivae are normal. PERRL. EOMI. Head: Atraumatic. Nose: No congestion/rhinnorhea. Mouth/Throat: Mucous membranes are moist.  Oropharynx non-erythematous. Cardiovascular: Normal rate, regular rhythm. Grossly normal heart sounds.  Good peripheral circulation. Respiratory: Normal respiratory effort.  No retractions. Lungs CTAB. Gastrointestinal: Soft and nontender. No distention. Positive bowel sounds. Musculoskeletal: No lower extremity tenderness nor edema.  . Neurologic:  Normal speech and language.  Skin:  Skin is warm, dry and intact. Marland Kitchen Psychiatric: Mood and affect are  normal.   ____________________________________________   LABS (all labs ordered are listed, but only abnormal results are displayed)  Labs Reviewed  BASIC METABOLIC PANEL - Abnormal; Notable for the following:       Result Value   Glucose, Bld 108 (*)    Creatinine, Ser 1.50 (*)    Calcium 8.7 (*)    GFR calc non Af Amer 55 (*)    All other components within normal limits  CBC - Abnormal; Notable for the following:    HCT 38.5 (*)    All other components within normal limits  CARBOXYHEMOGLOBIN - Abnormal; Notable for the following:    Carboxyhemoglobin 1.2 (*)    All other components within normal limits  TROPONIN I  MAGNESIUM  TSH  TROPONIN I   ____________________________________________  EKG  ED ECG REPORT I, Rebecka ApleyWebster,  Jleigh Striplin P, the attending physician, personally viewed and interpreted this ECG.   Date: 03/06/2016  EKG Time: 313  Rate: 87  Rhythm: normal sinus rhythm  Axis: normal  Intervals:none  ST&T Change: normal  ____________________________________________  RADIOLOGY  CXR: No active  cardiopulmonary disease ____________________________________________   PROCEDURES  Procedure(s) performed: None  Procedures  Critical Care performed: No  ____________________________________________   INITIAL IMPRESSION / ASSESSMENT AND PLAN / ED COURSE  Pertinent labs & imaging results that were available during my care of the patient were reviewed by me and considered in my medical decision making (see chart for details).  This is a 45 year old male who comes into the hospital today with palpitations. The patient reports that his symptoms resolved but he is concerned about possible carbon monoxide poisoning. The patient's initial blood work was unremarkable including his carboxyhemoglobin but we did have Medtr[employer] interrogate the patient's pacemaker to determine what rhythm the patient had during this episode of palpitations.  Clinical Course   The pacemaker was interrogated and it appears that the patient had a 1 minute 12 second episode of tachycardia at approximately 231. There is a 1-1 conduction of atrial and ventricular beats and the rate was in the 170s. It appears that the patient had some sinus tachycardia. It resolved without any intervention. I feel that the patient needs to follow back up with his primary cardiologist. He is not on a rate controlling medication at this time. I am awaiting the results of his magnesium, TSH and repeat troponin. The patient's care will be signed over to Dr. Shaune PollackLord who will follow-up the results of the blood work and disposition the patient.  ____________________________________________   FINAL CLINICAL IMPRESSION(S) / ED DIAGNOSES  Final diagnoses:  Palpitations  Tachycardia      NEW MEDICATIONS STARTED DURING THIS VISIT:  New Prescriptions   No medications on file     Note:  This document was prepared using Dragon voice recognition software and may include unintentional dictation errors.    Rebecka ApleyAllison P Aneisha Skyles, MD 03/06/16  [phone]

## 2016-03-06 NOTE — Discharge Instructions (Signed)
You were evaluated for fast heart rate and found to have either sinus tachycardia or SVT.   In discussion with on-call cardiologist, you are going to be started back on metoprolol, and you need to follow up with your cardiologist Dr. Ladona Ridgelaylor next available appointment, within this week.  Return to the emergency department for any worsening condition including chest pain, trouble breathing, palpitations, fast heart rate, fever, or any other symptoms concerning to you.

## 2016-03-06 NOTE — ED Notes (Signed)
Report from [employer]: Ventricular pacing programmed only, does not use the pacemaker at all. Episode @ 0231 for a minutes and 12 seconds w/ a ventricular rate of 173 on average. Review to be sent via fax from [employer]

## 2016-03-06 NOTE — ED Provider Notes (Signed)
[facility]  I accepted care from Dr. Zenda AlpersWebster ____________________________________________    LABS (pertinent positives/negatives)  Labs Reviewed  BASIC METABOLIC PANEL - Abnormal; Notable for the following:       Result Value   Glucose, Bld 108 (*)    Creatinine, Ser 1.50 (*)    Calcium 8.7 (*)    GFR calc non Af Amer 55 (*)    All other components within normal limits  CBC - Abnormal; Notable for the following:    HCT 38.5 (*)    All other components within normal limits  CARBOXYHEMOGLOBIN - Abnormal; Notable for the following:    Carboxyhemoglobin 1.2 (*)    All other components within normal limits  TROPONIN I  MAGNESIUM  TSH  TROPONIN I     ____________________________________________   INITIAL IMPRESSION / ASSESSMENT AND PLAN / ED COURSE   Pertinent labs & imaging results that were available during my care of the patient were reviewed by me and considered in my medical decision making (see chart for details).  I accepted care from Dr. Zenda AlpersWebster who reviewed the Medtronic pacer interrogation report showing ventricular rate to 170s this morning for about 1 minute with associated atrial markers in likely pattern of 1:1 conduction, but recommended cardiologist review on the report.  Patient has had no additional episodes.  Repeat troponin negative.  Magnesium and TSH reassuring.  I spoke with the patient about starting on metoprolol for possible SVT given the rate at the 170s.  The patient stated that he was previously on metoprolol, he has the tablets at home, he had been taken off of it after 2 ablations for SVT.  CONSULTATIONS: On call cardiologist for [facility] (Patient's cardiologist is Dr. Lewayne BuntingGregg Taylor), Dr. Mariah MillingGollan who recommends metoprolol tartrate 25 mg twice a day and follow up closely with Dr. Ladona Ridgelaylor.      Patient / Family / Caregiver informed of clinical course, medical decision-making process, and agree with plan.   I discussed  return precautions, follow-up instructions, and discharged instructions with patient and/or family.     ____________________________________________   FINAL CLINICAL IMPRESSION(S) / ED DIAGNOSES  Final diagnoses:  Palpitations  Tachycardia        Governor Rooksebecca Clayton Bosserman, MD 03/06/16 [phone]

## 2016-03-06 NOTE — ED Triage Notes (Addendum)
Pt ambulatory to triage with steady gait. Pt reports awoke from sleep with palpitations, states has pacemaker. Pt states "I don't know if its my pacemaker or carbon monoxide poisoning from my truck." Pt c/o left shoulder pain and headache. Pt states truck was turned on while he was sleeping. Reports felt short of breath during episode but denies any at this time.  Pt denies chest pain or abdominal pain. Pt alert and oriented x 4, respirations even and unlabored.

## 2016-03-06 NOTE — ED Notes (Signed)
Pt discharged home after verbalizing understanding of discharge instructions; nad noted. 

## 2016-03-08 ENCOUNTER — Encounter: Payer: Self-pay | Admitting: Cardiology

## 2016-03-08 ENCOUNTER — Ambulatory Visit (INDEPENDENT_AMBULATORY_CARE_PROVIDER_SITE_OTHER): Payer: Self-pay | Admitting: Cardiology

## 2016-03-08 VITALS — BP 124/86 | HR 84 | Ht 71.0 in | Wt 320.8 lb

## 2016-03-08 DIAGNOSIS — I471 Supraventricular tachycardia: Secondary | ICD-10-CM

## 2016-03-08 MED ORDER — METOPROLOL TARTRATE 25 MG PO TABS
25.0000 mg | ORAL_TABLET | Freq: Two times a day (BID) | ORAL | 3 refills | Status: DC
Start: 2016-03-08 — End: 2016-04-30

## 2016-03-08 NOTE — Patient Instructions (Signed)
Medication Instructions:   START TAKING METOPROLOL 25 MG TWICE A DAY     If you need a refill on your cardiac medications before your next appointment, please call your pharmacy.  Labwork: NONE ORDER TODAY    Testing/Procedures: NONE ORDER TODAY    Follow-Up:  AS SCHEDULED WITH DR Ladona RidgelAYLOR  ( REMINDER LETTER WILL COME TO HOUSE TO REMIND YOU WHEN TO CONTACT OFFICE TO MAKE APPOINTMENT)   Any Other Special Instructions Will Be Listed Below (If Applicable).

## 2016-03-08 NOTE — Progress Notes (Signed)
03/08/2016 Jim Hill   Aug 06, 1970  [id number]  Primary Physician Nicki ReaperBAITY, REGINA, NP Primary Cardiologist/Electrophysiologist: Dr. Ladona Ridgelaylor  Reason for Visit/CC: f/u for SVT  HPI:  Jim Hill is a 45 year old male, followed by Dr. 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. He underwent generator change in 2013. He has had 2 catheter ablations in [place] in the past, despite not having any inducible sustained SVT.  He presents to clinic today for follow-up after recently being seen in the emergency department for SVT on 03/06/16. This occurred while sleeping and woke him up suddenly. His device was interrogated in the ED. Per records, report showed ventricular rate to the 170s for roughly 1 min with associated atrial markers in likely pattern of 1:1 conduction. Patient had no additional episodes. Troponin was negative x 2.  K, Mg and TSH WNL. It was recommended he start metoprolol 25 mg BID. He now presents to clinic for f/u.   In retrospect, the patient noted that he had 2 beers the night of this episode. Earlier that day he also had a large caffeinated tea and a [address]. Since being seen in the emergency department, he denies any recurrent palpitations. He has been tolerating Metroprolol well.   Current Outpatient Prescriptions  Medication Sig Dispense Refill  . aspirin 81 MG chewable tablet Chew 81 mg by mouth daily.    . cyclobenzaprine (FLEXERIL) 5 MG tablet Take 1 tablet (5 mg total) by mouth at bedtime as needed for muscle spasms (Do not take when driving). 30 tablet 3  . dexlansoprazole (DEXILANT) 60 MG capsule Take 1 capsule (60 mg total) by mouth daily. (Patient taking differently: Take 30 mg by mouth daily. ) 30 capsule 2  . erythromycin (E-MYCIN) 250 MG tablet Tid ac one week out of the month    . hydrocortisone (ANUSOL-HC) 2.5 % rectal cream Place 1 application  rectally 2 (two) times daily. Do x 1 week then as needed 30 g 1  . metoCLOPramide (REGLAN) 10 MG tablet Take 1 tablet (10 mg total) by mouth 3 (three) times daily before meals. 90 tablet 2  . potassium chloride SA (K-DUR,KLOR-CON) 20 MEQ tablet Take 20 mEq by mouth daily as needed (potassium supplement).     . Probiotic Product (PROBIOTIC PO) Take 1 tablet by mouth every other day.    . ranitidine (ZANTAC) 150 MG tablet Take 150 mg by mouth daily.    . sucralfate (CARAFATE) 1 g tablet Take 1 g by mouth 2 (two) times daily as needed (stomach).    . triamterene-hydrochlorothiazide (MAXZIDE-25) 37.5-25 MG tablet Take 1 tablet by mouth daily. 30 tablet 9  . metoprolol tartrate (LOPRESSOR) 25 MG tablet Take 1 tablet (25 mg total) by mouth 2 (two) times daily. 60 tablet 3   No current facility-administered medications for this visit.     Allergies  Allergen Reactions  . Lactose Intolerance (Gi) Nausea And Vomiting  . Pork-Derived Products Nausea And Vomiting and Other (See Comments)     headache    Social History   Social History  . Marital status: Single    Spouse name: N/A  . Number of children: N/A  . Years of education: N/A   Occupational History  . truck driver    Social History Main Topics  . Smoking status: Never Smoker  . Smokeless tobacco: Never Used  . Alcohol use 0.0 oz/week     Comment: occasional on  the weekends (2-3 drinks, liquor and beer)  . Drug use: No  . Sexual activity: Yes   Other Topics Concern  . Not on file   Social History Narrative   Tobacco history none. He does admit to using recreational drugs in the past. He has not smoked marijuana in 7 years.    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 Both of his parents are in there 50 s and are  in good health.  Lives alone in a one story home.  Has 6 children.     Education: college.     Review of Systems: General: negative for chills, fever, night sweats or weight  changes.  Cardiovascular: negative for chest pain, dyspnea on exertion, edema, orthopnea, palpitations, paroxysmal nocturnal dyspnea or shortness of breath Dermatological: negative for rash Respiratory: negative for cough or wheezing Urologic: negative for hematuria Abdominal: negative for nausea, vomiting, diarrhea, bright red blood per rectum, melena, or hematemesis Neurologic: negative for visual changes, syncope, or dizziness All other systems reviewed and are otherwise negative except as noted above.    Blood pressure 124/86, pulse 84, height 5\' 11"  (1.803 m), weight (!) 320 lb 12.8 oz (145.5 kg), SpO2 97 %.  General appearance: alert, cooperative, no distress and obese Neck: no carotid bruit and no JVD Lungs: clear to auscultation bilaterally Heart: regular rate and rhythm, S1, S2 normal, no murmur, click, rub or gallop Extremities: no LEE Pulses: 2+ and symmetric Skin: warm and dry Neurologic: Grossly normal  EKG not performed  ASSESSMENT AND PLAN:   1. H/o symptomatic bradycardia: s/p PPM  2. SVT: s/p 2 previous ablations. He was on metoprolol at one point but this was discontinued after his last ablation. He had a recent recurrence of SVT of 170 bpm, lasting roughly 1 min, prompting a visit to ED. This was in the setting of ETOH and caffeine. Normal troponin x 2, TSH, K and Mg. He has been restarted on metoprolol 25 mg BID. He denies any recurrent palpitations. BP is stable. We will continue this. We discussed avoidance of triggers as well as vagal maneuvers. F/u with Dr. Ladona Ridgelaylor in 6 months. Patient has questions regarding if and when he can return to work. He works as a Hydrographic surveyorcommercial truck driver. Will discuss this with Dr. Ladona Ridgelaylor.    Robbie LisBrittainy Aviana Shevlin PA-C 03/08/2016 2:05 PM

## 2016-03-09 ENCOUNTER — Ambulatory Visit: Payer: BLUE CROSS/BLUE SHIELD | Admitting: Dietician

## 2016-03-15 NOTE — Addendum Note (Signed)
Addended by: Roena MaladyEVONTENNO, Dianna Deshler Y on: 03/15/2016 04:36 PM   Modules accepted: Orders

## 2016-03-19 ENCOUNTER — Other Ambulatory Visit: Payer: Self-pay

## 2016-03-19 DIAGNOSIS — Z0001 Encounter for general adult medical examination with abnormal findings: Secondary | ICD-10-CM

## 2016-03-20 LAB — HIV ANTIBODY (ROUTINE TESTING W REFLEX): HIV Screen 4th Generation wRfx: NONREACTIVE

## 2016-04-03 ENCOUNTER — Telehealth: Payer: Self-pay | Admitting: Cardiology

## 2016-04-03 ENCOUNTER — Encounter: Payer: Self-pay | Admitting: *Deleted

## 2016-04-03 NOTE — Telephone Encounter (Signed)
Attempted to confirm remote transmission with pt. No answer and was unable to leave a message.   

## 2016-04-05 ENCOUNTER — Telehealth: Payer: Self-pay | Admitting: *Deleted

## 2016-04-05 ENCOUNTER — Telehealth: Payer: Self-pay | Admitting: Internal Medicine

## 2016-04-05 DIAGNOSIS — G4733 Obstructive sleep apnea (adult) (pediatric): Secondary | ICD-10-CM

## 2016-04-05 NOTE — Telephone Encounter (Signed)
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

## 2016-04-05 NOTE — Telephone Encounter (Signed)
Spoke with pt. He is needing a letter from VS stating that he is treating the pt for OSA and that he is compliant. This letter will need to be faxed to [phone].  VS - please advise if you are okay with writing this letter. Thanks.

## 2016-04-06 ENCOUNTER — Encounter: Payer: Self-pay | Admitting: *Deleted

## 2016-04-06 ENCOUNTER — Ambulatory Visit: Payer: BLUE CROSS/BLUE SHIELD | Admitting: Neurology

## 2016-04-06 DIAGNOSIS — Z029 Encounter for administrative examinations, unspecified: Secondary | ICD-10-CM

## 2016-04-06 NOTE — Telephone Encounter (Signed)
Follow up  Pt voiced he is needing note from MD stating it's okay operate CDL vehicle. Please fax note to [phone].  Please f/u with pt

## 2016-04-06 NOTE — Telephone Encounter (Signed)
Discussed with Dr. Ladona Ridgelaylor okay to obtain CDL license.  Letter faxed

## 2016-04-11 NOTE — Telephone Encounter (Signed)
Called spoke with pt. He states he no longer needs the letter his PCP wrote it but that he would like for VS to receive the download. As he feels that it is leaking throughout the night. I explained to him the order has been placed and we would call once we received VS's recs. He voiced understanding and had no further questions. Nothing further needed.

## 2016-04-11 NOTE — Telephone Encounter (Signed)
VS please advise 

## 2016-04-11 NOTE — Telephone Encounter (Signed)
Please have his last 30 day CPAP download sent.  Can send letter after review of this.

## 2016-04-13 ENCOUNTER — Encounter: Payer: Self-pay | Admitting: Dietician

## 2016-04-13 NOTE — Progress Notes (Signed)
Have not heard from patient to reschedule the appointment he missed on 03/09/16. Sent discharge letter to MD.

## 2016-04-17 ENCOUNTER — Telehealth: Payer: Self-pay | Admitting: Internal Medicine

## 2016-04-17 NOTE — Telephone Encounter (Signed)
New message    Pt request that the nurse call him. No info given. Please call.

## 2016-04-17 NOTE — Telephone Encounter (Signed)
New message      Calling to give update on [place] [facility]:  Abn lexi scan, moderate area of interior ischemia, cath scheduled for this thurs.

## 2016-04-17 NOTE — Telephone Encounter (Addendum)
Spoke with patient and he is going to have heart cath on Thurs to assess abnormal stress test.  Will call once back in town for follow up with Dr Ladona Ridgelaylor

## 2016-04-17 NOTE — Telephone Encounter (Signed)
Spoke with patient and he had an episode of SVT(160).  BP was fluctuating from 180/90 - 96/85.   [facility] [place]  He was flown via helicopter to hospital.  He will let me know when he gets back in town and I will have him come in to see Dr Ladona Ridgelaylor for a follow up

## 2016-04-24 ENCOUNTER — Telehealth: Payer: Self-pay | Admitting: Internal Medicine

## 2016-04-24 NOTE — Telephone Encounter (Signed)
Talked to patient about his medication. Patient has been on Metoprolol 25 mg since his office visit with Fransico MichaelBrittiany Simmons PA.  Patient stated [place], that changed his medication- patient stated he was started on a combination pill Lisinopril/HCTZ, not to take triamterene/HCTZ. Not sure if Metoprolol is causing patient's issue or the lisinopril/HCTZ. Asked patient to not take the lisinopril for now. [facility] because of SOB and feeling tired. Patient released from the ED, and instructed to follow up with Dr. Ladona Ridgelaylor. Patient stated all his test were negative at the ED. Patient [place] our office. At this time, encouraged patient not to take lisinopril/HCTZ and to go back on Triamterene/HCTZ. Patient thinks his metoprolol is causing him to feel tired. Encouraged patient to take his BP before he takes his metoprolol to make sure his BP is okay. Will forward to Renae, Dr. Lubertha Basqueaylor's nurse to follow-up with patient.

## 2016-04-24 NOTE — Telephone Encounter (Signed)
New message    Pt calling wanting to speak to the nurse about an adverse reaction to his meds. Please call   Pt c/o medication issue:  1. Name of Medication: metoprolol 25 mg  2. How are you currently taking this medication (dosage and times per day)? 25 mg twice a day  3. Are you having a reaction (difficulty breathing--STAT)? Yes  4. What is your medication issue? Gives pt a headache, makes him feel tired and nervous

## 2016-04-25 NOTE — Telephone Encounter (Signed)
Called pt. Following up to see how pt is doing. Pt stated he is doing better. Advised pt to continue Maxzide and hold lisinopril-HCTZ. Informed pt to call us with and questions or concerns. Pt verbalized understanding and thanked me for calling.

## 2016-04-26 ENCOUNTER — Ambulatory Visit: Payer: Self-pay | Admitting: Internal Medicine

## 2016-04-27 ENCOUNTER — Encounter (INDEPENDENT_AMBULATORY_CARE_PROVIDER_SITE_OTHER): Payer: Self-pay | Admitting: Internal Medicine

## 2016-04-27 DIAGNOSIS — Z950 Presence of cardiac pacemaker: Secondary | ICD-10-CM

## 2016-04-30 ENCOUNTER — Other Ambulatory Visit: Payer: Self-pay

## 2016-04-30 ENCOUNTER — Ambulatory Visit (INDEPENDENT_AMBULATORY_CARE_PROVIDER_SITE_OTHER): Payer: Self-pay | Admitting: Internal Medicine

## 2016-04-30 ENCOUNTER — Encounter: Payer: Self-pay | Admitting: Internal Medicine

## 2016-04-30 VITALS — BP 130/78 | HR 88 | Ht 71.0 in | Wt 315.6 lb

## 2016-04-30 DIAGNOSIS — Z950 Presence of cardiac pacemaker: Secondary | ICD-10-CM

## 2016-04-30 DIAGNOSIS — I495 Sick sinus syndrome: Secondary | ICD-10-CM

## 2016-04-30 DIAGNOSIS — K824 Cholesterolosis of gallbladder: Secondary | ICD-10-CM

## 2016-04-30 MED ORDER — METOPROLOL TARTRATE 50 MG PO TABS: ORAL_TABLET | ORAL | 2 refills | Status: DC

## 2016-04-30 NOTE — Progress Notes (Signed)
HPI Jim Hill returns today for follow-up. 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 I suspect he has autonomic dysfunction but this is not well documented. He underwent generator change in 2013. He has gained over 100 pounds in the last 20 years. He lives in [place] but has worked in [place] and now in [place]. He has had 2 catheter ablations in [place] in the past, despite not having any inducible sustained SVT.He has returned to work. He admits to dietary indiscretion. [place] and developed chest pain and underwent cardiac cath after a positive stress test which demonstrated no CAD. His EF is normal.  Allergies  Allergen Reactions  . Lisinopril-Hydrochlorothiazide Shortness Of Breath    Felt like he was going to have a MI  . Lactose Intolerance (Gi) Nausea And Vomiting  . Pork-Derived Products Hives, Nausea And Vomiting and Other (See Comments)     headache     Current Outpatient Prescriptions  Medication Sig Dispense Refill  . aspirin 81 MG chewable tablet Chew 81 mg by mouth daily.    . cyclobenzaprine (FLEXERIL) 5 MG tablet Take 1 tablet (5 mg total) by mouth at bedtime as needed for muscle spasms (Do not take when driving). 30 tablet 3  . erythromycin (E-MYCIN) 250 MG tablet Three times a day ac one week out of the month (use as directed)    . hydrocortisone (ANUSOL-HC) 2.5 % rectal cream Place 1 application rectally 2 (two) times daily. Do x 1 week then as needed 30 g 1  . metoCLOPramide (REGLAN) 10 MG tablet Take 1 tablet (10 mg total) by mouth 3 (three) times daily before meals. 90 tablet 2  . metoprolol (LOPRESSOR) 50 MG tablet May take 1 tablet as needed for palpitations. May take up to 4 tablets a day. 60 tablet 2  . potassium chloride SA (K-DUR,KLOR-CON) 20 MEQ tablet Take 20 mEq by mouth  daily as needed (potassium supplement).     . Probiotic Product (PROBIOTIC PO) Take 1 tablet by mouth every other day.    . ranitidine (ZANTAC) 150 MG tablet Take 150 mg by mouth daily.    Marland Kitchen triamterene-hydrochlorothiazide (MAXZIDE-25) 37.5-25 MG tablet Take 1 tablet by mouth daily. 30 tablet 9   No current facility-administered medications for this visit.      Past Medical History:  Diagnosis Date  . Barrett's esophagus without dysplasia - subcentimeter changes 06/19/2015  . Chest pain    normal LHC 2008;  ETT-Echo 8/10: normal  . 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 Diastolic heart failure (HCC)   . Gastroparesis 08/19/2015  . GERD (gastroesophageal reflux disease)   . Hypertension   . Obesity   . OSA (obstructive sleep apnea)   . Other specified cardiac dysrhythmias(427.89)   . Sleep apnea   . SVT (supraventricular tachycardia) (HCC)   . Symptomatic bradycardia    s/p pacer in 2006 in [place]    ROS:   All systems reviewed and negative except as noted in the HPI.   Past Surgical History:  Procedure Laterality Date  . APPENDECTOMY  1994  . COLONOSCOPY    . INSERT / REPLACE / REMOVE PACEMAKER     status post pacemaker implant August 2006  . PERMANENT PACEMAKER GENERATOR CHANGE N/A 04/16/2012   Procedure: PERMANENT PACEMAKER GENERATOR CHANGE;  Surgeon: Marinus Maw, MD;  Location:  [facility] CATH [facility];  Service: Cardiovascular;  Laterality: N/A;  . UPPER GASTROINTESTINAL ENDOSCOPY       Family History  Problem Relation Age of Onset  . Hypertension Maternal Grandmother   . Hypertension Maternal Grandfather   . Hypertension Paternal Grandmother   . Heart disease Paternal Grandfather   . Hypertension Paternal Grandfather   . Prostate cancer Paternal Grandfather   . Healthy Mother   . Healthy Father   . Hypertension Other   . Diabetes Other   . Cancer Other   . CAD Other   . Pancreatic cancer Paternal Uncle      Social History   Social History  . Marital status:  Single    Spouse name: N/A  . Number of children: N/A  . Years of education: N/A   Occupational History  . truck driver    Social History Main Topics  . Smoking status: Never Smoker  . Smokeless tobacco: Never Used  . Alcohol use 0.0 oz/week     Comment: occasional on the weekends (2-3 drinks, liquor and beer)  . Drug use: No  . Sexual activity: Yes   Other Topics Concern  . Not on file   Social History Narrative   Tobacco history none. He does admit to using recreational drugs in the past. He has not smoked marijuana in 7 years.    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 Both of his parents are in there 50 s and are  in good health.  Lives alone in a one story home.  Has 6 children.     Education: college.     BP 130/78   Pulse 88   Ht 5\' 11"  (1.803 m)   Wt (!) 315 lb 9.6 oz (143.2 kg)   BMI 44.02 kg/m   Physical Exam:  Obese appearing 45 year old man, NAD HEENT: Unremarkable Neck:  6 cm JVD, no thyromegally Back:  No CVA tenderness Lungs:  Clear, with no wheezes, rales, or rhonchi. HEART:  Regular rate rhythm, no murmurs, no rubs, no clicks Abd:  soft, positive bowel sounds, no organomegally, no rebound, no guarding Ext:  2 plus pulses, no edema, no cyanosis, no clubbing Skin:  No rashes no nodules Neuro:  CN II through XII intact, motor grossly intact  DEVICE  Normal device function.  See PaceArt for details. He is programmed VVI at 30.  Assess/Plan: 1. 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 Will recheck in several months. His symptoms are resolved. 2. HTN - his blood pressure is high today but he has not yet taken his medication. I encouraged him to lose weight and not to miss his meds. 3. Obesity - I discussed dietary changes. He is pending a visit to the dietician. 4. Chest pain - I have encouraged the patient to use antacids.  5. Palpitations - he has what I think is sinus tachycardia. I  have asked the patient take additional beta blockers if he has recurrent symptoms. I would not suggest any more ablation.  Leonia ReevesGregg Aylani Hill,M.D.

## 2016-04-30 NOTE — Patient Instructions (Addendum)
Medication Instructions:  Your physician has recommended you make the following change in your medication:  Metoprolol 50 mg take 1 tablet as needed for palpitations. May take up to 4 tablets a day.   Labwork: None Ordered   Testing/Procedures: None Ordered   Follow-Up: Your physician wants you to follow-up in: 6 months with Dr. Ladona Ridgelaylor. You will receive a reminder letter in the mail two months in advance. If you don't receive a letter, please call our office to schedule the follow-up appointment.  Remote monitoring is used to monitor your Pacemaker from home. This monitoring reduces the number of office visits required to check your device to one time per year. It allows us to keep an eye on the functioning of your device to ensure it is working properly. You are scheduled for a device check from home on 07/30/16. You may send your transmission at any time that day. If you have a wireless device, the transmission will be sent automatically. After your physician reviews your transmission, you will receive a postcard with your next transmission date.    Any Other Special Instructions Will Be Listed Below (If Applicable).     If you need a refill on your cardiac medications before your next appointment, please call your pharmacy.

## 2016-05-01 LAB — CUP PACEART INCLINIC DEVICE CHECK
Battery Impedance: 295 Ohm
Battery Voltage: 2.78 V
Brady Statistic RV Percent Paced: 0.1 % — CL
Implantable Lead Implant Date: 20060823
Implantable Lead Location: 753859
Implantable Lead Location: 753860
Lead Channel Setting Pacing Amplitude: 2.5 V
Lead Channel Setting Pacing Pulse Width: 0.4 ms
Lead Channel Setting Sensing Sensitivity: 2 mV
MDC IDC LEAD IMPLANT DT: 20060823
MDC IDC MSMT BATTERY REMAINING LONGEVITY: 126 mo
MDC IDC MSMT LEADCHNL RV IMPEDANCE VALUE: 558 Ohm
MDC IDC MSMT LEADCHNL RV PACING THRESHOLD AMPLITUDE: 1.25 V
MDC IDC MSMT LEADCHNL RV PACING THRESHOLD PULSEWIDTH: 0.4 ms
MDC IDC MSMT LEADCHNL RV SENSING INTR AMPL: 5.6 mV
MDC IDC SESS DTM: 20171010100401

## 2016-05-03 ENCOUNTER — Telehealth: Payer: Self-pay | Admitting: Internal Medicine

## 2016-05-03 ENCOUNTER — Ambulatory Visit: Payer: Self-pay | Admitting: Internal Medicine

## 2016-05-03 DIAGNOSIS — Z0289 Encounter for other administrative examinations: Secondary | ICD-10-CM

## 2016-05-03 NOTE — Telephone Encounter (Signed)
Patient did not come in for their appointment today for hospital follow up.  Please let me know if patient needs to be contacted immediately for follow up or no follow up needed. °

## 2016-05-03 NOTE — Telephone Encounter (Signed)
No does not need to followup. Also, needs to be dismissed due to 6+ no shows

## 2016-05-07 ENCOUNTER — Encounter: Payer: Self-pay | Admitting: Internal Medicine

## 2016-05-07 ENCOUNTER — Ambulatory Visit (HOSPITAL_COMMUNITY): Payer: Self-pay

## 2016-05-07 ENCOUNTER — Telehealth: Payer: Self-pay | Admitting: Internal Medicine

## 2016-05-07 NOTE — Telephone Encounter (Signed)
Patient dismissed from [facility] by Nicki Reaperegina Baity NP-C , effective May 07, 2016. Dismissal letter sent out by certified / registered mail. DAJ

## 2016-05-10 ENCOUNTER — Ambulatory Visit: Payer: Self-pay | Admitting: Internal Medicine

## 2016-05-14 NOTE — Telephone Encounter (Signed)
Pt called wanted to know why he was dismissed, explained it was due to multiple no show appointments.  12/31/14;09/29/15;10/24/15;11/21/15;05/03/16.  Pt says he canceled appointment on 05/03/16.  He wanted pcp to know he was very disappointed because he has been coming and he would "work it out another way".  Checked with manager, there is not recourse after pt has been dismissed

## 2016-05-24 ENCOUNTER — Telehealth: Payer: Self-pay | Admitting: Internal Medicine

## 2016-05-24 NOTE — Telephone Encounter (Signed)
Received signed domestic return receipt verifying delivery of certified letter on May 12, 2016. Article number [id number]

## 2016-05-28 ENCOUNTER — Ambulatory Visit: Payer: BLUE CROSS/BLUE SHIELD | Admitting: Pulmonary Disease

## 2016-06-07 ENCOUNTER — Telehealth: Payer: Self-pay | Admitting: Internal Medicine

## 2016-06-07 NOTE — Telephone Encounter (Signed)
[facility] [employer] is calling about a claim Disability to find out when [name] was released to go back to work and if their is any restrictions . Please reference to #[id number].Marland Kitchen. Please call   Thanks

## 2016-06-08 NOTE — Telephone Encounter (Signed)
Called, left a voice msg to call our office.

## 2016-06-18 ENCOUNTER — Telehealth: Payer: Self-pay | Admitting: Internal Medicine

## 2016-06-18 NOTE — Telephone Encounter (Signed)
New Emssage  Pt call requesting to speak with RN about his medications. Pt states he has some questions about him being able to sleep. Please call back to discuss

## 2016-06-18 NOTE — Telephone Encounter (Addendum)
Called, spoke with pt. Pt asked if he could take Metoprolol at night for his palpitations. Reviewed last office note with Dr. Ladona Ridgelaylor from office visit on 04/30/16. Informed Dr. Ladona Ridgelaylor recommended pt to take Metoprolol 50 mg (1 tablet) as needed for palpitations. Pt may take up to 4 tablets in a 24 hr period. Pt verbalized understanding and agreed with plan.

## 2016-06-29 ENCOUNTER — Telehealth: Payer: Self-pay | Admitting: Internal Medicine

## 2016-06-29 NOTE — Telephone Encounter (Signed)
Spoke with patient.  He reports that he was recently seen at an ED out of town while he was working due to palpitations, fatigue, and transient dizziness.  He states they "worked him up for everything cardiac" and found no abnormalities.  His PPM was not checked.  Reviewed transmission, remains programmed VVI 30, no VHR episodes noted.  Advised patient that no episodes noted and that Dr. Lubertha Basqueaylor's OV note from 04/30/16 relates the palpitations to sinus tachycardia.  Lopressor 50mg  PRN was ordered.  Reviewed medications with patient.  He states he has been taking Lopressor 50mg  daily rather than PRN.  Advised patient that he should be taking it PRN.  Patient verbalizes understanding of instructions.  He reports his BP has been stable in the 110s-130s/80s, even during episodes of dizziness.  Advised patient to take Lopressor as instructed up to four times daily and call back if he is still experiencing these symptoms.  Patient verbalizes understanding and is appreciative of call.

## 2016-06-29 NOTE — Telephone Encounter (Signed)
Pt called and stated that he has been feeling weak, dizzy, and palpitations. Pt just started taking metoprolol 50 MG as needed. He stated that at first he was taking them as needed but he has been taking 50 MG for one week now and he doesn't know if the symptoms are related the the medicine change or if it is something else. Informed pt to send a remote transmission. A device tech nurse will review and call pt back with results. Pt verbalized understanding.

## 2016-06-29 NOTE — Telephone Encounter (Signed)
New Message:    Please call,needs to talk to you about his medicine.

## 2016-07-08 IMAGING — CT CT ANGIO CHEST
2 of 6 series · 18 of 36 positions shown · IV contrast (APPLIED)
Comparison: 11/10/2014 radiograph

CLINICAL DATA: Dyspnea, chest pain.  Status post ablation.

EXAM:
CT ANGIOGRAPHY CHEST WITH CONTRAST
TECHNIQUE: Multidetector CT imaging of the chest was performed using the
standard protocol during bolus administration of intravenous
contrast. Multiplanar CT image reconstructions and MIPs were
obtained to evaluate the vascular anatomy.
CONTRAST:  100 cc Omnipaque 350

[Series 5: pe 1.0 thins · axial · 0.76mm/px · z∈[-320,-71]mm · 17 of 281 slices shown]
[im 16/281  lung]
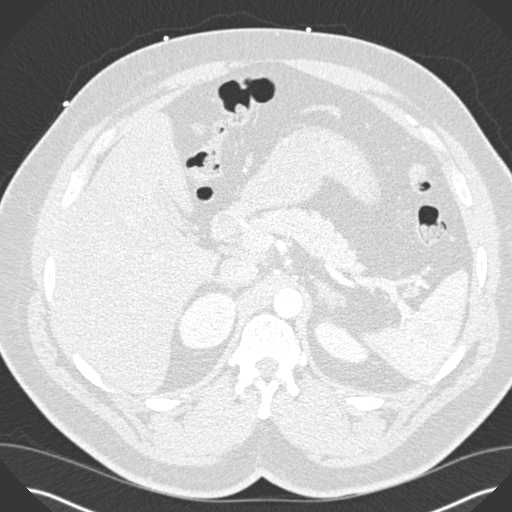
[im 32/281  mediastinal]
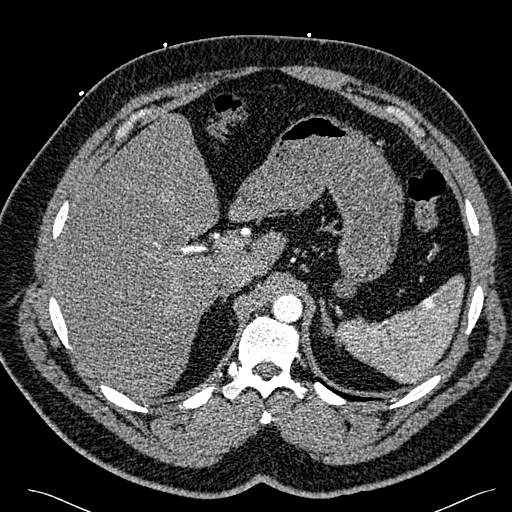
[im 47/281  lung]
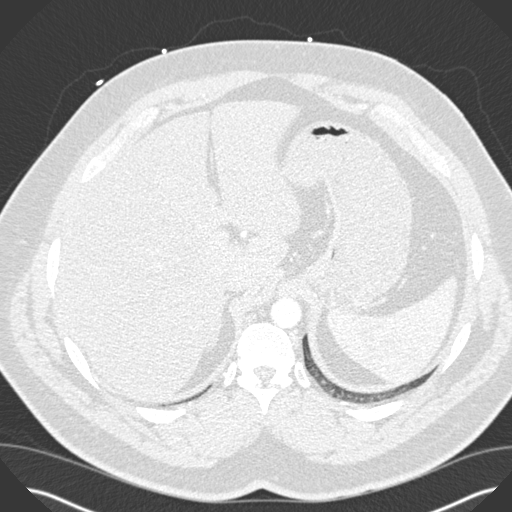
[im 63/281  mediastinal]
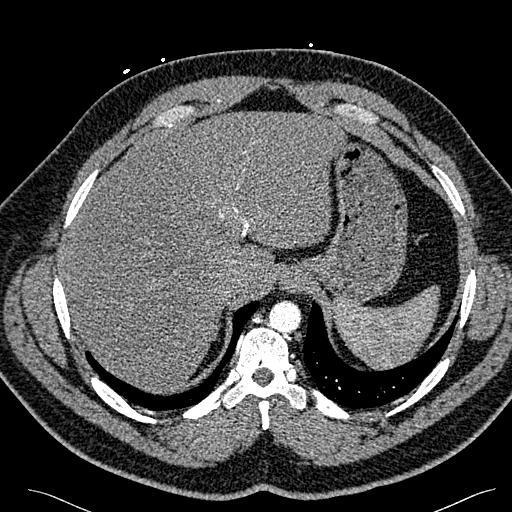
[im 78/281  lung]
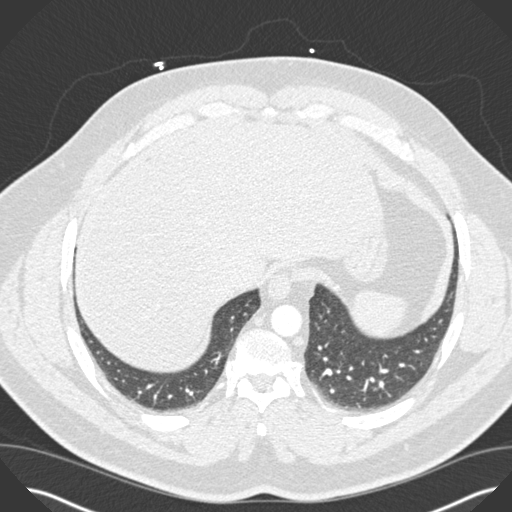
[im 94/281  mediastinal]
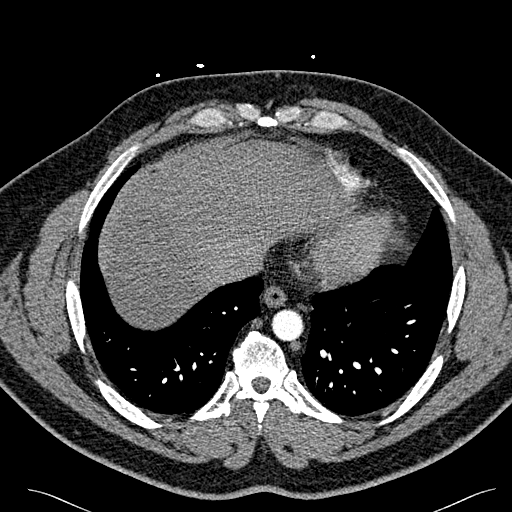
[im 109/281  lung]
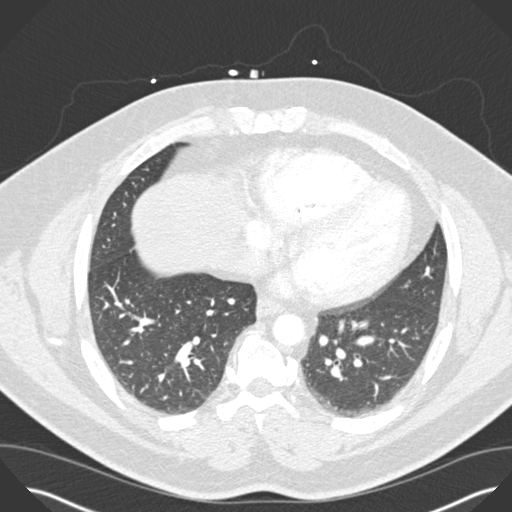
[im 125/281  mediastinal]
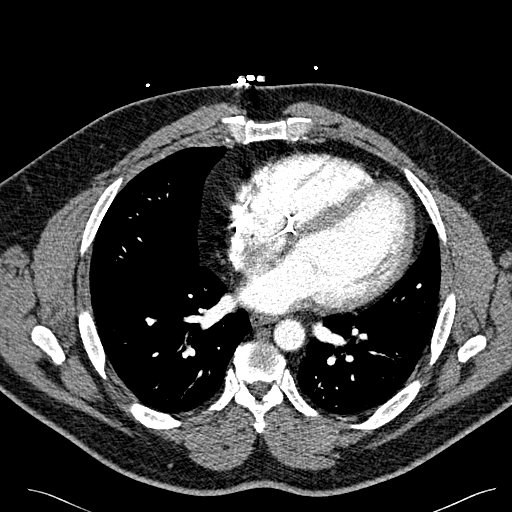
[im 141/281  lung]
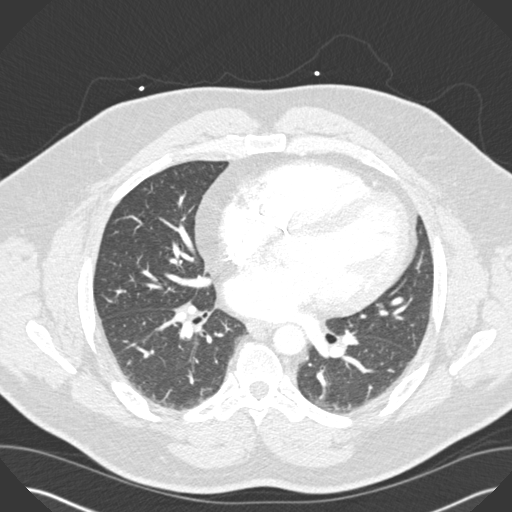
[im 156/281  mediastinal]
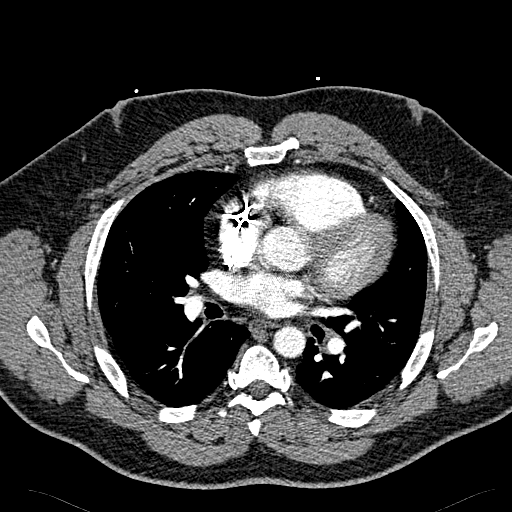
[im 172/281  lung]
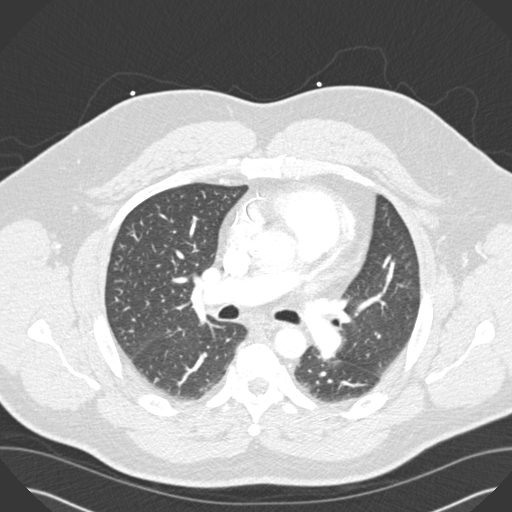
[im 187/281  mediastinal]
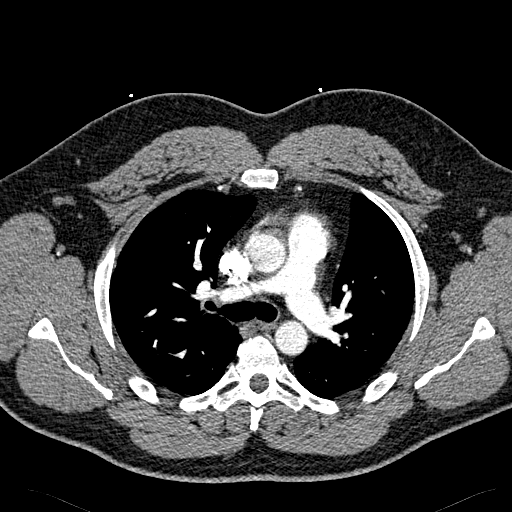
[im 203/281  lung]
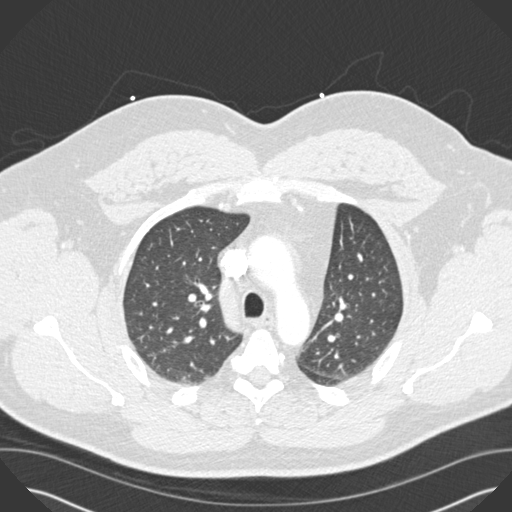
[im 218/281  mediastinal]
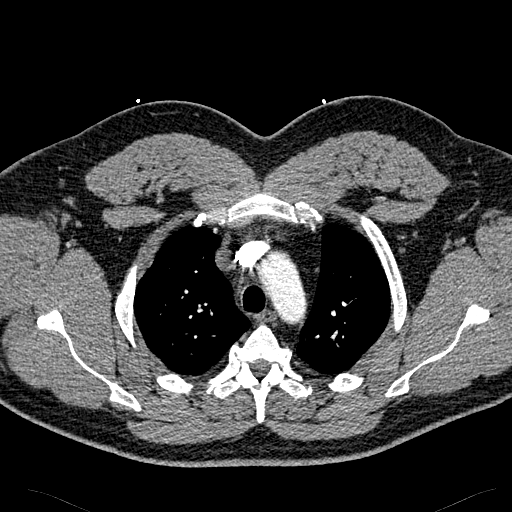
[im 234/281  lung]
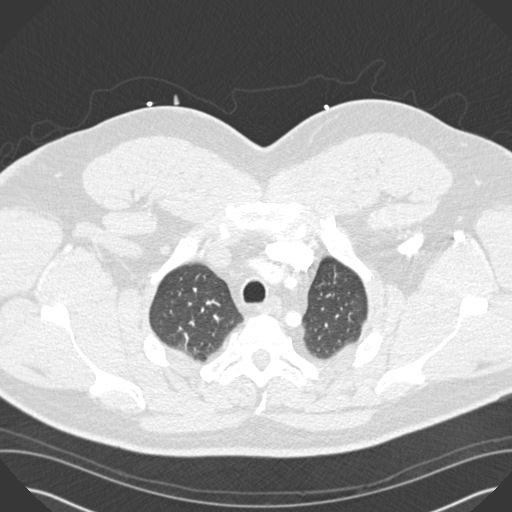
[im 249/281  mediastinal]
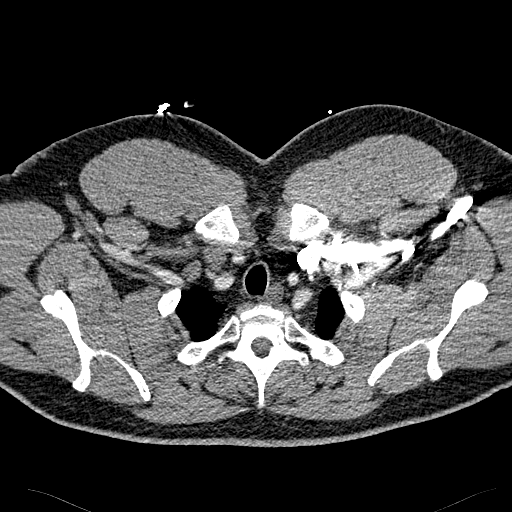
[im 265/281  lung]
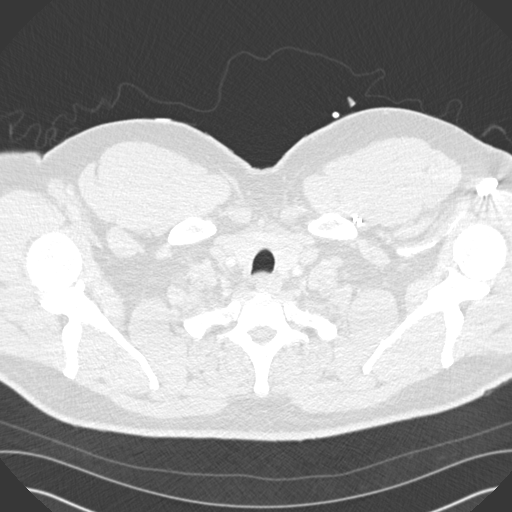

[Series 7: cor pe 2.0 mpr · coronal · 0.69mm/px · 1 of 134 slices shown]
[im 67/134  mediastinal]
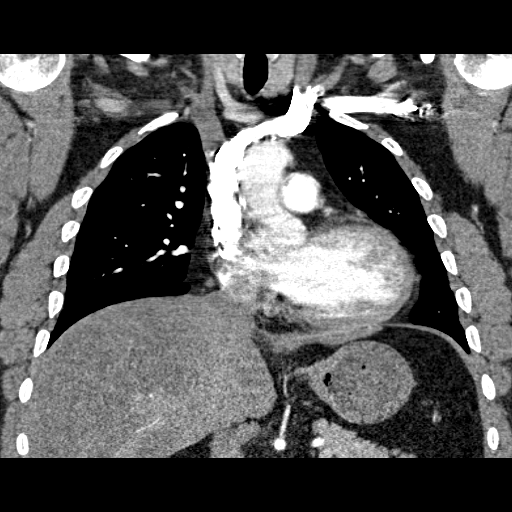

[18 of 36 positions shown; findings below may reference images not displayed]

FINDINGS: Suboptimal contrast bolus timing. Allowing for this, no pulmonary
embolism identified.

Normal caliber thoracic aorta and branch vessels.

Left chest wall battery pack with incompletely imaged leads, tips
along the right atrium and right ventricle. Normal heart size. Trace
pericardial fluid. No pleural effusion. No intrathoracic
lymphadenopathy.

Hepatic steatosis. Rounded hypervascular focus within the spleen is
favored to be incidental such as a hemangioma.

Central airways are patent. No pneumothorax. No confluent airspace
opacity. No suspicious nodularity.

Multilevel degenerative changes.

Review of the MIP images confirms the above findings.
IMPRESSION: No pulmonary embolism or acute intrathoracic process identified.

Hepatic steatosis.

## 2016-07-24 ENCOUNTER — Other Ambulatory Visit: Payer: Self-pay | Admitting: Internal Medicine

## 2016-07-30 ENCOUNTER — Ambulatory Visit (INDEPENDENT_AMBULATORY_CARE_PROVIDER_SITE_OTHER): Payer: Self-pay | Admitting: *Deleted

## 2016-07-30 ENCOUNTER — Telehealth: Payer: Self-pay | Admitting: Cardiology

## 2016-07-30 DIAGNOSIS — I495 Sick sinus syndrome: Secondary | ICD-10-CM

## 2016-07-30 NOTE — Telephone Encounter (Signed)
LMOVM reminding pt to send remote transmission.   

## 2016-07-31 NOTE — Progress Notes (Signed)
Remote pacemaker transmission.   

## 2016-08-01 ENCOUNTER — Telehealth: Payer: Self-pay | Admitting: Internal Medicine

## 2016-08-01 ENCOUNTER — Encounter: Payer: Self-pay | Admitting: Cardiology

## 2016-08-01 NOTE — Telephone Encounter (Signed)
New message  Pt call requesting to speak with RN about results from his remote transmission. Please call back to discuss

## 2016-08-01 NOTE — Telephone Encounter (Signed)
[name] is calling to see if he has had any episodes on the transmission sent 07/30/16. He had 1 high ventricular rate at 167bpm lasting 58 seconds on 07/09/16. He reports that he occasionally feels fatigued but that he doesn't know what is causing it. I have asked if he can record his HR during these symptoms and instructed him how to check a radial pulse. He verbalizes understanding and knows to call back if these symptoms are associated with a decreased HR.

## 2016-08-02 LAB — CUP PACEART REMOTE DEVICE CHECK
Date Time Interrogation Session: 20180108193715
Implantable Lead Implant Date: 20060823
Implantable Lead Location: 753859
Implantable Pulse Generator Implant Date: 20130925
Lead Channel Impedance Value: 67 Ohm
Lead Channel Pacing Threshold Amplitude: 1.25 V
Lead Channel Pacing Threshold Pulse Width: 0.4 ms
Lead Channel Setting Pacing Amplitude: 2.5 V
Lead Channel Setting Sensing Sensitivity: 2 mV
MDC IDC LEAD IMPLANT DT: 20060823
MDC IDC LEAD LOCATION: 753860
MDC IDC MSMT BATTERY IMPEDANCE: 319 Ohm
MDC IDC MSMT BATTERY REMAINING LONGEVITY: 115 mo
MDC IDC MSMT BATTERY VOLTAGE: 2.78 V
MDC IDC MSMT LEADCHNL RV IMPEDANCE VALUE: 549 Ohm
MDC IDC SET LEADCHNL RV PACING PULSEWIDTH: 0.4 ms
MDC IDC STAT BRADY RV PERCENT PACED: 0 %

## 2016-08-17 ENCOUNTER — Telehealth: Payer: Self-pay | Admitting: Internal Medicine

## 2016-08-17 NOTE — Telephone Encounter (Signed)
New message    Pt verbalized that he is calling for rn to see if Dr.Taylor has the fax for the his disability request

## 2016-08-22 NOTE — Telephone Encounter (Signed)
Called spoke with patient-he is aware of the release form & payment that needs to be taken care of before process of paperwork will begin. [employer] has mailed [employer] Packet Out.

## 2016-08-22 NOTE — Telephone Encounter (Signed)
Called, spoke with pt. Pt stated he was inquiring about disability paperwork. Will forward to medical records. Pt verbalized understandng.

## 2016-09-14 ENCOUNTER — Telehealth: Payer: Self-pay | Admitting: Internal Medicine

## 2016-09-14 NOTE — Telephone Encounter (Signed)
Called patient.  Patient stated that he is feeling palpitations usually after he eats.  I asked him about how he's been taking his metoprolol.  He stated that he has not been taking it 4 times a day, prn.  I explained that this medication is to be taken up to 4 times a day as long as he is having palpitations.  He says that he will do this.  I also asked if his Barretts and GERD are under control.  He stated that he doesn't think they are.  He has seen his PCP about this and was advised to see Dr Ladona Ridgelaylor before referral to a Gastro.  Patient expressed understanding with plan.  Patient is seeing Dr Ladona Ridgelaylor on 09-19-16.

## 2016-09-14 NOTE — Telephone Encounter (Signed)
New Message    Pt is calling to ask a few questions about his health, he went to ER for palpitations they did stress test and they said it was fine. Pt states he is getting palpitations after he eats.

## 2016-09-18 NOTE — Telephone Encounter (Signed)
New Message  Pt voiced he would like for nurse or someone to contact him.  Please f/u

## 2016-09-18 NOTE — Telephone Encounter (Signed)
Patient called complaining of pain in his leg that radiates to his left shoulder, arm, and back. The patient states that this pain has been intermittent for the past 2 days. He denies any chest pain, SOB, lightheadedness, dizziness, or any other symptoms. The patient states that he a normal stress test last week. The patient denies any recent strenuous activity.The patient states that he has an appointment with Dr. Ladona Ridgelaylor tomorrow. Patient advised to keep appointment with Dr. Ladona Ridgelaylor tomorrow and follow up with his PCP. Patient verbalized understanding.

## 2016-09-19 ENCOUNTER — Ambulatory Visit (INDEPENDENT_AMBULATORY_CARE_PROVIDER_SITE_OTHER): Payer: Self-pay | Admitting: Internal Medicine

## 2016-09-19 ENCOUNTER — Encounter: Payer: Self-pay | Admitting: Internal Medicine

## 2016-09-19 VITALS — BP 132/94 | HR 87 | Ht 71.0 in | Wt 306.6 lb

## 2016-09-19 DIAGNOSIS — Z950 Presence of cardiac pacemaker: Secondary | ICD-10-CM

## 2016-09-19 DIAGNOSIS — I495 Sick sinus syndrome: Secondary | ICD-10-CM

## 2016-09-19 LAB — CUP PACEART INCLINIC DEVICE CHECK
Battery Impedance: 320 Ohm
Implantable Lead Implant Date: 20060823
Implantable Lead Location: 753860
Implantable Lead Model: 5076
Lead Channel Impedance Value: 67 Ohm
Lead Channel Pacing Threshold Amplitude: 1.25 V
Lead Channel Pacing Threshold Amplitude: 1.5 V
Lead Channel Pacing Threshold Pulse Width: 0.4 ms
Lead Channel Pacing Threshold Pulse Width: 0.4 ms
Lead Channel Setting Sensing Sensitivity: 2 mV
MDC IDC LEAD IMPLANT DT: 20060823
MDC IDC LEAD LOCATION: 753859
MDC IDC MSMT BATTERY REMAINING LONGEVITY: 125 mo
MDC IDC MSMT BATTERY VOLTAGE: 2.78 V
MDC IDC MSMT LEADCHNL RV IMPEDANCE VALUE: 577 Ohm
MDC IDC MSMT LEADCHNL RV SENSING INTR AMPL: 5.6 mV
MDC IDC PG IMPLANT DT: 20130925
MDC IDC SESS DTM: 20180228154821
MDC IDC SET LEADCHNL RV PACING AMPLITUDE: 3 V
MDC IDC SET LEADCHNL RV PACING PULSEWIDTH: 0.4 ms
MDC IDC STAT BRADY RV PERCENT PACED: 0 %

## 2016-09-19 MED ORDER — POTASSIUM CHLORIDE CRYS ER 20 MEQ PO TBCR: 20.0000 meq | EXTENDED_RELEASE_TABLET | Freq: Every day | ORAL | 3 refills | Status: DC | PRN

## 2016-09-19 NOTE — Patient Instructions (Addendum)
Medication Instructions:  Your physician recommends that you continue on your current medications as directed. Please refer to the Current Medication list given to you today.   Labwork: None Ordered   Testing/Procedures: Your physician has requested that you have an exercise tolerance test.   Follow-Up: To be determined after results of stress test.    Any Other Special Instructions Will Be Listed Below (If Applicable).     If you need a refill on your cardiac medications before your next appointment, please call your pharmacy.

## 2016-09-19 NOTE — Progress Notes (Signed)
HPI Jim Hill returns today for follow-up. 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 I suspect he has autonomic dysfunction but this is not well documented. He underwent generator change in 2013. He has gained over 100 pounds in the last 20 years. He lives in [place] but has worked in [place], [place] and [place]. He has had 2 catheter ablations in [place] in the past, despite not having any inducible sustained SVT.He has returned to work. He admits to dietary indiscretion. [place] and developed chest pain and underwent cardiac cath after a positive stress test which demonstrated no CAD. His EF is normal.  Allergies  Allergen Reactions  . Lisinopril-Hydrochlorothiazide Shortness Of Breath    Felt like he was going to have a MI  . Lactose Intolerance (Gi) Nausea And Vomiting  . Pork-Derived Products Hives, Nausea And Vomiting and Other (See Comments)     headache     Current Outpatient Prescriptions  Medication Sig Dispense Refill  . aspirin 81 MG chewable tablet Chew 81 mg by mouth daily.    . cyclobenzaprine (FLEXERIL) 5 MG tablet Take 1 tablet (5 mg total) by mouth at bedtime as needed for muscle spasms (Do not take when driving). 30 tablet 3  . erythromycin (E-MYCIN) 250 MG tablet Three times a day ac one week out of the month (use as directed)    . hydrocortisone (ANUSOL-HC) 2.5 % rectal cream Place 1 application rectally 2 (two) times daily. Do x 1 week then as needed 30 g 1  . metoCLOPramide (REGLAN) 10 MG tablet Take 1 tablet (10 mg total) by mouth 3 (three) times daily before meals. 90 tablet 2  . metoprolol (LOPRESSOR) 50 MG tablet May take 1 tablet as needed for palpitations. May take up to 4 tablets a day. 60 tablet 2  . potassium chloride SA (K-DUR,KLOR-CON) 20 MEQ tablet Take 20 mEq by mouth  daily as needed (potassium supplement).     . Probiotic Product (PROBIOTIC PO) Take 1 tablet by mouth every other day.    . ranitidine (ZANTAC) 150 MG tablet Take 150 mg by mouth daily.    Marland Kitchen triamterene-hydrochlorothiazide (MAXZIDE-25) 37.5-25 MG tablet TAKE ONE TABLET BY MOUTH ONCE DAILY 30 tablet 9   No current facility-administered medications for this visit.      Past Medical History:  Diagnosis Date  . Barrett's esophagus without dysplasia - subcentimeter changes 06/19/2015  . Chest pain    normal LHC 2008;  ETT-Echo 8/10: normal  . 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 Diastolic heart failure (HCC)   . Gastroparesis 08/19/2015  . GERD (gastroesophageal reflux disease)   . Hypertension   . Obesity   . OSA (obstructive sleep apnea)   . Other specified cardiac dysrhythmias(427.89)   . Sleep apnea   . SVT (supraventricular tachycardia) (HCC)   . Symptomatic bradycardia    s/p pacer in 2006 in [place]    ROS:   All systems reviewed and negative except as noted in the HPI.   Past Surgical History:  Procedure Laterality Date  . APPENDECTOMY  1994  . COLONOSCOPY    . INSERT / REPLACE / REMOVE PACEMAKER     status post pacemaker implant August 2006  . PERMANENT PACEMAKER GENERATOR CHANGE N/A 04/16/2012   Procedure: PERMANENT PACEMAKER GENERATOR CHANGE;  Surgeon: Marinus Maw, MD;  Location:  [facility];  Service: Cardiovascular;  Laterality: N/A;  . UPPER GASTROINTESTINAL ENDOSCOPY       Family History  Problem Relation Age of Onset  . Hypertension Maternal Grandmother   . Hypertension Maternal Grandfather   . Hypertension Paternal Grandmother   . Heart disease Paternal Grandfather   . Hypertension Paternal Grandfather   . Prostate cancer Paternal Grandfather   . Healthy Mother   . Healthy Father   . Hypertension Other   . Diabetes Other   . Cancer Other   . CAD Other   . Pancreatic cancer Paternal Uncle      Social History   Social History  . Marital  status: Single    Spouse name: N/A  . Number of children: N/A  . Years of education: N/A   Occupational History  . truck driver    Social History Main Topics  . Smoking status: Never Smoker  . Smokeless tobacco: Never Used  . Alcohol use 0.0 oz/week     Comment: occasional on the weekends (2-3 drinks, liquor and beer)  . Drug use: No  . Sexual activity: Yes   Other Topics Concern  . Not on file   Social History Narrative   Tobacco history none. He does admit to using recreational drugs in the past. He has not smoked marijuana in 7 years.    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 Both of his parents are in there 50 s and are  in good health.  Lives alone in a one story home.  Has 6 children.     Education: college.     BP (!) 132/94   Pulse 87   Ht 5\' 11"  (1.803 m)   Wt (!) 306 lb 9.6 oz (139.1 kg)   SpO2 97%   BMI 42.76 kg/m   Physical Exam:  Obese appearing 46 year old man, NAD HEENT: Unremarkable Neck:  6 cm JVD, no thyromegally Back:  No CVA tenderness Lungs:  Clear, with no wheezes, rales, or rhonchi. HEART:  Regular rate rhythm, no murmurs, no rubs, no clicks Abd:  soft, positive bowel sounds, no organomegally, no rebound, no guarding Ext:  2 plus pulses, no edema, no cyanosis, no clubbing Skin:  No rashes no nodules Neuro:  CN II through XII intact, motor grossly intact  DEVICE  Normal device function.  See PaceArt for details. He is programmed VVI at 30.  Assess/Plan: 1. 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 Will recheck in several months. His symptoms are resolved. 2. HTN - his blood pressure is better today. I have encouraged him to lose weight and not to miss his meds. 3. Obesity - I discussed dietary changes.  4. Chest pain - I have encouraged the patient to use antacids. He has not undergone exercise testing in over 2 years 5. Palpitations - he has what I think is sinus tachycardia.  I have asked the patient to undergo stress testing to assess him for exercise induced arrhythmias and chronotropic respone.  Jim Hill,M.D.

## 2016-09-25 ENCOUNTER — Ambulatory Visit (INDEPENDENT_AMBULATORY_CARE_PROVIDER_SITE_OTHER): Payer: Self-pay

## 2016-09-25 DIAGNOSIS — Z950 Presence of cardiac pacemaker: Secondary | ICD-10-CM

## 2016-09-25 DIAGNOSIS — I495 Sick sinus syndrome: Secondary | ICD-10-CM

## 2016-09-25 LAB — EXERCISE TOLERANCE TEST
CHL CUP RESTING HR STRESS: 79 {beats}/min
CHL CUP STRESS STAGE 1 GRADE: 0 %
CHL CUP STRESS STAGE 1 HR: 89 {beats}/min
CHL CUP STRESS STAGE 2 GRADE: 0 %
CHL CUP STRESS STAGE 2 SPEED: 1 mph
CHL CUP STRESS STAGE 3 HR: 96 {beats}/min
CHL CUP STRESS STAGE 3 SPEED: 1 mph
CHL CUP STRESS STAGE 4 DBP: 77 mmHg
CHL CUP STRESS STAGE 5 SPEED: 2.5 mph
CHL CUP STRESS STAGE 6 GRADE: 14 %
CHL CUP STRESS STAGE 6 SPEED: 3.4 mph
CHL CUP STRESS STAGE 7 DBP: 38 mmHg
CHL CUP STRESS STAGE 7 GRADE: 0 %
CHL CUP STRESS STAGE 7 HR: 146 {beats}/min
CHL CUP STRESS STAGE 8 DBP: 68 mmHg
CHL CUP STRESS STAGE 8 GRADE: 0 %
CHL RATE OF PERCEIVED EXERTION: 17
CSEPED: 8 min
CSEPEDS: 0 s
CSEPEW: 10.1 METS
CSEPHR: 94 %
CSEPPMHR: 93 %
MPHR: 175 {beats}/min
Peak HR: 164 {beats}/min
Stage 1 DBP: 85 mmHg
Stage 1 SBP: 123 mmHg
Stage 1 Speed: 0 mph
Stage 2 HR: 96 {beats}/min
Stage 3 Grade: 0 %
Stage 4 Grade: 10 %
Stage 4 HR: 123 {beats}/min
Stage 4 SBP: 168 mmHg
Stage 4 Speed: 1.7 mph
Stage 5 DBP: 80 mmHg
Stage 5 Grade: 12 %
Stage 5 HR: 144 {beats}/min
Stage 5 SBP: 214 mmHg
Stage 6 HR: 164 {beats}/min
Stage 7 SBP: 203 mmHg
Stage 7 Speed: 0 mph
Stage 8 HR: 105 {beats}/min
Stage 8 SBP: 157 mmHg
Stage 8 Speed: 0 mph

## 2016-09-28 ENCOUNTER — Telehealth: Payer: Self-pay | Admitting: Internal Medicine

## 2016-09-28 NOTE — Telephone Encounter (Signed)
Patient made aware that we only have preliminary results for his stress test and that Dr. Ladona Ridgelaylor has not reviewed them yet. Patient would like a copy of the results whenever they have been finalized.

## 2016-09-28 NOTE — Telephone Encounter (Signed)
New Message    Pt states Jim Hill told him to call back today to get results, so that he can take them with him 09/29/16 to finish test,    HE NEEDS THESE RESULTS BEFORE 5PM TODAY!

## 2016-10-04 ENCOUNTER — Telehealth: Payer: Self-pay | Admitting: Internal Medicine

## 2016-10-04 NOTE — Telephone Encounter (Signed)
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 Will forward to Dr. Ladona Ridgelaylor for advisement.

## 2016-10-04 NOTE — Telephone Encounter (Signed)
New Message  Pt call requesting to speak with RN about getting a clearance note to return back to work. Please call back to discuss

## 2016-10-08 ENCOUNTER — Telehealth: Payer: Self-pay | Admitting: Internal Medicine

## 2016-10-08 NOTE — Telephone Encounter (Signed)
Follow up was to be arranged based on results of stress test.  Will forward to Dr. Ladona Ridgelaylor for review and to determine follow up

## 2016-10-08 NOTE — Telephone Encounter (Signed)
Called pt to confirm appt for 10-10-16, pt states he was just seen 09-19-16 and had a stress test 09-25-16, feels this may be  an old appt , wants to know if he still needs to be seen then if not when? pls call

## 2016-10-09 ENCOUNTER — Encounter: Payer: Self-pay | Admitting: Internal Medicine

## 2016-10-09 NOTE — Telephone Encounter (Signed)
Stress test shows that he had a high blood pressure response to exercise and he is deconditioned. Otherwise low risk. He needs to lose weight and exercise more. GT

## 2016-10-09 NOTE — Telephone Encounter (Signed)
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   Jim Hill,M.D.

## 2016-10-09 NOTE — Telephone Encounter (Signed)
LMTCB

## 2016-10-10 ENCOUNTER — Encounter: Payer: Self-pay | Admitting: Internal Medicine

## 2016-10-10 NOTE — Telephone Encounter (Signed)
Called patient to let him know letter will be at front desk for him to pick up.
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
## 2016-10-17 NOTE — Telephone Encounter (Signed)
Got it. I hope they can figure out why he keeps passing out. GT

## 2016-10-26 ENCOUNTER — Ambulatory Visit: Payer: Self-pay | Admitting: Internal Medicine

## 2017-01-10 ENCOUNTER — Encounter: Payer: Self-pay | Admitting: Internal Medicine

## 2017-01-28 ENCOUNTER — Encounter: Payer: Self-pay | Admitting: Internal Medicine

## 2017-02-12 ENCOUNTER — Ambulatory Visit (INDEPENDENT_AMBULATORY_CARE_PROVIDER_SITE_OTHER): Payer: Self-pay | Admitting: Internal Medicine

## 2017-02-12 ENCOUNTER — Encounter: Payer: Self-pay | Admitting: Internal Medicine

## 2017-02-12 VITALS — BP 134/82 | HR 73 | Ht 71.0 in | Wt 310.0 lb

## 2017-02-12 DIAGNOSIS — I495 Sick sinus syndrome: Secondary | ICD-10-CM

## 2017-02-12 NOTE — Patient Instructions (Addendum)
Medication Instructions:  Your physician recommends that you continue on your current medications as directed. Please refer to the Current Medication list given to you today.   Labwork: None ordered.  Testing/Procedures: None ordered.   Follow-Up: Your physician wants you to follow-up in: 6 months with Dr. Ladona Ridgelaylor.   You will receive a reminder letter in the mail two months in advance. If you don't receive a letter, please call our office to schedule the follow-up appointment.  Remote monitoring is used to monitor your Pacemaker from home. This monitoring reduces the number of office visits required to check your device to one time per year. It allows us to keep an eye on the functioning of your device to ensure it is working properly. You are scheduled for a device check from home on 05/14/2017. You may send your transmission at any time that day. If you have a wireless device, the transmission will be sent automatically. After your physician reviews your transmission, you will receive a postcard with your next transmission date.    Any Other Special Instructions Will Be Listed Below (If Applicable).  Please wear your CPAP.   If you need a refill on your cardiac medications before your next appointment, please call your pharmacy.

## 2017-02-12 NOTE — Progress Notes (Signed)
HPI [name] returns today for follow-up. He is a very pleasant 46 year old man with a history of symptomatic bradycardia (?autonomic dysfunction), status [place] pac[place] in [place]. I suspect he has autonomic dysfunction but this is not well documented. He underwent generator change in 2013. He has not gained but has not lost any weight in the past year. He admits to dietary indiscretion. He has no CAD by cath. His EF is normal.  Allergies  Allergen Reactions  . Lisinopril-Hydrochlorothiazide Shortness Of Breath    Felt like he was going to have a MI  . Poractant Alfa Other (See Comments), Hives and Nausea And Vomiting  . Lactase Nausea And Vomiting  . Lactose Intolerance (Gi) Nausea And Vomiting  . Lactulose Nausea And Vomiting  . Pork-Derived Products Hives, Nausea And Vomiting and Other (See Comments)     headache     Current Outpatient Prescriptions  Medication Sig Dispense Refill  . aspirin 81 MG chewable tablet Chew 81 mg by mouth daily.    . cyclobenzaprine (FLEXERIL) 5 MG tablet Take 1 tablet (5 mg total) by mouth at bedtime as needed for muscle spasms (Do not take when driving). 30 tablet 3  . hydrocortisone (ANUSOL-HC) 2.5 % rectal cream Place 1 application rectally 2 (two) times daily. Do x 1 week then as needed 30 g 1  . metoCLOPramide (REGLAN) 10 MG tablet Take 1 tablet (10 mg total) by mouth 3 (three) times daily before meals. 90 tablet 2  . metoprolol (LOPRESSOR) 50 MG tablet May take 1 tablet as needed for palpitations. May take up to 4 tablets a day. 60 tablet 2  . potassium chloride SA (K-DUR,KLOR-CON) 20 MEQ tablet Take 1 tablet (20 mEq total) by mouth daily as needed (potassium supplement). 30 tablet 3  . Probiotic Product (PROBIOTIC PO) Take 1 tablet by mouth every other day.    . ranitidine (ZANTAC) 150 MG tablet Take 150 mg by mouth daily.    Marland Kitchen. triamterene-hydrochlorothiazide (MAXZIDE-25) 37.5-25 MG tablet TAKE  ONE TABLET BY MOUTH ONCE DAILY 30 tablet 9  . erythromycin (E-MYCIN) 250 MG tablet Three times a day ac one week out of the month (use as directed)     No current facility-administered medications for this visit.      Past Medical History:  Diagnosis Date  . Barrett's esophagus without dysplasia - subcentimeter changes 06/19/2015  . Chest pain    normal LHC 2008;  ETT-Echo 8/10: normal  . 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 Diastolic heart failure (HCC)   . Gastroparesis 08/19/2015  . GERD (gastroesophageal reflux disease)   . Hypertension   . Obesity   . OSA (obstructive sleep apnea)   . Other specified cardiac dysrhythmias(427.89)   . Sleep apnea   . SVT (supraventricular tachycardia) (HCC)   . Symptomatic bradycardia    [place]acer in 2006 in [place]    ROS:   All systems reviewed and negative except as noted in the HPI.   Past Surgical History:  Procedure Laterality Date  . APPENDECTOMY  1994  . COLONOSCOPY    . INSERT / REPLACE / REMOVE PACEMAKER     status post pacemaker implant August 2006  . PERMANENT PACEMAKER GENERATOR CHANGE N/A 04/16/2012   Procedure: PERMANENT PACEMAKER GENERATOR CHANGE;  Surgeon: Marinus MawGregg W Taysia Rivere, MD;  [facility];  Service: Cardiovascular;  Laterality: N/A;  . UPPER GASTROINTESTINAL ENDOSCOPY       Family History  Problem Relation Age of Onset  . Hypertension Maternal Grandmother   . Hypertension Maternal Grandfather   . Hypertension Paternal Grandmother   . Heart disease Paternal Grandfather   . Hypertension Paternal Grandfather   . Prostate cancer Paternal Grandfather   . Healthy Mother   . Healthy Father   . Hypertension Other   . Diabetes Other   . Cancer Other   . CAD Other   . Pancreatic cancer Paternal Uncle      Social History   Social History  . Marital status: Single    Spouse name: N/A  . Number of children: N/A  . Years of education: N/A   Occupational History  . truck driver    Social History  Main Topics  . Smoking status: Never Smoker  . Smokeless tobacco: Never Used  . Alcohol use 0.0 oz/week     Comment: occasional on the weekends (2-3 drinks, liquor and beer)  . Drug use: No  . Sexual activity: Yes   Other Topics Concern  . Not on file   Social History Narrative   Tobacco history none. He does admit to using recreational drugs in the past. He has not smoked marijuana in 7 years.    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 Both of his parents are in there 50 s and are  in good health.  Lives alone in a one story home.  Has 6 children.     Education: college.     BP 134/82   Pulse 73   Ht 5\' 11"  (1.803 m)   Wt (!) 310 lb (140.6 kg)   BMI 43.24 kg/m   Physical Exam:  Obese appearing 46 year old man, NAD HEENT: Unremarkable Neck:  6 cm JVD, no thyromegally Back:  No CVA tenderness Lungs:  Clear, with no wheezes, rales, or rhonchi. HEART:  Regular rate rhythm, no murmurs, no rubs, no clicks Abd:  soft, positive bowel sounds, no organomegally, no rebound, no guarding Ext:  2 plus pulses, no edema, no cyanosis, no clubbing Skin:  No rashes no nodules Neuro:  CN II through XII intact, motor grossly intact  DEVICE  Normal device function.  See PaceArt for details. He is programmed VVI at 30.  Assess/Plan: 1. 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 Will recheck in several months. His symptoms are resolved. He is pacing less than 0.1% of the time. 2. HTN - his blood pressure is better today. I have encouraged him to lose weight and not to miss his meds. 3. Obesity - I discussed dietary changes. He is trying to lose weight and I have encouraged him to try harder. 4. Palpitations - he has sinus tachycardia but could have SVT. I suspect the former.  Leonia Reeves.D.

## 2017-03-14 ENCOUNTER — Other Ambulatory Visit: Payer: Self-pay | Admitting: Internal Medicine

## 2017-03-23 ENCOUNTER — Other Ambulatory Visit: Payer: Self-pay | Admitting: Internal Medicine

## 2017-03-26 NOTE — Telephone Encounter (Signed)
Medication Detail    Disp Refills Start End   metoprolol tartrate (LOPRESSOR) 50 MG tablet 180 tablet 3 03/14/2017    Sig: MAY TAKE 1 TABLET BY MOUTH AS NEEDED FOR PALPITATIONS. MAY TAKE UP TO 4 TABLETS A DAY.   Sent to pharmacy as: metoprolol tartrate (LOPRESSOR) 50 MG tablet   Notes to Pharmacy: Please consider 90 day supplies to promote better adherence   E-Prescribing Status: Receipt confirmed by pharmacy (03/14/2017 11:46 AM EDT)   Pharmacy   [facility] - [place], [place] - [address]

## 2017-03-29 ENCOUNTER — Other Ambulatory Visit: Payer: Self-pay

## 2017-03-29 MED ORDER — METOPROLOL TARTRATE 50 MG PO TABS: 50.0000 mg | ORAL_TABLET | Freq: Four times a day (QID) | ORAL | 1 refills | Status: DC | PRN

## 2017-04-09 ENCOUNTER — Other Ambulatory Visit: Payer: Self-pay | Admitting: Internal Medicine

## 2017-04-09 MED ORDER — METOPROLOL TARTRATE 50 MG PO TABS: 50.0000 mg | ORAL_TABLET | Freq: Four times a day (QID) | ORAL | 1 refills | Status: DC | PRN

## 2017-04-09 NOTE — Telephone Encounter (Signed)
Medication Detail    Disp Refills Start End   metoprolol tartrate (LOPRESSOR) 50 MG tablet 360 tablet 1 04/09/2017    Sig - Route: Take 1 tablet (50 mg total) by mouth 4 (four) times daily as needed (For palpitations). - Oral   Sent to pharmacy as: metoprolol tartrate (LOPRESSOR) 50 MG tablet   Notes to Pharmacy: Please consider 90 day supplies to promote better adherence   E-Prescribing Status: Receipt confirmed by pharmacy (04/09/2017 11:28 AM EDT)   Pharmacy   [facility] - [place], [place] - [address]

## 2017-04-09 NOTE — Addendum Note (Signed)
Addended by: Demetrios Loll on: 04/09/2017 11:28 AM   Modules accepted: Orders

## 2017-05-04 IMAGING — CR DG CHEST 2V
1 series · 2 of 2 positions shown · non-contrast
Comparison: 11/10/2014

CLINICAL DATA: Left chest pain for 3 days. Pain radiates to the
left shoulder and left arm. Nausea and lightheadedness.

EXAM:
CHEST  2 VIEW

[Series 1: w chest pa · 0.14mm/px · 2 of 2 slices shown]
[im 1/2]
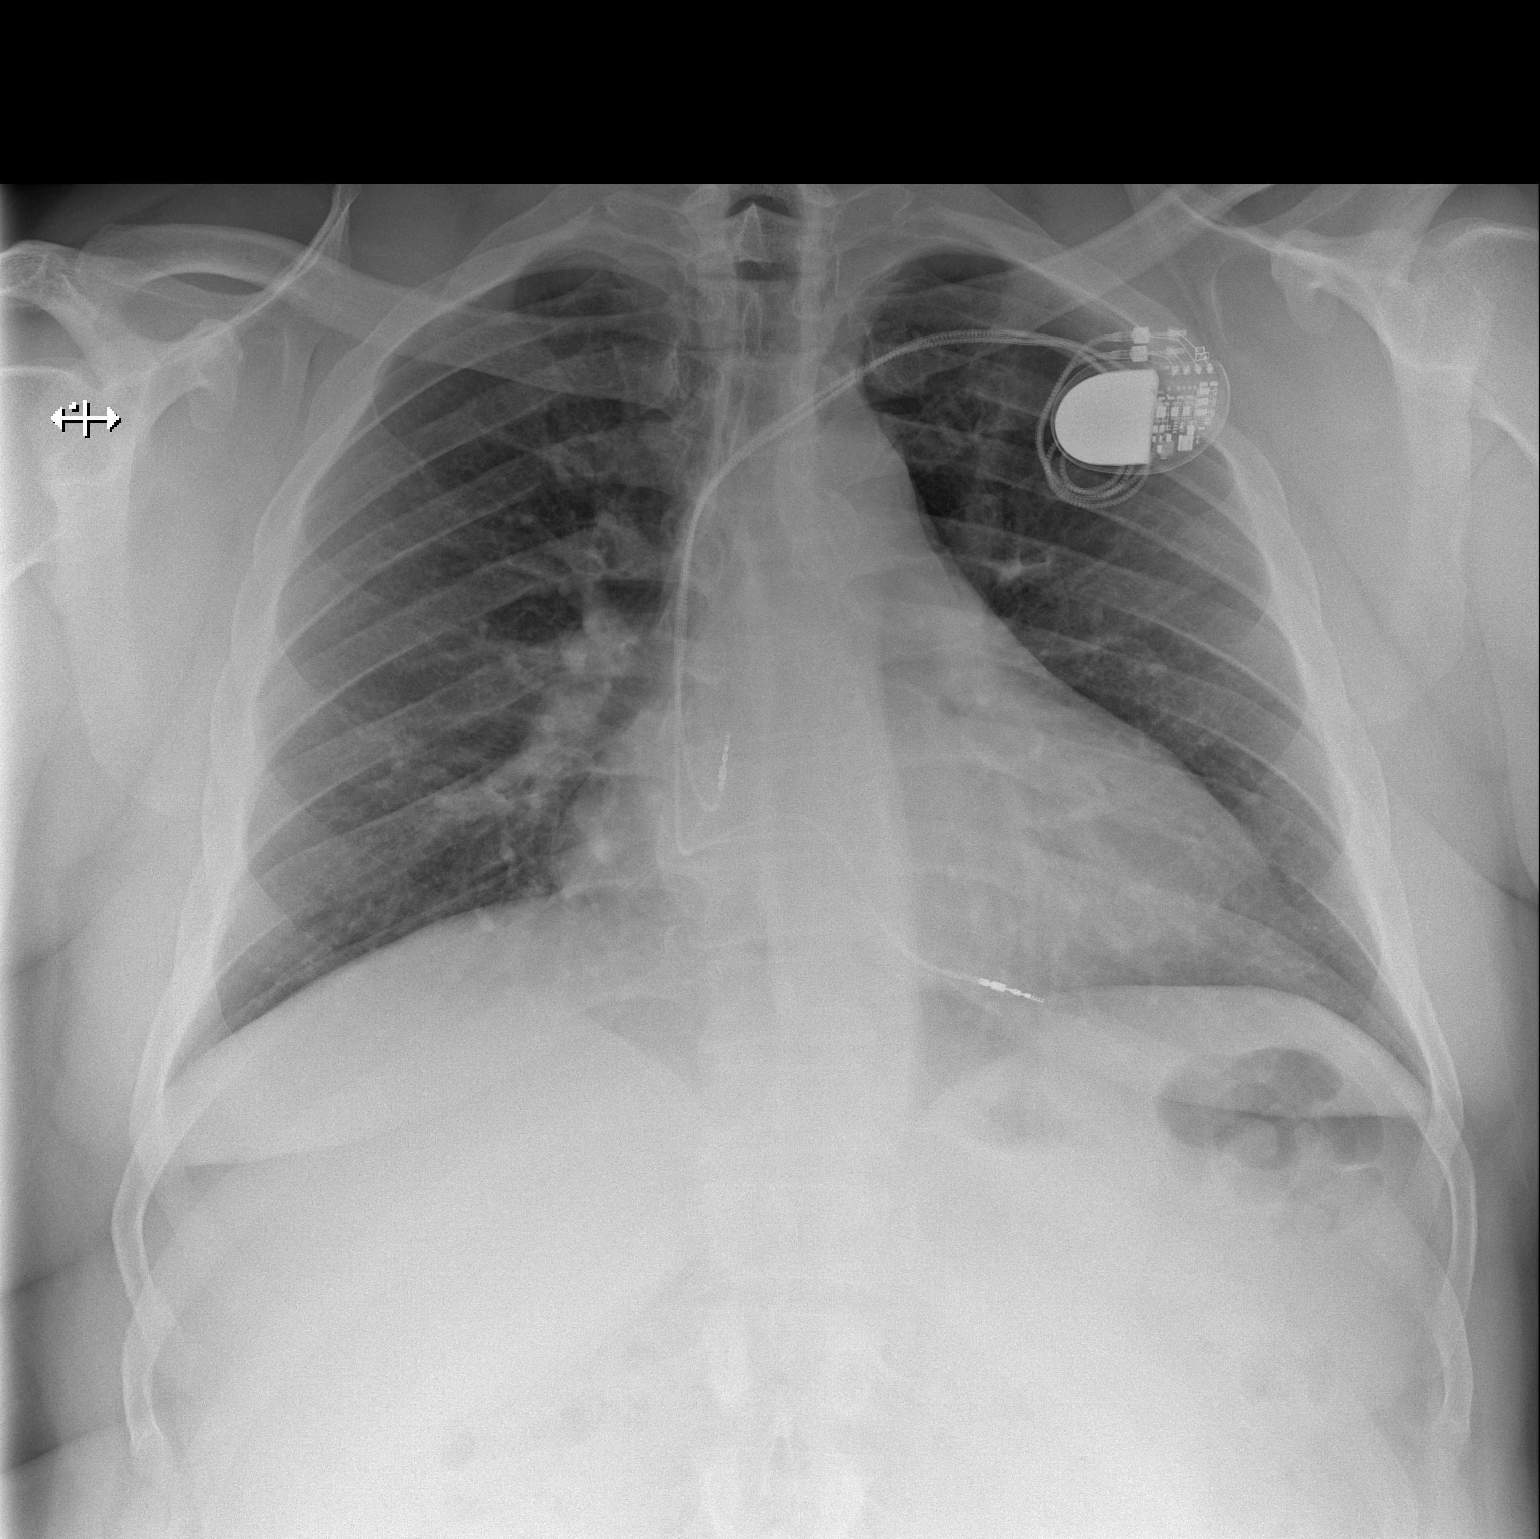
[im 2/2]
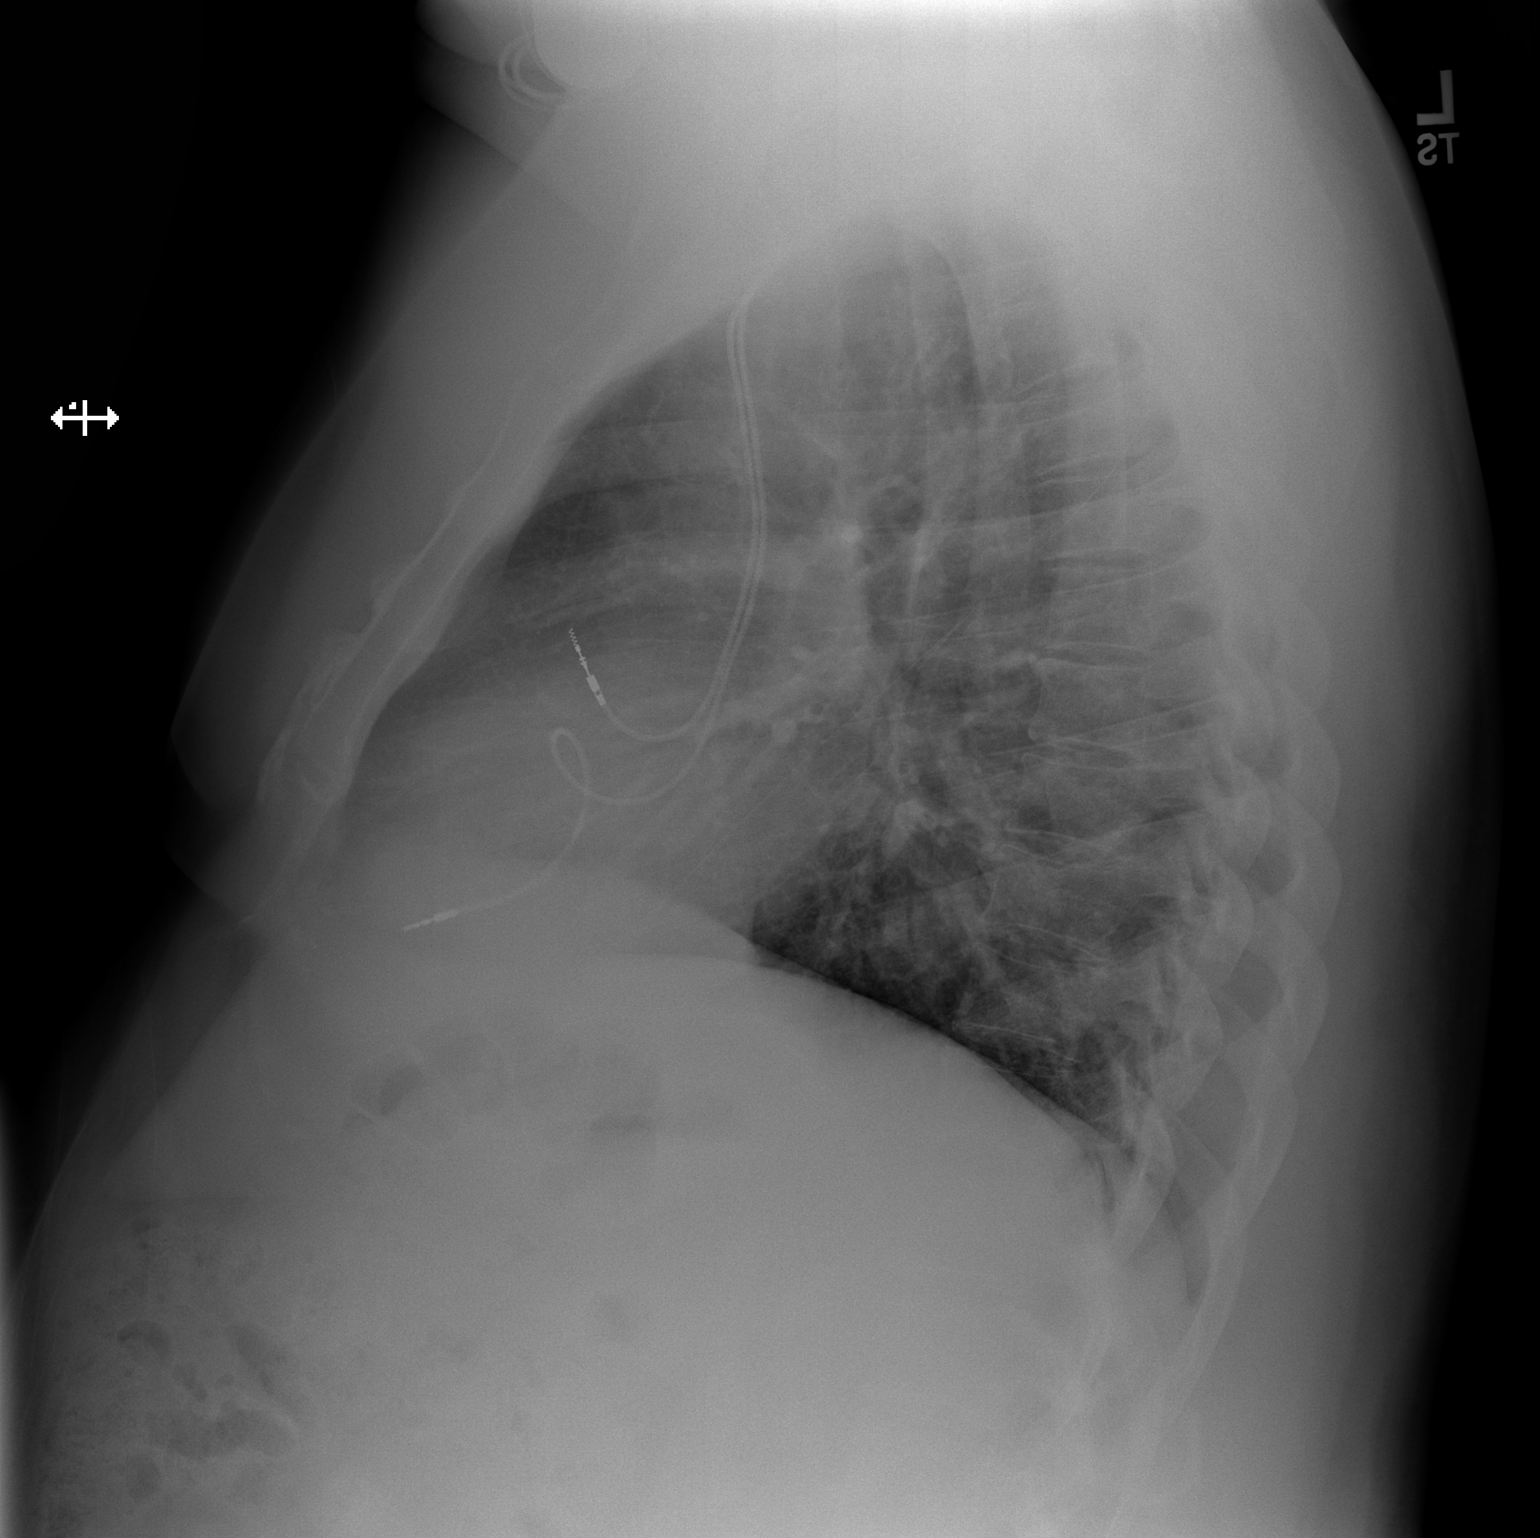

[2 of 2 positions shown; findings below may reference images not displayed]

FINDINGS: The heart size and mediastinal contours are within normal limits.
Both lungs are clear. The visualized skeletal structures are
unremarkable.
IMPRESSION: No active cardiopulmonary disease.

## 2017-05-14 ENCOUNTER — Telehealth: Payer: Self-pay | Admitting: Internal Medicine

## 2017-05-14 ENCOUNTER — Encounter: Payer: Self-pay | Admitting: *Deleted

## 2017-05-14 NOTE — Telephone Encounter (Signed)
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 Pt advised I will forward to Dr Ladona Ridgelaylor for review, will follow-up with him once Dr Ladona Ridgelaylor has reviewed.

## 2017-05-14 NOTE — Telephone Encounter (Signed)
New message    Patient unable to get device app to work. Unable to determine if transmission going through. Wants to know when to schedule next remote check.  Please call patient at [phone]   1. Has your device fired?  No    2. Is you device beeping? no  3. Are you experiencing draining or swelling at device site? no  4. Are you calling to see if we received your device transmission? yes  5. Have you passed out? no    Please route to Device Clinic Pool

## 2017-05-14 NOTE — Telephone Encounter (Signed)
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 Please call [phone]

## 2017-05-15 NOTE — Telephone Encounter (Signed)
LM with Joy for patient to call back.   Transmission from yesterday was not received.

## 2017-05-15 NOTE — Telephone Encounter (Signed)
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   Leonia ReevesGregg Taylor,M.D

## 2017-05-16 ENCOUNTER — Encounter: Payer: Self-pay | Admitting: Cardiology

## 2017-05-20 NOTE — Telephone Encounter (Signed)
Left detailed message per DPR letting Pt know letter for his work will be at front desk.  Left this nurse name and # if any questions.

## 2017-05-29 NOTE — Telephone Encounter (Addendum)
Spoke with Joy.  She reports that the patient is not available right now and she does not have a number to reach him.  (DPR on file does not allow disclosure of any PHI to anyone other than patient.)  I requested a call back from the patient, left the Device Clinic phone number.  Joy states she will have the patient call back.

## 2017-06-05 NOTE — Telephone Encounter (Signed)
Patient called and stated that he can not get the app to work on his phone. He is going to download the My [facility] app for the [facility] smart monitor on another phone. If this does not work pt is going to call tech support for assistance trouble shooting the monitor.

## 2017-06-05 NOTE — Telephone Encounter (Signed)
Spoke with Ander SladeJoy she stated that pt would call back around 4:30pm regarding transmission and help sending.

## 2017-06-07 ENCOUNTER — Ambulatory Visit (INDEPENDENT_AMBULATORY_CARE_PROVIDER_SITE_OTHER): Payer: Self-pay | Admitting: *Deleted

## 2017-06-07 DIAGNOSIS — I495 Sick sinus syndrome: Secondary | ICD-10-CM

## 2017-06-10 ENCOUNTER — Telehealth: Payer: Self-pay | Admitting: Internal Medicine

## 2017-06-10 NOTE — Telephone Encounter (Signed)
New Message  ° ° ° ° ° °1. Has your device fired? no ° °2. Is you device beeping? no ° °3. Are you experiencing draining or swelling at device site?  no ° °4. Are you calling to see if we received your device transmission? Yes  ° °5. Have you passed out? no ° ° ° °Please route to Device Clinic Pool ° °

## 2017-06-10 NOTE — Telephone Encounter (Signed)
Called pt back~ no answer, transmission was received.

## 2017-06-12 ENCOUNTER — Encounter: Payer: Self-pay | Admitting: Cardiology

## 2017-06-12 NOTE — Progress Notes (Signed)
Remote pacemaker transmission.   

## 2017-06-24 LAB — CUP PACEART REMOTE DEVICE CHECK
Implantable Lead Implant Date: 20060823
Implantable Lead Location: 753859
Implantable Lead Model: 5076
Implantable Pulse Generator Implant Date: 20130925
Lead Channel Impedance Value: 668 Ohm
Lead Channel Impedance Value: 67 Ohm
Lead Channel Pacing Threshold Pulse Width: 0.4 ms
MDC IDC LEAD IMPLANT DT: 20060823
MDC IDC LEAD LOCATION: 753860
MDC IDC MSMT BATTERY IMPEDANCE: 544 Ohm
MDC IDC MSMT BATTERY REMAINING LONGEVITY: 94 mo
MDC IDC MSMT BATTERY VOLTAGE: 2.78 V
MDC IDC MSMT LEADCHNL RV PACING THRESHOLD AMPLITUDE: 1.125 V
MDC IDC SESS DTM: 20181116194916
MDC IDC SET LEADCHNL RV PACING AMPLITUDE: 2.5 V
MDC IDC SET LEADCHNL RV PACING PULSEWIDTH: 0.4 ms
MDC IDC SET LEADCHNL RV SENSING SENSITIVITY: 2 mV
MDC IDC STAT BRADY RV PERCENT PACED: 0 %

## 2017-07-17 DIAGNOSIS — E669 Obesity, unspecified: Secondary | ICD-10-CM | POA: Insufficient documentation

## 2017-07-17 DIAGNOSIS — G4730 Sleep apnea, unspecified: Secondary | ICD-10-CM | POA: Insufficient documentation

## 2017-07-17 DIAGNOSIS — I5030 Unspecified diastolic (congestive) heart failure: Secondary | ICD-10-CM | POA: Insufficient documentation

## 2017-07-17 DIAGNOSIS — R001 Bradycardia, unspecified: Secondary | ICD-10-CM | POA: Insufficient documentation

## 2017-08-06 ENCOUNTER — Ambulatory Visit (INDEPENDENT_AMBULATORY_CARE_PROVIDER_SITE_OTHER): Payer: Self-pay | Admitting: Internal Medicine

## 2017-08-06 ENCOUNTER — Encounter: Payer: Self-pay | Admitting: Internal Medicine

## 2017-08-06 VITALS — BP 124/82 | HR 61 | Ht 71.0 in | Wt 315.0 lb

## 2017-08-06 DIAGNOSIS — Z950 Presence of cardiac pacemaker: Secondary | ICD-10-CM

## 2017-08-06 DIAGNOSIS — I495 Sick sinus syndrome: Secondary | ICD-10-CM

## 2017-08-06 LAB — CUP PACEART INCLINIC DEVICE CHECK
Battery Impedance: 2179 Ohm
Battery Remaining Longevity: 27 mo
Brady Statistic AP VP Percent: 2 %
Brady Statistic AS VP Percent: 98 %
Brady Statistic AS VS Percent: 0 %
Date Time Interrogation Session: 20190115130828
Implantable Lead Implant Date: 20060823
Implantable Lead Location: 753859
Implantable Lead Location: 753860
Implantable Lead Model: 5076
Implantable Lead Model: 5076
Implantable Pulse Generator Implant Date: 20130925
Lead Channel Pacing Threshold Amplitude: 0.875 V
Lead Channel Pacing Threshold Amplitude: 1 V
Lead Channel Pacing Threshold Pulse Width: 0.4 ms
Lead Channel Pacing Threshold Pulse Width: 0.4 ms
Lead Channel Sensing Intrinsic Amplitude: 4 mV
MDC IDC LEAD IMPLANT DT: 20060823
MDC IDC MSMT BATTERY VOLTAGE: 2.74 V
MDC IDC MSMT LEADCHNL RA IMPEDANCE VALUE: 400 Ohm
MDC IDC MSMT LEADCHNL RV IMPEDANCE VALUE: 500 Ohm
MDC IDC MSMT LEADCHNL RV PACING THRESHOLD AMPLITUDE: 0.75 V
MDC IDC MSMT LEADCHNL RV PACING THRESHOLD AMPLITUDE: 0.875 V
MDC IDC MSMT LEADCHNL RV PACING THRESHOLD PULSEWIDTH: 0.4 ms
MDC IDC MSMT LEADCHNL RV PACING THRESHOLD PULSEWIDTH: 0.4 ms
MDC IDC SET LEADCHNL RA PACING AMPLITUDE: 2 V
MDC IDC SET LEADCHNL RV PACING AMPLITUDE: 2.5 V
MDC IDC SET LEADCHNL RV PACING PULSEWIDTH: 0.4 ms
MDC IDC SET LEADCHNL RV SENSING SENSITIVITY: 4 mV
MDC IDC STAT BRADY AP VS PERCENT: 0 %

## 2017-08-06 NOTE — Progress Notes (Signed)
HPI [name] returns today for ongoing evaluation chest pain and palpitations. He has a h/o dietary and medical non-compliance. 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 He admits to eating too many sweet foods and drinks. He has not had syncope and his palpitations are well controlled.   Allergies  Allergen Reactions  . Lisinopril-Hydrochlorothiazide Shortness Of Breath    Felt like he was going to have a MI  . Poractant Alfa Other (See Comments), Hives and Nausea And Vomiting  . Lactase Nausea And Vomiting  . Lactose Intolerance (Gi) Nausea And Vomiting  . Lactulose Nausea And Vomiting  . Pork-Derived Products Hives, Nausea And Vomiting and Other (See Comments)     headache     Current Outpatient Medications  Medication Sig Dispense Refill  . aspirin 81 MG chewable tablet Chew 81 mg by mouth daily.    . cyclobenzaprine (FLEXERIL) 5 MG tablet Take 1 tablet (5 mg total) by mouth at bedtime as needed for muscle spasms (Do not take when driving). 30 tablet 3  . metoprolol tartrate (LOPRESSOR) 50 MG tablet Take 1 tablet (50 mg total) by mouth 4 (four) times daily as needed (For palpitations). 360 tablet 1  . Probiotic Product (PROBIOTIC PO) Take 1 tablet by mouth every other day.    . ranitidine (ZANTAC) 150 MG tablet Take 150 mg by mouth daily.    Marland Kitchen. triamterene-hydrochlorothiazide (MAXZIDE-25) 37.5-25 MG tablet TAKE ONE TABLET BY MOUTH ONCE DAILY 30 tablet 9   No current facility-administered medications for this visit.      Past Medical History:  Diagnosis Date  . Barrett's esophagus without dysplasia - subcentimeter changes 06/19/2015  . Chest pain    normal LHC 2008;  ETT-Echo 8/10: normal  . 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 Diastolic heart failure (HCC)   . Gastroparesis 08/19/2015  . GERD (gastroesophageal reflux disease)   . Hypertension   . Obesity   . OSA (obstructive sleep apnea)   . Other specified cardiac dysrhythmias(427.89)   .  Sleep apnea   . SVT (supraventricular tachycardia) (HCC)   . Symptomatic bradycardia    [place]acer in 2006 in [place]    ROS:   All systems reviewed and negative except as noted in the HPI.   Past Surgical History:  Procedure Laterality Date  . APPENDECTOMY  1994  . COLONOSCOPY    . INSERT / REPLACE / REMOVE PACEMAKER     status post pacemaker implant August 2006  . PERMANENT PACEMAKER GENERATOR CHANGE N/A 04/16/2012   Procedure: PERMANENT PACEMAKER GENERATOR CHANGE;  Surgeon: Marinus MawGregg W Rohin Krejci, MD;  [facility];  Service: Cardiovascular;  Laterality: N/A;  . UPPER GASTROINTESTINAL ENDOSCOPY       Family History  Problem Relation Age of Onset  . Hypertension Maternal Grandmother   . Hypertension Maternal Grandfather   . Hypertension Paternal Grandmother   . Heart disease Paternal Grandfather   . Hypertension Paternal Grandfather   . Prostate cancer Paternal Grandfather   . Healthy Mother   . Healthy Father   . Hypertension Other   . Diabetes Other   . Cancer Other   . CAD Other   . Pancreatic cancer Paternal Uncle      Social History   Socioeconomic History  . Marital status: Single    Spouse name: Not on file  . Number of children: Not on file  . Years of education: Not on file  . Highest education  level: Not on file  Social Needs  . Financial resource strain: Not on file  . Food insecurity - worry: Not on file  . Food insecurity - inability: Not on file  . Transportation needs - medical: Not on file  . Transportation needs - non-medical: Not on file  Occupational History  . Occupation: truck Hospital doctor  Tobacco Use  . Smoking status: Never Smoker  . Smokeless tobacco: Never Used  Substance and Sexual Activity  . Alcohol use: Yes    Alcohol/week: 0.0 oz    Comment: occasional on the weekends (2-3 drinks, liquor and beer)  . Drug use: No  . Sexual activity: Yes  Other Topics Concern  . Not on file  Social History Narrative   Tobacco history none.  He does admit to using recreational drugs in the past. He has not smoked marijuana in 7 years.    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 Both of his parents are in there 50 s and are  in good health.  Lives alone in a one story home.  Has 6 children.     Education: college.     BP 124/82   Pulse 61   Ht 5\' 11"  (1.803 m)   Wt (!) 315 lb (142.9 kg)   BMI 43.93 kg/m   Physical Exam:  Well appearing 47 yo man, NAD HEENT: Unremarkable Neck:  6 cm JVD, no thyromegally Lymphatics:  No adenopathy Back:  No CVA tenderness Lungs:  Clear with no wheezes HEART:  Regular rate rhythm, no murmurs, no rubs, no clicks Abd:  soft, positive bowel sounds, no organomegally, no rebound, no guarding Ext:  2 plus pulses, no edema, no cyanosis, no clubbing Skin:  No rashes no nodules Neuro:  CN II through XII intact, motor grossly intact  EKG - NSR  DEVICE  Normal device function.  See PaceArt for details. He is programmed VVI 30 and is not pacing  Assess/Plan: 1. Sinus node dysfunction - he is not using his PPM which is programmed to a lower rate of 30/min. 2. PPM - his interogation demonstrates normal function. 3. Obesity - we discussed dietary changes that he needs to make and he states that he will do better. 4. HTN - his blood pressure is well controlled. Will follow.  Leonia Reeves.D.

## 2017-08-06 NOTE — Patient Instructions (Addendum)
Medication Instructions:  Your physician recommends that you continue on your current medications as directed. Please refer to the Current Medication list given to you today.  Labwork: None ordered.  Testing/Procedures: None ordered.  Follow-Up: Your physician wants you to follow-up in: six months with Dr Ladona Ridgelaylor. You will receive a reminder letter in the mail two months in advance. If you don't receive a letter, please call our office to schedule the follow-up appointment.  Remote monitoring is used to monitor your Pacemaker from home. This monitoring reduces the number of office visits required to check your device to one time per year. It allows us to keep an eye on the functioning of your device to ensure it is working properly. You are scheduled for a device check from home on 09/09/2017. You may send your transmission at any time that day. If you have a wireless device, the transmission will be sent automatically. After your physician reviews your transmission, you will receive a postcard with your next transmission date.   Any Other Special Instructions Will Be Listed Below (If Applicable).     If you need a refill on your cardiac medications before your next appointment, please call your pharmacy.

## 2017-08-19 ENCOUNTER — Other Ambulatory Visit: Payer: Self-pay | Admitting: Internal Medicine

## 2017-09-09 ENCOUNTER — Encounter: Payer: Self-pay | Admitting: *Deleted

## 2017-09-09 ENCOUNTER — Telehealth: Payer: Self-pay | Admitting: Cardiology

## 2017-09-09 NOTE — Telephone Encounter (Signed)
Attempted to confirm remote transmission with pt. No answer and was unable to leave a message.   

## 2017-09-12 ENCOUNTER — Encounter: Payer: Self-pay | Admitting: Cardiology

## 2017-09-17 ENCOUNTER — Ambulatory Visit (INDEPENDENT_AMBULATORY_CARE_PROVIDER_SITE_OTHER): Payer: Self-pay | Admitting: *Deleted

## 2017-09-17 DIAGNOSIS — I495 Sick sinus syndrome: Secondary | ICD-10-CM

## 2017-09-17 DIAGNOSIS — Z950 Presence of cardiac pacemaker: Secondary | ICD-10-CM

## 2017-09-24 ENCOUNTER — Telehealth: Payer: Self-pay | Admitting: Internal Medicine

## 2017-09-24 NOTE — Progress Notes (Signed)
Remote pacemaker check. 

## 2017-09-24 NOTE — Telephone Encounter (Signed)
New Message ° ° ° °1. Has your device fired? no ° °2. Is you device beeping? no ° °3. Are you experiencing draining or swelling at device site? no ° °4. Are you calling to see if we received your device transmission? yes ° °5. Have you passed out? no ° ° ° °Please route to Device Clinic Pool ° °

## 2017-09-24 NOTE — Telephone Encounter (Signed)
Transmission received. Normal device function. Clarified patient's lopressor dosage and follow-up.

## 2017-10-04 LAB — CUP PACEART REMOTE DEVICE CHECK
Implantable Lead Implant Date: 20060823
Implantable Lead Location: 753859
Implantable Lead Model: 5076
Lead Channel Setting Pacing Amplitude: 2.5 V
Lead Channel Setting Pacing Pulse Width: 0.4 ms
MDC IDC LEAD IMPLANT DT: 20060823
MDC IDC LEAD LOCATION: 753860
MDC IDC PG IMPLANT DT: 20130925
MDC IDC SESS DTM: 20190315090952
MDC IDC SET LEADCHNL RA PACING AMPLITUDE: 2 V
MDC IDC SET LEADCHNL RV SENSING SENSITIVITY: 4 mV

## 2017-11-22 ENCOUNTER — Telehealth: Payer: Self-pay | Admitting: Internal Medicine

## 2017-11-22 NOTE — Telephone Encounter (Signed)
New Message:       Pt states he is having soreness near his pacemaker site for the last 3wks around his areola.

## 2017-11-22 NOTE — Telephone Encounter (Signed)
Reviewed remote transmission with patient. Recommended that patient call PCP for breast soreness. OV with GT made per recall for July 17th. Patient agreeable to this.

## 2017-11-22 NOTE — Telephone Encounter (Addendum)
Spoke with patient who reports soreness for approx 3 weeks located in his left "breast area". Patient denies any redness, swelling or tenderness at the pacemaker site. Patient reported arm and shoulder pain recently as well which lead him to the ED - he reports that they stated it was not cardiac in nature. Patient will send a remote transmission for review.

## 2017-12-12 ENCOUNTER — Encounter: Payer: Self-pay | Admitting: Internal Medicine

## 2017-12-23 ENCOUNTER — Telehealth: Payer: Self-pay | Admitting: Cardiology

## 2017-12-23 ENCOUNTER — Ambulatory Visit (INDEPENDENT_AMBULATORY_CARE_PROVIDER_SITE_OTHER): Payer: BLUE CROSS/BLUE SHIELD | Admitting: *Deleted

## 2017-12-23 DIAGNOSIS — I495 Sick sinus syndrome: Secondary | ICD-10-CM

## 2017-12-23 NOTE — Telephone Encounter (Signed)
LMOVM reminding pt to send remote transmission.   

## 2017-12-24 ENCOUNTER — Telehealth: Payer: Self-pay | Admitting: Internal Medicine

## 2017-12-24 NOTE — Telephone Encounter (Signed)
New Message    Pt c/o of Chest Pain: STAT if CP now or developed within 24 hours  1. Are you having CP right now? no  2. Are you experiencing any other symptoms (ex. SOB, nausea, vomiting, sweating)? Nausea   3. How long have you been experiencing CP? 2-3 weeks 4. Is your CP continuous or coming and going? Coming and going   5. Have you taken Nitroglycerin? No  ?

## 2017-12-24 NOTE — Telephone Encounter (Signed)
Returned call to Pt.  Pt has had left breast pain.  Wants to know if anything can be done.

## 2017-12-25 ENCOUNTER — Encounter: Payer: Self-pay | Admitting: Cardiology

## 2017-12-25 NOTE — Progress Notes (Signed)
Remote pacemaker transmission.   

## 2017-12-26 NOTE — Telephone Encounter (Signed)
Returned call to Pt.  Advised Pt that per Dr. Lenon Omsaylor-no meds to change.  Speak to medical MD-GT. Pt indicates understanding.  Pt has f/u with Dr. Ladona Ridgelaylor 01/10/2018-advised he could discuss further at that time. No further questions.

## 2017-12-31 ENCOUNTER — Encounter: Payer: Self-pay | Admitting: Internal Medicine

## 2018-01-09 LAB — CUP PACEART REMOTE DEVICE CHECK
Implantable Lead Implant Date: 20060823
Implantable Lead Location: 753860
Implantable Lead Model: 5076
Implantable Pulse Generator Implant Date: 20130925
Lead Channel Setting Sensing Sensitivity: 4 mV
MDC IDC LEAD IMPLANT DT: 20060823
MDC IDC LEAD LOCATION: 753859
MDC IDC SESS DTM: 20190620121412
MDC IDC SET LEADCHNL RA PACING AMPLITUDE: 2 V
MDC IDC SET LEADCHNL RV PACING AMPLITUDE: 2.5 V
MDC IDC SET LEADCHNL RV PACING PULSEWIDTH: 0.4 ms

## 2018-01-10 ENCOUNTER — Ambulatory Visit (INDEPENDENT_AMBULATORY_CARE_PROVIDER_SITE_OTHER): Payer: BLUE CROSS/BLUE SHIELD | Admitting: Internal Medicine

## 2018-01-10 VITALS — BP 118/70 | HR 77 | Ht 70.0 in | Wt 328.0 lb

## 2018-01-10 DIAGNOSIS — I10 Essential (primary) hypertension: Secondary | ICD-10-CM | POA: Diagnosis not present

## 2018-01-10 DIAGNOSIS — I495 Sick sinus syndrome: Secondary | ICD-10-CM

## 2018-01-10 DIAGNOSIS — Z950 Presence of cardiac pacemaker: Secondary | ICD-10-CM

## 2018-01-10 LAB — CUP PACEART INCLINIC DEVICE CHECK
Battery Impedance: 720 Ohm
Battery Remaining Longevity: 83 mo
Battery Voltage: 2.78 V
Brady Statistic RV Percent Paced: 0 %
Date Time Interrogation Session: 20190621164816
Implantable Lead Location: 753860
Lead Channel Pacing Threshold Pulse Width: 0.4 ms
Lead Channel Sensing Intrinsic Amplitude: 5.6 mV
Lead Channel Setting Pacing Amplitude: 2.25 V
Lead Channel Setting Pacing Pulse Width: 0.4 ms
MDC IDC LEAD IMPLANT DT: 20060823
MDC IDC LEAD IMPLANT DT: 20060823
MDC IDC LEAD LOCATION: 753859
MDC IDC MSMT LEADCHNL RA IMPEDANCE VALUE: 67 Ohm
MDC IDC MSMT LEADCHNL RV IMPEDANCE VALUE: 546 Ohm
MDC IDC MSMT LEADCHNL RV PACING THRESHOLD AMPLITUDE: 1 V
MDC IDC MSMT LEADCHNL RV PACING THRESHOLD AMPLITUDE: 1.125 V
MDC IDC MSMT LEADCHNL RV PACING THRESHOLD PULSEWIDTH: 0.4 ms
MDC IDC PG IMPLANT DT: 20130925
MDC IDC SET LEADCHNL RV SENSING SENSITIVITY: 2 mV

## 2018-01-10 NOTE — Patient Instructions (Signed)

## 2018-01-12 ENCOUNTER — Encounter: Payer: Self-pay | Admitting: Internal Medicine

## 2018-01-12 NOTE — Progress Notes (Signed)
HPI [name] returns today for followup. He is a morbidly obese middle aged man with a h/o palpitations and autonomic dysfunction who underwent PPM insertion years ago. He has a h/o SVT and underwent catheter ablation remotely. He has intermittent chest pain. He does not have CAD. He continues to gain weight. He had a stress echo several weeks ago when he presented to the ED in [place]. Negative.  Allergies  Allergen Reactions  . Lisinopril-Hydrochlorothiazide Shortness Of Breath    Felt like he was going to have a MI  . Poractant Alfa Other (See Comments), Hives and Nausea And Vomiting  . Lactase Nausea And Vomiting  . Lactose Intolerance (Gi) Nausea And Vomiting  . Lactulose Nausea And Vomiting  . Pork-Derived Products Hives, Nausea And Vomiting and Other (See Comments)     headache     Current Outpatient Medications  Medication Sig Dispense Refill  . aspirin 81 MG chewable tablet Chew 81 mg by mouth daily.    . cyclobenzaprine (FLEXERIL) 5 MG tablet Take 1 tablet (5 mg total) by mouth at bedtime as needed for muscle spasms (Do not take when driving). 30 tablet 3  . metoprolol tartrate (LOPRESSOR) 50 MG tablet Take 1 tablet (50 mg total) by mouth 4 (four) times daily as needed (For palpitations). 360 tablet 1  . Probiotic Product (PROBIOTIC PO) Take 1 tablet by mouth every other day.    . ranitidine (ZANTAC) 150 MG tablet Take 150 mg by mouth daily.    Marland Kitchen. triamterene-hydrochlorothiazide (MAXZIDE-25) 37.5-25 MG tablet TAKE ONE TABLET BY MOUTH ONCE DAILY 90 tablet 3   No current facility-administered medications for this visit.      Past Medical History:  Diagnosis Date  . Barrett's esophagus without dysplasia - subcentimeter changes 06/19/2015  . Chest pain    normal LHC 2008;  ETT-Echo 8/10: normal  . 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 Diastolic heart failure (HCC)   . Gastroparesis 08/19/2015  . GERD (gastroesophageal reflux disease)   . Hypertension   .  Obesity   . OSA (obstructive sleep apnea)   . Other specified cardiac dysrhythmias(427.89)   . Sleep apnea   . SVT (supraventricular tachycardia) (HCC)   . Symptomatic bradycardia    s[place]acer in 2006 in [place]    ROS:   All systems reviewed and negative except as noted in the HPI.   Past Surgical History:  Procedure Laterality Date  . APPENDECTOMY  1994  . COLONOSCOPY    . INSERT / REPLACE / REMOVE PACEMAKER     status post pacemaker implant August 2006  . PERMANENT PACEMAKER GENERATOR CHANGE N/A 04/16/2012   Procedure: PERMANENT PACEMAKER GENERATOR CHANGE;  Surgeon: Jim MawGregg W Malashia Kamaka, MD;  [facility];  Service: Cardiovascular;  Laterality: N/A;  . UPPER GASTROINTESTINAL ENDOSCOPY       Family History  Problem Relation Age of Onset  . Hypertension Maternal Grandmother   . Hypertension Maternal Grandfather   . Hypertension Paternal Grandmother   . Heart disease Paternal Grandfather   . Hypertension Paternal Grandfather   . Prostate cancer Paternal Grandfather   . Healthy Mother   . Healthy Father   . Hypertension Other   . Diabetes Other   . Cancer Other   . CAD Other   . Pancreatic cancer Paternal Uncle      Social History   Socioeconomic History  . Marital status: Single    Spouse name: Not on file  . Number  of children: Not on file  . Years of education: Not on file  . Highest education level: Not on file  Occupational History  . Occupation: truck Public librarian Needs  . Financial resource strain: Not on file  . Food insecurity:    Worry: Not on file    Inability: Not on file  . Transportation needs:    Medical: Not on file    Non-medical: Not on file  Tobacco Use  . Smoking status: Never Smoker  . Smokeless tobacco: Never Used  Substance and Sexual Activity  . Alcohol use: Yes    Alcohol/week: 0.0 oz    Comment: occasional on the weekends (2-3 drinks, liquor and beer)  . Drug use: No  . Sexual activity: Yes  Lifestyle  . Physical  activity:    Days per week: Not on file    Minutes per session: Not on file  . Stress: Not on file  Relationships  . Social connections:    Talks on phone: Not on file    Gets together: Not on file    Attends religious service: Not on file    Active member of club or organization: Not on file    Attends meetings of clubs or organizations: Not on file    Relationship status: Not on file  . Intimate partner violence:    Fear of current or ex partner: Not on file    Emotionally abused: Not on file    Physically abused: Not on file    Forced sexual activity: Not on file  Other Topics Concern  . Not on file  Social History Narrative   Tobacco history none. He does admit to using recreational drugs in the past. He has not smoked marijuana in 7 years.    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 Both of his parents are in there 50 s and are  in good health.  Lives alone in a one story home.  Has 6 children.     Education: college.     BP 118/70   Pulse 77   Ht 5\' 10"  (1.778 m)   Wt (!) 328 lb (148.8 kg)   SpO2 98%   BMI 47.06 kg/m   Physical Exam:  obese appearing 47 yo man, NAD HEENT: Unremarkable Neck:  6 cm JVD, no thyromegally Lymphatics:  No adenopathy Back:  No CVA tenderness Lungs:  Clear with no wheezes HEART:  Regular rate rhythm, no murmurs, no rubs, no clicks Abd:  soft, positive bowel sounds, no organomegally, no rebound, no guarding Ext:  2 plus pulses, no edema, no cyanosis, no clubbing Skin:  No rashes no nodules Neuro:  CN II through XII intact, motor grossly intact  EKG - NSR  DEVICE  Normal device function.  See PaceArt for details.   Assess/Plan: 1.

## 2018-01-12 NOTE — Progress Notes (Signed)
HPI  Allergies  Allergen Reactions  . Lisinopril-Hydrochlorothiazide Shortness Of Breath    Felt like he was going to have a MI  . Poractant Alfa Other (See Comments), Hives and Nausea And Vomiting  . Lactase Nausea And Vomiting  . Lactose Intolerance (Gi) Nausea And Vomiting  . Lactulose Nausea And Vomiting  . Pork-Derived Products Hives, Nausea And Vomiting and Other (See Comments)     headache     Current Outpatient Medications  Medication Sig Dispense Refill  . aspirin 81 MG chewable tablet Chew 81 mg by mouth daily.    . cyclobenzaprine (FLEXERIL) 5 MG tablet Take 1 tablet (5 mg total) by mouth at bedtime as needed for muscle spasms (Do not take when driving). 30 tablet 3  . metoprolol tartrate (LOPRESSOR) 50 MG tablet Take 1 tablet (50 mg total) by mouth 4 (four) times daily as needed (For palpitations). 360 tablet 1  . Probiotic Product (PROBIOTIC PO) Take 1 tablet by mouth every other day.    . ranitidine (ZANTAC) 150 MG tablet Take 150 mg by mouth daily.    Marland Kitchen triamterene-hydrochlorothiazide (MAXZIDE-25) 37.5-25 MG tablet TAKE ONE TABLET BY MOUTH ONCE DAILY 90 tablet 3   No current facility-administered medications for this visit.      Past Medical History:  Diagnosis Date  . Barrett's esophagus without dysplasia - subcentimeter changes 06/19/2015  . Chest pain    normal LHC 2008;  ETT-Echo 8/10: normal  . 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 Diastolic heart failure (HCC)   . Gastroparesis 08/19/2015  . GERD (gastroesophageal reflux disease)   . Hypertension   . Obesity   . OSA (obstructive sleep apnea)   . Other specified cardiac dysrhythmias(427.89)   . Sleep apnea   . SVT (supraventricular tachycardia) (HCC)   . Symptomatic bradycardia    s/p pacer in 2006 in [place]    ROS:   All systems reviewed and negative except as noted in the HPI.   Past Surgical History:  Procedure Laterality Date  . APPENDECTOMY  1994  . COLONOSCOPY    . INSERT /  REPLACE / REMOVE PACEMAKER     status post pacemaker implant August 2006  . PERMANENT PACEMAKER GENERATOR CHANGE N/A 04/16/2012   Procedure: PERMANENT PACEMAKER GENERATOR CHANGE;  Surgeon: Marinus Maw, MD;  Location: [facility] CATH LAB;  Service: Cardiovascular;  Laterality: N/A;  . UPPER GASTROINTESTINAL ENDOSCOPY       Family History  Problem Relation Age of Onset  . Hypertension Maternal Grandmother   . Hypertension Maternal Grandfather   . Hypertension Paternal Grandmother   . Heart disease Paternal Grandfather   . Hypertension Paternal Grandfather   . Prostate cancer Paternal Grandfather   . Healthy Mother   . Healthy Father   . Hypertension Other   . Diabetes Other   . Cancer Other   . CAD Other   . Pancreatic cancer Paternal Uncle      Social History   Socioeconomic History  . Marital status: Single    Spouse name: Not on file  . Number of children: Not on file  . Years of education: Not on file  . Highest education level: Not on file  Occupational History  . Occupation: truck Public librarian Needs  . Financial resource strain: Not on file  . Food insecurity:    Worry: Not on file    Inability: Not on file  . Transportation needs:    Medical: Not on file  Non-medical: Not on file  Tobacco Use  . Smoking status: Never Smoker  . Smokeless tobacco: Never Used  Substance and Sexual Activity  . Alcohol use: Yes    Alcohol/week: 0.0 oz    Comment: occasional on the weekends (2-3 drinks, liquor and beer)  . Drug use: No  . Sexual activity: Yes  Lifestyle  . Physical activity:    Days per week: Not on file    Minutes per session: Not on file  . Stress: Not on file  Relationships  . Social connections:    Talks on phone: Not on file    Gets together: Not on file    Attends religious service: Not on file    Active member of club or organization: Not on file    Attends meetings of clubs or organizations: Not on file    Relationship status: Not on file  .  Intimate partner violence:    Fear of current or ex partner: Not on file    Emotionally abused: Not on file    Physically abused: Not on file    Forced sexual activity: Not on file  Other Topics Concern  . Not on file  Social History Narrative   Tobacco history none. He does admit to using recreational drugs in the past. He has not smoked marijuana in 7 years.    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 Both of his parents are in there 50 s and are  in good health.  Lives alone in a one story home.  Has 6 children.     Education: college.     BP 118/70   Pulse 77   Ht 5\' 10"  (1.778 m)   Wt (!) 328 lb (148.8 kg)   SpO2 98%   BMI 47.06 kg/m   Physical Exam:  Well appearing NAD HEENT: Unremarkable Neck:  No JVD, no thyromegally Lymphatics:  No adenopathy Back:  No CVA tenderness Lungs:  Clear HEART:  Regular rate rhythm, no murmurs, no rubs, no clicks Abd:  soft, positive bowel sounds, no organomegally, no rebound, no guarding Ext:  2 plus pulses, no edema, no cyanosis, no clubbing Skin:  No rashes no nodules Neuro:  CN II through XII intact, motor grossly intact  EKG  DEVICE  Normal device function.  See PaceArt for details.   Assess/Plan:     HPI Jim Hill returns today for followup. He is a morbidly obese middle aged man with a h/o palpitations and autonomic dysfunction who underwent PPM insertion years ago. He has a h/o SVT and underwent catheter ablation remotely. He has intermittent chest pain. He does not have CAD. He continues to gain weight. He had a stress echo several weeks ago when he presented to the ED in [place]. Negative.  Allergies  Allergen Reactions  . Lisinopril-Hydrochlorothiazide Shortness Of Breath    Felt like he was going to have a MI  . Poractant Alfa Other (See Comments), Hives and Nausea And Vomiting  . Lactase Nausea And Vomiting  . Lactose Intolerance (Gi) Nausea And Vomiting  . Lactulose  Nausea And Vomiting  . Pork-Derived Products Hives, Nausea And Vomiting and Other (See Comments)     headache     Current Outpatient Medications  Medication Sig Dispense Refill  . aspirin 81 MG chewable tablet Chew 81 mg by mouth daily.    . cyclobenzaprine (FLEXERIL) 5 MG tablet Take 1 tablet (5 mg total) by mouth  at bedtime as needed for muscle spasms (Do not take when driving). 30 tablet 3  . metoprolol tartrate (LOPRESSOR) 50 MG tablet Take 1 tablet (50 mg total) by mouth 4 (four) times daily as needed (For palpitations). 360 tablet 1  . Probiotic Product (PROBIOTIC PO) Take 1 tablet by mouth every other day.    . ranitidine (ZANTAC) 150 MG tablet Take 150 mg by mouth daily.    Marland Kitchen. triamterene-hydrochlorothiazide (MAXZIDE-25) 37.5-25 MG tablet TAKE ONE TABLET BY MOUTH ONCE DAILY 90 tablet 3   No current facility-administered medications for this visit.      Past Medical History:  Diagnosis Date  . Barrett's esophagus without dysplasia - subcentimeter changes 06/19/2015  . Chest pain    normal LHC 2008;  ETT-Echo 8/10: normal  . 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 Diastolic heart failure (HCC)   . Gastroparesis 08/19/2015  . GERD (gastroesophageal reflux disease)   . Hypertension   . Obesity   . OSA (obstructive sleep apnea)   . Other specified cardiac dysrhythmias(427.89)   . Sleep apnea   . SVT (supraventricular tachycardia) (HCC)   . Symptomatic bradycardia    [place] pacer in 2006 in [place]    ROS:   All systems reviewed and negative except as noted in the HPI.   Past Surgical History:  Procedure Laterality Date  . APPENDECTOMY  1994  . COLONOSCOPY    . INSERT / REPLACE / REMOVE PACEMAKER     status post pacemaker implant August 2006  . PERMANENT PACEMAKER GENERATOR CHANGE N/A 04/16/2012   Procedure: PERMANENT PACEMAKER GENERATOR CHANGE;  Surgeon: Marinus MawGregg W Cataleyah Colborn, MD;  [facility] C[facility] LAB;  Service: Cardiovascular;  Laterality: N/A;  . UPPER GASTROINTESTINAL  ENDOSCOPY       Family History  Problem Relation Age of Onset  . Hypertension Maternal Grandmother   . Hypertension Maternal Grandfather   . Hypertension Paternal Grandmother   . Heart disease Paternal Grandfather   . Hypertension Paternal Grandfather   . Prostate cancer Paternal Grandfather   . Healthy Mother   . Healthy Father   . Hypertension Other   . Diabetes Other   . Cancer Other   . CAD Other   . Pancreatic cancer Paternal Uncle      Social History   Socioeconomic History  . Marital status: Single    Spouse name: Not on file  . Number of children: Not on file  . Years of education: Not on file  . Highest education level: Not on file  Occupational History  . Occupation: truck Public librariandriver  Social Needs  . Financial resource strain: Not on file  . Food insecurity:    Worry: Not on file    Inability: Not on file  . Transportation needs:    Medical: Not on file    Non-medical: Not on file  Tobacco Use  . Smoking status: Never Smoker  . Smokeless tobacco: Never Used  Substance and Sexual Activity  . Alcohol use: Yes    Alcohol/week: 0.0 oz    Comment: occasional on the weekends (2-3 drinks, liquor and beer)  . Drug use: No  . Sexual activity: Yes  Lifestyle  . Physical activity:    Days per week: Not on file    Minutes per session: Not on file  . Stress: Not on file  Relationships  . Social connections:    Talks on phone: Not on file    Gets together: Not on file    Attends religious  service: Not on file    Active member of club or organization: Not on file    Attends meetings of clubs or organizations: Not on file    Relationship status: Not on file  . Intimate partner violence:    Fear of current or ex partner: Not on file    Emotionally abused: Not on file    Physically abused: Not on file    Forced sexual activity: Not on file  Other Topics Concern  . Not on file  Social History Narrative   Tobacco history none. He does admit to using  recreational drugs in the past. He has not smoked marijuana in 7 years.    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 Both of his parents are in there 50 s and are  in good health.  Lives alone in a one story home.  Has 6 children.     Education: college.     BP 118/70   Pulse 77   Ht 5\' 10"  (1.778 m)   Wt (!) 328 lb (148.8 kg)   SpO2 98%   BMI 47.06 kg/m   Physical Exam:  obese appearing 47 yo man, NAD HEENT: Unremarkable Neck:  6 cm JVD, no thyromegally Lymphatics:  No adenopathy Back:  No CVA tenderness Lungs:  Clear with no wheezes HEART:  Regular rate rhythm, no murmurs, no rubs, no clicks Abd:  soft, positive bowel sounds, no organomegally, no rebound, no guarding Ext:  2 plus pulses, no edema, no cyanosis, no clubbing Skin:  No rashes no nodules Neuro:  CN II through XII intact, motor grossly intact  EKG - NSR  DEVICE  Normal device function.  See PaceArt for details.   Assess/Plan: 1. Chest pain - his symptoms are non-cardiac. He has had a negative stress echo. He is encouraged to increase his physical activity. 2. PPM - his Medtronic device has been programmed to VVI 30 and he has been sensed 100% of the time. 3. Obesity - he continues to gain weight. Over 12 lbs in the past 6 months. I have asked that the patient lose weight.   Leonia Reeves.D.

## 2018-01-31 ENCOUNTER — Encounter: Payer: Self-pay | Admitting: Internal Medicine

## 2018-03-25 ENCOUNTER — Telehealth: Payer: Self-pay | Admitting: Cardiology

## 2018-03-25 ENCOUNTER — Encounter: Payer: BLUE CROSS/BLUE SHIELD | Admitting: *Deleted

## 2018-03-25 NOTE — Telephone Encounter (Signed)
LMOVM reminding pt to send remote transmission.   

## 2018-03-28 ENCOUNTER — Encounter: Payer: Self-pay | Admitting: Cardiology

## 2018-04-01 ENCOUNTER — Ambulatory Visit (INDEPENDENT_AMBULATORY_CARE_PROVIDER_SITE_OTHER): Payer: BLUE CROSS/BLUE SHIELD | Admitting: *Deleted

## 2018-04-01 DIAGNOSIS — I495 Sick sinus syndrome: Secondary | ICD-10-CM | POA: Diagnosis not present

## 2018-04-02 NOTE — Progress Notes (Signed)
Remote pacemaker transmission.   

## 2018-04-23 LAB — CUP PACEART REMOTE DEVICE CHECK
Battery Impedance: 826 Ohm
Battery Voltage: 2.77 V
Brady Statistic RV Percent Paced: 0 %
Date Time Interrogation Session: 20190910114321
Implantable Lead Implant Date: 20060823
Implantable Lead Location: 753860
Implantable Pulse Generator Implant Date: 20130925
Lead Channel Pacing Threshold Amplitude: 1.25 V
Lead Channel Setting Pacing Amplitude: 2.5 V
Lead Channel Setting Pacing Pulse Width: 0.4 ms
MDC IDC LEAD IMPLANT DT: 20060823
MDC IDC LEAD LOCATION: 753859
MDC IDC MSMT BATTERY REMAINING LONGEVITY: 76 mo
MDC IDC MSMT LEADCHNL RA IMPEDANCE VALUE: 67 Ohm
MDC IDC MSMT LEADCHNL RV IMPEDANCE VALUE: 558 Ohm
MDC IDC MSMT LEADCHNL RV PACING THRESHOLD PULSEWIDTH: 0.4 ms
MDC IDC SET LEADCHNL RV SENSING SENSITIVITY: 2 mV

## 2018-06-10 ENCOUNTER — Ambulatory Visit (INDEPENDENT_AMBULATORY_CARE_PROVIDER_SITE_OTHER): Payer: BLUE CROSS/BLUE SHIELD | Admitting: Internal Medicine

## 2018-06-10 ENCOUNTER — Encounter: Payer: Self-pay | Admitting: Internal Medicine

## 2018-06-10 VITALS — BP 138/84 | HR 71 | Ht 71.0 in | Wt 327.8 lb

## 2018-06-10 DIAGNOSIS — I10 Essential (primary) hypertension: Secondary | ICD-10-CM | POA: Diagnosis not present

## 2018-06-10 DIAGNOSIS — I495 Sick sinus syndrome: Secondary | ICD-10-CM

## 2018-06-10 DIAGNOSIS — Z950 Presence of cardiac pacemaker: Secondary | ICD-10-CM | POA: Diagnosis not present

## 2018-06-10 DIAGNOSIS — Z23 Encounter for immunization: Secondary | ICD-10-CM

## 2018-06-10 LAB — CUP PACEART INCLINIC DEVICE CHECK
Battery Impedance: 850 Ohm
Battery Voltage: 2.77 V
Date Time Interrogation Session: 20191119141909
Implantable Lead Implant Date: 20060823
Implantable Lead Location: 753860
Lead Channel Impedance Value: 541 Ohm
Lead Channel Impedance Value: 67 Ohm
Lead Channel Pacing Threshold Amplitude: 1.25 V
Lead Channel Setting Pacing Amplitude: 2.5 V
Lead Channel Setting Sensing Sensitivity: 2 mV
MDC IDC LEAD IMPLANT DT: 20060823
MDC IDC LEAD LOCATION: 753859
MDC IDC MSMT BATTERY REMAINING LONGEVITY: 76 mo
MDC IDC MSMT LEADCHNL RV PACING THRESHOLD PULSEWIDTH: 0.4 ms
MDC IDC PG IMPLANT DT: 20130925
MDC IDC SET LEADCHNL RV PACING PULSEWIDTH: 0.4 ms
MDC IDC STAT BRADY RV PERCENT PACED: 0 %
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
## 2018-06-10 NOTE — Patient Instructions (Signed)

## 2018-06-12 NOTE — Addendum Note (Signed)
Addended by: Gareld Obrecht H on: 06/12/2018 04:55 PM   Modules accepted: Orders  

## 2018-07-01 ENCOUNTER — Ambulatory Visit (INDEPENDENT_AMBULATORY_CARE_PROVIDER_SITE_OTHER): Payer: BLUE CROSS/BLUE SHIELD

## 2018-07-01 ENCOUNTER — Telehealth: Payer: Self-pay | Admitting: Cardiology

## 2018-07-01 DIAGNOSIS — I495 Sick sinus syndrome: Secondary | ICD-10-CM | POA: Diagnosis not present

## 2018-07-01 NOTE — Telephone Encounter (Signed)
Spoke with pt and reminded pt of remote transmission that is due today. Pt verbalized understanding.   

## 2018-07-04 NOTE — Progress Notes (Signed)
Remote pacemaker transmission.   

## 2018-07-30 ENCOUNTER — Telehealth: Payer: Self-pay | Admitting: Internal Medicine

## 2018-07-30 NOTE — Telephone Encounter (Signed)
  Patient needs to clarify some things in his medical record with the nurse and discuss what he needs to pass the physical to go back to work.

## 2018-07-31 NOTE — Telephone Encounter (Signed)
Returned call to Pt.  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  Upon further review appears first documented by Dr. Ladona Ridgel in 2015.  Will discuss with Dr. Ladona Ridgel.  Pt aware may take a few days for this nurse to research.

## 2018-08-07 ENCOUNTER — Telehealth: Payer: Self-pay

## 2018-08-07 DIAGNOSIS — I5032 Chronic diastolic (congestive) heart failure: Secondary | ICD-10-CM

## 2018-08-07 NOTE — Telephone Encounter (Signed)
Me      4:49 PM  Note    Returned call to Pt.  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  Upon further review appears first documented by Dr. Ladona Ridgel in 2015.  Will discuss with Dr. Ladona Ridgel.  Pt aware may take a few days for this nurse to research       Call returned to Pt.    Discussed with Dr. Ladona Ridgel.  Dr. Ladona Ridgel requested Pt repeat ECHO.  Advised Pt.  Pt indicates understanding.  Would like to come to [facility] for Echo.  Order entered.  Will continue to monitor.

## 2018-08-10 LAB — CUP PACEART REMOTE DEVICE CHECK
Battery Impedance: 877 Ohm
Battery Remaining Longevity: 70 mo
Battery Voltage: 2.77 V
Brady Statistic RV Percent Paced: 0 %
Date Time Interrogation Session: 20191213051759
Implantable Lead Implant Date: 20060823
Implantable Lead Implant Date: 20060823
Implantable Lead Location: 753859
Implantable Lead Model: 5076
Implantable Lead Model: 5076
Implantable Pulse Generator Implant Date: 20130925
Lead Channel Impedance Value: 558 Ohm
Lead Channel Impedance Value: 67 Ohm
Lead Channel Setting Pacing Amplitude: 2.5 V
Lead Channel Setting Pacing Pulse Width: 0.4 ms
Lead Channel Setting Sensing Sensitivity: 2 mV
MDC IDC LEAD LOCATION: 753860

## 2018-08-18 ENCOUNTER — Other Ambulatory Visit (HOSPITAL_COMMUNITY): Payer: BLUE CROSS/BLUE SHIELD

## 2018-08-21 ENCOUNTER — Ambulatory Visit (HOSPITAL_COMMUNITY): Payer: BLUE CROSS/BLUE SHIELD | Attending: Cardiovascular Disease

## 2018-08-21 DIAGNOSIS — I5032 Chronic diastolic (congestive) heart failure: Secondary | ICD-10-CM | POA: Insufficient documentation

## 2018-08-21 MED ORDER — PERFLUTREN LIPID MICROSPHERE
1.0000 mL | INTRAVENOUS | Status: AC | PRN
  Administered 2018-08-21: 2 mL via INTRAVENOUS

## 2018-08-27 ENCOUNTER — Telehealth: Payer: Self-pay

## 2018-08-27 DIAGNOSIS — I5030 Unspecified diastolic (congestive) heart failure: Secondary | ICD-10-CM

## 2018-08-27 NOTE — Telephone Encounter (Signed)
Left detailed message per DPR.  Advised Pt per results of ECHO he has normal heart pumping function with no diastolic heart failure.  Asked Pt to call office to notify nurse if he needs a letter for his job.  This nurse name and # left for call back.  Resolved active diagnosis of Diastolic heart failure.

## 2018-09-08 ENCOUNTER — Telehealth: Payer: Self-pay

## 2018-09-08 NOTE — Telephone Encounter (Signed)
Attempted to call Pt.  Phone rang and then went to a busy signal.  Will send a MyChart message.

## 2018-09-08 NOTE — Telephone Encounter (Signed)
error 

## 2018-09-30 ENCOUNTER — Encounter: Payer: BLUE CROSS/BLUE SHIELD | Admitting: *Deleted

## 2018-10-01 ENCOUNTER — Telehealth: Payer: Self-pay

## 2018-10-01 NOTE — Telephone Encounter (Signed)
Left message for patient to remind of missed remote transmission.  

## 2018-10-02 ENCOUNTER — Other Ambulatory Visit: Payer: Self-pay | Admitting: Internal Medicine

## 2018-10-08 ENCOUNTER — Encounter: Payer: Self-pay | Admitting: Cardiology

## 2018-10-22 LAB — CUP PACEART REMOTE DEVICE CHECK
Battery Impedance: 1010 Ohm
Battery Remaining Longevity: 67 mo
Battery Voltage: 2.77 V
Brady Statistic RV Percent Paced: 0 %
Date Time Interrogation Session: 20200401193350
Implantable Lead Implant Date: 20060823
Implantable Lead Implant Date: 20060823
Implantable Lead Location: 753859
Implantable Lead Location: 753860
Implantable Lead Model: 5076
Implantable Lead Model: 5076
Implantable Pulse Generator Implant Date: 20130925
Lead Channel Impedance Value: 528 Ohm
Lead Channel Sensing Intrinsic Amplitude: 5.6 mV
Lead Channel Setting Pacing Amplitude: 2.5 V
Lead Channel Setting Pacing Pulse Width: 0.4 ms
Lead Channel Setting Sensing Sensitivity: 2 mV

## 2018-10-23 ENCOUNTER — Ambulatory Visit (INDEPENDENT_AMBULATORY_CARE_PROVIDER_SITE_OTHER): Payer: BLUE CROSS/BLUE SHIELD | Admitting: *Deleted

## 2018-10-23 ENCOUNTER — Other Ambulatory Visit: Payer: Self-pay

## 2018-10-23 DIAGNOSIS — I495 Sick sinus syndrome: Secondary | ICD-10-CM

## 2018-10-24 ENCOUNTER — Telehealth: Payer: Self-pay | Admitting: Internal Medicine

## 2018-10-24 NOTE — Telephone Encounter (Signed)
Pt will be faxing forms to the office for Dr. Ladona Ridgel to fill out.  Pt received a bill from his insurance company, because Dr. Ladona Ridgel is no longer in the pt's helthcare network. If Dr. Ladona Ridgel fills out the forms for the pt, the insurance company will make Dr. Ladona Ridgel part of the pt's covered provider list.  Pt has been seeing Dr. Ladona Ridgel for too long, and does not want to switch to another provider.

## 2018-10-28 ENCOUNTER — Telehealth: Payer: Self-pay | Admitting: Internal Medicine

## 2018-10-28 NOTE — Telephone Encounter (Signed)
Discussed preliminary transmission results with patient. Explained most of the VHR episodes appear 1:1 SVT or ST, similar to previous episodes (SEGM, V markers only). Advised that if Dr. Ladona Ridgel wants to do anything differently after reviewing transmission, we will call back. Pt verbalizes understanding, denies any cardiac issues at this time. He thanked me for my call.

## 2018-10-28 NOTE — Telephone Encounter (Signed)
  1. Has your device fired? NO  2. Is you device beeping? NO  3. Are you experiencing draining or swelling at device site? NO  4. Are you calling to see if we received your device transmission? YES  5. Have you passed out? NO      Please route to Device Clinic Pool  

## 2018-10-28 NOTE — Telephone Encounter (Signed)
Informed pt that we received his remote transmission from 10/22/2018. He has questions about the results.

## 2018-10-31 ENCOUNTER — Encounter: Payer: Self-pay | Admitting: Cardiology

## 2018-10-31 NOTE — Progress Notes (Signed)
Remote pacemaker transmission.   

## 2018-11-04 ENCOUNTER — Telehealth: Payer: Self-pay | Admitting: Internal Medicine

## 2018-11-04 ENCOUNTER — Telehealth: Payer: Self-pay | Admitting: *Deleted

## 2018-11-04 NOTE — Telephone Encounter (Signed)
Spoke with patient. He reports he already talked to [place] today and scheduled an appointment on 6/1. Clarified that this appointment is in office with a Device Clinic RN. Pt verbalizes understanding and agreement with plan. He agrees to call in the interim with any questions or concerns.

## 2018-11-04 NOTE — Telephone Encounter (Signed)
  Patient is returning call regarding appt in June per previous note

## 2018-11-04 NOTE — Telephone Encounter (Signed)
See other phone note from 11/04/18.

## 2018-11-04 NOTE — Telephone Encounter (Signed)
-----   Message from Marinus Maw, MD sent at 11/01/2018  8:30 PM EDT ----- Remote device check reviewed. Histograms appropriate. Leads and battery stable for patient. Follow up as outlined above. No recommended changes. I strongly suspect that these are supraventricular and likely sinus tach. However, we need to reprogram his device DDI 30. I want to know the AV relationship when he has these higher heart rates.

## 2018-11-04 NOTE — Telephone Encounter (Signed)
Attempted to reach patient to schedule Device Clinic appointment in June for [facility] reprogramming per Dr. Ladona Ridgel. No answer, no VM. Will try again later.

## 2018-11-07 ENCOUNTER — Telehealth: Payer: Self-pay

## 2018-11-07 ENCOUNTER — Telehealth: Payer: Self-pay | Admitting: Internal Medicine

## 2018-11-07 NOTE — Telephone Encounter (Signed)
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 He would like for it to be put on my chart so he can print it out.

## 2018-11-07 NOTE — Telephone Encounter (Signed)
Error

## 2018-11-07 NOTE — Telephone Encounter (Signed)
See mychart thread

## 2018-11-20 NOTE — Telephone Encounter (Signed)
Mailed updated letter stating Pt may work.

## 2018-12-07 ENCOUNTER — Other Ambulatory Visit: Payer: Self-pay | Admitting: Internal Medicine

## 2018-12-24 ENCOUNTER — Telehealth: Payer: Self-pay | Admitting: Internal Medicine

## 2018-12-24 NOTE — Telephone Encounter (Signed)
New Message:    Patient returning call back concerning his device. Please call patient back.

## 2018-12-24 NOTE — Telephone Encounter (Signed)
Pt Thought visit earlier this week was remote. Rescheduled in person pacer check for next week.

## 2018-12-31 ENCOUNTER — Telehealth: Payer: Self-pay

## 2018-12-31 NOTE — Telephone Encounter (Signed)
    COVID-19 Pre-Screening Questions:  . In the past 7 to 10 days have you had a cough,  shortness of breath, headache, congestion, fever (100 or greater) body aches, chills, sore throat, or sudden loss of taste or sense of smell? . Have you been around anyone with known Covid 19. . Have you been around anyone who is awaiting Covid 19 test results in the past 7 to 10 days? . Have you been around anyone who has been exposed to Covid 19, or has mentioned symptoms of Covid 19 within the past 7 to 10 days?  If you have any concerns/questions about symptoms patients report during screening (either on the phone or at threshold). Contact the provider seeing the patient or DOD for further guidance.  If neither are available contact a member of the leadership team.   Pt answered no to all covid-19 questions. I instructed the pt to wear a mask to the appointment if he has one. I also told him not to bring anyone to his appointment due to we are limiting the amount of people coming in and out the office.   The pt would like to set up an appointment with Dr. Lovena Le to get some paperwork fill out. I told him I will send a phone not to the scheduler and she will call him to set up the appointment.

## 2019-01-01 ENCOUNTER — Other Ambulatory Visit: Payer: Self-pay

## 2019-01-01 ENCOUNTER — Ambulatory Visit (INDEPENDENT_AMBULATORY_CARE_PROVIDER_SITE_OTHER): Payer: BLUE CROSS/BLUE SHIELD | Admitting: *Deleted

## 2019-01-01 DIAGNOSIS — I5032 Chronic diastolic (congestive) heart failure: Secondary | ICD-10-CM

## 2019-01-01 LAB — CUP PACEART INCLINIC DEVICE CHECK
Battery Impedance: 1061 Ohm
Battery Remaining Longevity: 65 mo
Battery Voltage: 2.77 V
Brady Statistic AP VP Percent: 0 %
Brady Statistic AP VS Percent: 0 %
Brady Statistic AS VP Percent: 0 %
Brady Statistic AS VS Percent: 0 %
Date Time Interrogation Session: 20200611132523
Implantable Lead Implant Date: 20060823
Implantable Lead Implant Date: 20060823
Implantable Lead Location: 753859
Implantable Lead Location: 753860
Implantable Lead Model: 5076
Implantable Lead Model: 5076
Implantable Pulse Generator Implant Date: 20130925
Lead Channel Impedance Value: 542 Ohm
Lead Channel Impedance Value: 67 Ohm
Lead Channel Pacing Threshold Amplitude: 0.5 V
Lead Channel Pacing Threshold Amplitude: 1.5 V
Lead Channel Pacing Threshold Pulse Width: 0.4 ms
Lead Channel Pacing Threshold Pulse Width: 0.4 ms
Lead Channel Sensing Intrinsic Amplitude: 2 mV
Lead Channel Sensing Intrinsic Amplitude: 4 mV
Lead Channel Setting Pacing Amplitude: 1.5 V
Lead Channel Setting Pacing Amplitude: 2.5 V
Lead Channel Setting Pacing Pulse Width: 0.4 ms
Lead Channel Setting Sensing Sensitivity: 2 mV

## 2019-01-01 NOTE — Progress Notes (Signed)
Pacemaker check in clinic. Normal device function. Thresholds, sensing, impedances consistent with previous measurements. Device programmed to maximize longevity. 14 high ventricular rates noted, the longest was 1 minute 10 sec in length and the highest rate was 175 bpm. Per GT Mode programmed at DDI with lower rate of 30.  Device programmed at appropriate safety margins. Histogram distribution appropriate for patient activity level. Device programmed to optimize intrinsic conduction. Estimated longevity 5 yrs. Next remote transmission 01/22/19. Plan to follow-up with GT 11/20. Patient education completed.

## 2019-01-22 ENCOUNTER — Encounter: Payer: BLUE CROSS/BLUE SHIELD | Admitting: *Deleted

## 2019-01-22 ENCOUNTER — Telehealth: Payer: Self-pay

## 2019-01-22 NOTE — Telephone Encounter (Signed)
Left message for patient to remind of missed remote transmission.  

## 2019-01-29 ENCOUNTER — Ambulatory Visit (INDEPENDENT_AMBULATORY_CARE_PROVIDER_SITE_OTHER): Payer: BLUE CROSS/BLUE SHIELD | Admitting: *Deleted

## 2019-01-29 DIAGNOSIS — I471 Supraventricular tachycardia: Secondary | ICD-10-CM | POA: Diagnosis not present

## 2019-01-29 DIAGNOSIS — I5032 Chronic diastolic (congestive) heart failure: Secondary | ICD-10-CM

## 2019-01-30 LAB — CUP PACEART REMOTE DEVICE CHECK
Battery Impedance: 1119 Ohm
Battery Remaining Longevity: 59 mo
Battery Voltage: 2.76 V
Brady Statistic AP VP Percent: 0 %
Brady Statistic AP VS Percent: 0 %
Brady Statistic AS VP Percent: 0 %
Brady Statistic AS VS Percent: 100 %
Date Time Interrogation Session: 20200709124243
Implantable Lead Implant Date: 20060823
Implantable Lead Implant Date: 20060823
Implantable Lead Location: 753859
Implantable Lead Location: 753860
Implantable Lead Model: 5076
Implantable Lead Model: 5076
Implantable Pulse Generator Implant Date: 20130925
Lead Channel Impedance Value: 386 Ohm
Lead Channel Impedance Value: 520 Ohm
Lead Channel Setting Pacing Amplitude: 1.5 V
Lead Channel Setting Pacing Amplitude: 2.5 V
Lead Channel Setting Pacing Pulse Width: 0.4 ms
Lead Channel Setting Sensing Sensitivity: 2 mV

## 2019-02-03 NOTE — Telephone Encounter (Signed)
Follow up   Patient is calling to see if the transmission was received per the previous message. Please call.

## 2019-02-04 NOTE — Telephone Encounter (Signed)
Spoke to pt, let him know we received transmission. All questions answered.

## 2019-02-09 NOTE — Progress Notes (Signed)
Remote pacemaker transmission.   

## 2019-03-03 ENCOUNTER — Telehealth: Payer: Self-pay | Admitting: Cardiology

## 2019-03-03 NOTE — Telephone Encounter (Signed)
Pt called in and stated that he has been feeling fatigue, ShoB, dizzy, no chest pain. Instructed pt to send a manual transmission w/ his home monitor. Pt verbalized understanding and stated that he would send it when he gets home.

## 2019-03-04 NOTE — Telephone Encounter (Signed)
Transmission received 03/04/19. Normal PPM function, sensing and impedances stable, threshold tests not automatic. AS-VS 100.0% with lower rate at 30bpm (programmed DDI). Presenting rhythm As/Vs @ 87bpm. 5 VHR episodes--markers/EGMs show 1:1 tachycardia, four episodes on 02/23/19 between 14:50-14:52, avg V rates 160-163bpm. Pt reports he was likely active at that time, doesn't recall any worsened symptoms.  Pt reports fatigue has been going on for a couple of weeks. Taking metoprolol tartrate 50mg  every morning, has not needed to take additional doses recently. Saw his pulmonologist today, was told his CPAP is working correctly. Was referred to PCP for further workup, has f/u with PCP on 8/14. Advised pt to keep PCP f/u to ensure non-cardiac causes are ruled out. Explained I will forward info to Dr. Lovena Le for any additional recommendations. Pt verbalizes understanding and thanked me for my call.

## 2019-05-01 ENCOUNTER — Encounter: Payer: BLUE CROSS/BLUE SHIELD | Admitting: *Deleted

## 2019-07-16 ENCOUNTER — Other Ambulatory Visit: Payer: Self-pay

## 2019-07-16 ENCOUNTER — Encounter: Payer: Self-pay | Admitting: Internal Medicine

## 2019-07-16 ENCOUNTER — Ambulatory Visit (INDEPENDENT_AMBULATORY_CARE_PROVIDER_SITE_OTHER): Payer: Self-pay | Admitting: Internal Medicine

## 2019-07-16 ENCOUNTER — Ambulatory Visit (INDEPENDENT_AMBULATORY_CARE_PROVIDER_SITE_OTHER): Payer: Self-pay | Admitting: *Deleted

## 2019-07-16 VITALS — BP 142/90 | HR 67 | Ht 71.0 in | Wt 342.0 lb

## 2019-07-16 DIAGNOSIS — Z950 Presence of cardiac pacemaker: Secondary | ICD-10-CM

## 2019-07-16 DIAGNOSIS — R001 Bradycardia, unspecified: Secondary | ICD-10-CM

## 2019-07-16 DIAGNOSIS — I10 Essential (primary) hypertension: Secondary | ICD-10-CM

## 2019-07-16 NOTE — Patient Instructions (Signed)
Medication Instructions:  Your physician recommends that you continue on your current medications as directed. Please refer to the Current Medication list given to you today.  Labwork: None ordered.  Testing/Procedures: None ordered.  Follow-Up: Your physician wants you to follow-up in: one year with Dr. Lovena Le.   You will receive a reminder letter in the mail two months in advance. If you don't receive a letter, please call our office to schedule the follow-up appointment.  Remote monitoring is used to monitor your Pacemaker from home. This monitoring reduces the number of office visits required to check your device to one time per year. It allows [place] to keep an eye on the functioning of your device to ensure it is working properly. You are scheduled for a device check from home on 07/31/2019. You may send your transmission at any time that day. If you have a wireless device, the transmission will be sent automatically. After your physician reviews your transmission, you will receive a postcard with your next transmission date.  Any Other Special Instructions Will Be Listed Below (If Applicable).  If you need a refill on your cardiac medications before your next appointment, please call your pharmacy.

## 2019-07-16 NOTE — Progress Notes (Signed)
HPI Mr .Grand is referred today for ongoing evaluation and management of his PPM, obesity and chronic chest pain. He has been stable in the past year from a cardiac perspective. He admits to dietary indiscretion and has gained another 15 lbs. He denies chest pain. He injured his right knee about 2 months ago and has not been able to exercise.  Allergies  Allergen Reactions  . Lisinopril-Hydrochlorothiazide Shortness Of Breath    Felt like he was going to have a MI  . Lactase Nausea And Vomiting  . Lactose Intolerance (Gi) Nausea And Vomiting  . Lactulose Nausea And Vomiting  . Pork-Derived Products Hives, Nausea And Vomiting and Other (See Comments)     headache     Current Outpatient Medications  Medication Sig Dispense Refill  . aspirin 81 MG chewable tablet Chew 81 mg by mouth daily.    . cyclobenzaprine (FLEXERIL) 10 MG tablet Take 10 mg by mouth at bedtime.    . cyclobenzaprine (FLEXERIL) 5 MG tablet Take 1 tablet (5 mg total) by mouth at bedtime as needed for muscle spasms (Do not take when driving). 30 tablet 3  . metoprolol tartrate (LOPRESSOR) 50 MG tablet TAKE 1 TABLET BY MOUTH 4 TIMES DAILY AS NEEDED FOR  PALPITATIONS 360 tablet 0  . omeprazole (PRILOSEC) 20 MG capsule Take 20 mg by mouth daily.    Marland Kitchen triamterene-hydrochlorothiazide (MAXZIDE-25) 37.5-25 MG tablet Take 1 tablet by mouth once daily 90 tablet 3   No current facility-administered medications for this visit.     Past Medical History:  Diagnosis Date  . Barrett's esophagus without dysplasia - subcentimeter changes 06/19/2015  . Chest pain    normal LHC 2008;  ETT-Echo 8/10: normal  . 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 Diastolic heart failure (HCC)   . Gastroparesis 08/19/2015  . GERD (gastroesophageal reflux disease)   . Hypertension   . Obesity   . OSA (obstructive sleep apnea)   . Other specified cardiac dysrhythmias(427.89)   . Sleep apnea   . SVT (supraventricular tachycardia) (HCC)     . Symptomatic bradycardia    s/p pacer in 2006 in [place]    ROS:   All systems reviewed and negative except as noted in the HPI.   Past Surgical History:  Procedure Laterality Date  . APPENDECTOMY  1994  . COLONOSCOPY    . INSERT / REPLACE / REMOVE PACEMAKER     status post pacemaker implant August 2006  . PERMANENT PACEMAKER GENERATOR CHANGE N/A 04/16/2012   Procedure: PERMANENT PACEMAKER GENERATOR CHANGE;  Surgeon: Marinus Maw, MD;  Location: [facility] CATH LAB;  Service: Cardiovascular;  Laterality: N/A;  . UPPER GASTROINTESTINAL ENDOSCOPY       Family History  Problem Relation Age of Onset  . Hypertension Maternal Grandmother   . Hypertension Maternal Grandfather   . Hypertension Paternal Grandmother   . Heart disease Paternal Grandfather   . Hypertension Paternal Grandfather   . Prostate cancer Paternal Grandfather   . Healthy Mother   . Healthy Father   . Hypertension Other   . Diabetes Other   . Cancer Other   . CAD Other   . Pancreatic cancer Paternal Uncle      Social History   Socioeconomic History  . Marital status: Single    Spouse name: Not on file  . Number of children: Not on file  . Years of education: Not on file  . Highest education level: Not on file  Occupational  History  . Occupation: truck Geophysicist/field seismologist  Tobacco Use  . Smoking status: Never Smoker  . Smokeless tobacco: Never Used  Substance and Sexual Activity  . Alcohol use: Yes    Alcohol/week: 0.0 standard drinks    Comment: occasional on the weekends (2-3 drinks, liquor and beer)  . Drug use: No  . Sexual activity: Yes  Other Topics Concern  . Not on file  Social History Narrative   Tobacco history none. He does admit to using recreational drugs in the past. He has not smoked marijuana in 7 years.    He is currently working as a Administrator, He has 6 siblings all in good health. Both of his parents are in there 72 s and are  in good health.  Lives alone in a one story home.  Has 6  children.     Education: college.   Social Determinants of Health   Financial Resource Strain:   . Difficulty of Paying Living Expenses: Not on file  Food Insecurity:   . Worried About Charity fundraiser in the Last Year: Not on file  . Ran Out of Food in the Last Year: Not on file  Transportation Needs:   . Lack of Transportation (Medical): Not on file  . Lack of Transportation (Non-Medical): Not on file  Physical Activity:   . Days of Exercise per Week: Not on file  . Minutes of Exercise per Session: Not on file  Stress:   . Feeling of Stress : Not on file  Social Connections:   . Frequency of Communication with Friends and Family: Not on file  . Frequency of Social Gatherings with Friends and Family: Not on file  . Attends Religious Services: Not on file  . Active Member of Clubs or Organizations: Not on file  . Attends Archivist Meetings: Not on file  . Marital Status: Not on file  Intimate Partner Violence:   . Fear of Current or Ex-Partner: Not on file  . Emotionally Abused: Not on file  . Physically Abused: Not on file  . Sexually Abused: Not on file     BP (!) 142/90   Pulse 67   Ht 5\' 11"  (1.803 m)   Wt (!) 342 lb (155.1 kg)   SpO2 97%   BMI 47.70 kg/m   Physical Exam:  Well appearing NAD HEENT: Unremarkable Neck:  No JVD, no thyromegally Lymphatics:  No adenopathy Back:  No CVA tenderness Lungs:  Clear HEART:  Regular rate rhythm, no murmurs, no rubs, no clicks Abd:  soft, positive bowel sounds, no organomegally, no rebound, no guarding Ext:  2 plus pulses, no edema, no cyanosis, no clubbing Skin:  No rashes no nodules Neuro:  CN II through XII intact, motor grossly intact  EKG - nsr  DEVICE  Normal device function.  See PaceArt for details.   Assess/Plan: 1. Sinus node dysfunction - he is asymptomatic and is pacing minimally.  2. HTN - his bp is elevated. I strongly encouraged him to lose weight. He is to avoid sodium. 3. 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 4. Obesity - I strongly encouraged the patient to lose weight.  Mikle Bosworth.D.

## 2019-07-17 LAB — CUP PACEART REMOTE DEVICE CHECK
Battery Impedance: 1306 Ohm
Battery Remaining Longevity: 53 mo
Battery Voltage: 2.77 V
Brady Statistic AP VP Percent: 0 %
Brady Statistic AP VS Percent: 0 %
Brady Statistic AS VP Percent: 0 %
Brady Statistic AS VS Percent: 100 %
Date Time Interrogation Session: 20201223213101
Implantable Lead Implant Date: 20060823
Implantable Lead Implant Date: 20060823
Implantable Lead Location: 753859
Implantable Lead Location: 753860
Implantable Lead Model: 5076
Implantable Lead Model: 5076
Implantable Pulse Generator Implant Date: 20130925
Lead Channel Impedance Value: 363 Ohm
Lead Channel Impedance Value: 532 Ohm
Lead Channel Setting Pacing Amplitude: 1.5 V
Lead Channel Setting Pacing Amplitude: 2.5 V
Lead Channel Setting Pacing Pulse Width: 0.4 ms
Lead Channel Setting Sensing Sensitivity: 2 mV

## 2019-08-06 ENCOUNTER — Encounter: Payer: BLUE CROSS/BLUE SHIELD | Admitting: Internal Medicine

## 2019-08-06 ENCOUNTER — Encounter

## 2019-08-27 ENCOUNTER — Telehealth: Payer: Self-pay | Admitting: Internal Medicine

## 2019-08-27 NOTE — Telephone Encounter (Signed)
DOE when climbing stairs.  For about the last 3 weeks or so. He said all his tests came back negative at [facility] ER.Went to ER for this today [facility]).  Still there during this phone call with him. He told them he will check with his cardiologist to see whether or not he needed a stress test.   They are going to check his pacemaker also.  He is aware I will forward to Dr. Ladona Ridgel and his nurse to make aware and for follow up.

## 2019-08-27 NOTE — Telephone Encounter (Signed)
New Message    Pt is calling and would like for Dr Bruna Potter nurse to call him back  He says he has had some changes in his breathing and would like a stress test     Please call

## 2019-09-03 ENCOUNTER — Encounter: Payer: Self-pay | Admitting: Internal Medicine

## 2019-09-04 ENCOUNTER — Encounter: Payer: Self-pay | Admitting: Internal Medicine

## 2019-09-17 NOTE — Progress Notes (Signed)
Cardiology Office Note Date:  09/18/2019  Patient ID:  Jim Hill, DOB 1970-08-22, MRN [id number] PCP:  Lenna Sciara, DO  Cardiologist:  Dr. Ladona Ridgel   Chief Complaint: post ER visit   History of Present Illness: Jim Hill is a 49 y.o. male with history of gastroparesis, morbid obesity, HTN, OSA (w/CPAP), chronic CHF (diastolic), symptomatic bradycardia w/PPM, SVT (unclear h/o ablation seems in 2016, this may have been in [place], Dr. Lubertha Basque note mentions He had a second catheter ablation several months ago and returns for followup. The only records I have state he had no inducible SVT and got a slow pathway modification for his first procedure), notes discuss suspect autonomic dysfunction   He comes in today to be seen for Dr. Ladona Ridgel, last seen by him Dec 2020.  He mentions has chronic CP, stable in the past year, at that time had gained 15lbs and admitted to dietary liberalizations.  No changes were made  He was in ER at [facility] with c/o weakness, fatigue, felt like he was falling asleep driving, as well as progressive SOB, DOE.  Had reported diarrhea 4 days earlier that had remained resolved. Seems they tried to send a remote transmission but was unsuccessful, recommended having rep come in but the patient declined, recommended is seems admission for observation and further w/u but he again declined and wanted to go home COVID and flu negative BNP 13 WBC 5.9 H/H 13/41 Plts 233 K+ 3.9 BUN/Creat 14/1.34 Trop I < 0.03 LFTs wnl TSH 1.26 CXR w/NAP  He tells me he went to the ER with knee pain, that to home was very clearly joint/bobe related with this being a chronic pain that has worsened over the years, and acutely that day.  He says they asked about CP SOB, and he mentioned that occassionaly he feels winded and that set off all the workup.  He feels well.  Mentions that occasionally he wakes with feeling his heart is fast and SOB, he thinks perhaps might be dreaming,  also wonders if he is on the right mask with his CPAP, that he uses nasal mask and he wakes with very dry mouth and thinks he is breathing through his mouth.   He denies any kind of CP   No dizzy spells, near syncope or syncope.  He will sometimes feel a fleeting fast rates infrequently, no associated symptoms, and no particular trigger or pattern.   Device information MST dual chamber PPM, Implanted 03/14/2005, gen change 2013   Past Medical History:  Diagnosis Date  . Barrett's esophagus without dysplasia - subcentimeter changes 06/19/2015  . Chest pain    normal LHC 2008;  ETT-Echo 8/10: normal  . 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 Diastolic heart failure (HCC)   . Gastroparesis 08/19/2015  . GERD (gastroesophageal reflux disease)   . Hypertension   . Obesity   . OSA (obstructive sleep apnea)   . Other specified cardiac dysrhythmias(427.89)   . Sleep apnea   . SVT (supraventricular tachycardia) (HCC)   . Symptomatic bradycardia    s/p pacer in 2006 in [place]    Past Surgical History:  Procedure Laterality Date  . APPENDECTOMY  1994  . COLONOSCOPY    . INSERT / REPLACE / REMOVE PACEMAKER     status post pacemaker implant August 2006  . PERMANENT PACEMAKER GENERATOR CHANGE N/A 04/16/2012   Procedure: PERMANENT PACEMAKER GENERATOR CHANGE;  Surgeon: Marinus Maw, MD;  Location: [facility] CATH LAB;  Service: Cardiovascular;  Laterality: N/A;  . UPPER GASTROINTESTINAL ENDOSCOPY      Current Outpatient Medications  Medication Sig Dispense Refill  . aspirin 81 MG chewable tablet Chew 81 mg by mouth daily.    . cyclobenzaprine (FLEXERIL) 5 MG tablet Take 1 tablet (5 mg total) by mouth at bedtime as needed for muscle spasms (Do not take when driving). 30 tablet 3  . metoprolol tartrate (LOPRESSOR) 50 MG tablet TAKE 1 TABLET BY MOUTH 4 TIMES DAILY AS NEEDED FOR  PALPITATIONS 360 tablet 0  . omeprazole (PRILOSEC) 20 MG capsule Take 20 mg by mouth daily.    Marland Kitchen  triamterene-hydrochlorothiazide (MAXZIDE-25) 37.5-25 MG tablet Take 1 tablet by mouth once daily 90 tablet 3   No current facility-administered medications for this visit.    Allergies:   Lisinopril-hydrochlorothiazide, Lactase, Lactose intolerance (gi), Lactulose, and Pork-derived products   Social History:  The patient  reports that he has never smoked. He has never used smokeless tobacco. He reports current alcohol use. He reports that he does not use drugs.   Family History:  The patient's family history includes CAD in an other family member; Cancer in an other family member; Diabetes in an other family member; Healthy in his father and mother; Heart disease in his paternal grandfather; Hypertension in his maternal grandfather, maternal grandmother, paternal grandfather, paternal grandmother, and another family member; Pancreatic cancer in his paternal uncle; Prostate cancer in his paternal grandfather.  ROS:  Please see the history of present illness.  All other systems are reviewed and otherwise negative.   PHYSICAL EXAM:  VS:  BP (!) 128/94   Pulse 62   Ht 5\' 11"  (1.803 m)   Wt (!) 314 lb (142.4 kg)   BMI 43.79 kg/m  BMI: Body mass index is 43.79 kg/m.  A recheck is 132/86  Well nourished, well developed, in no acute distress  HEENT: normocephalic, atraumatic  Neck: no JVD, carotid bruits or masses Cardiac:  RRR; no significant murmurs, no rubs, or gallops Lungs: CTA b/l, no wheezing, rhonchi or rales  Abd: soft, nontender, obese MS: no deformity or atrophy Ext:  no edema  Skin: warm and dry, no rash Neuro:  No gross deficits appreciated Psych: euthymic mood, full affect  PPM site is stable, no tethering or discomfort   EKG:  Done today and reviewed by myself shows SR, no acute changes   PPM interrogation done today and reviewed by myself; Battery estimate is 4 years RA lead measurements are good RV lead threshold today is 2.0V/0.45ms and 1.24/1.2ms June 2020  RV  lead threshold 1.5/0.4 impedance and sensing stable 6 HVR episodes, these are 1;1, longest 73min:16sec   08/21/2018: TTE IMPRESSIONS  1. The left ventricle has normal systolic function of 96-04%. The cavity  size is normal. There is normal left ventricular hypertrophy. Echo  evidence of normal diastolic filling patterns.  2. Right ventricular systolic pressure is is mildly elevated.  3. Pacing wires in RV/RA.  4. Mildly dilated left atrial size.  5. Normal right atrial size.  6. The mitral valve normal in structure.  7. Normal tricuspid valve.  8. Tricuspid regurgitation is mild.  9. Aortic valve tricuspid.  10. No atrial level shunt detected by color flow Doppler.    09/25/2016; ETT   Blood pressure demonstrated a hypertensive response to exercise.  No T wave inversion was noted during stress.  There was no ST segment deviation noted during stress.  Overall, the patient's exercise capacity was normal.  [facility] Treadmill  Score: low risk  85% of maximum heart rate was achieved after 6.2 minutes.  Recovery time:  5 minutes.   Negative stress test without evidence of ischemia at given workload.     06/08/2015: stress myoview  Nuclear stress EF: 51%. The LV function is the lower limits of normal .  The study is normal. No evidence of ischemia.  This is a low risk study.  Recent Labs: No results found for requested labs within last 8760 hours.  No results found for requested labs within last 8760 hours.   CrCl cannot be calculated (Patient's most recent lab result is older than the maximum 21 days allowed.).   Wt Readings from Last 3 Encounters:  09/18/19 (!) 314 lb (142.4 kg)  07/16/19 (!) 342 lb (155.1 kg)  06/10/18 (!) 327 lb 12.8 oz (148.7 kg)     Other studies reviewed: Additional studies/records reviewed today include: summarized above  ASSESSMENT AND PLAN:  1. PPM     Intact function, RV threshold is up some, programmed PW to 1.28ms for 2:1     He  is programmed DDI 30,  And is 100%  V sensing  2. HTN     Discussed weight loss and exercise  3. Chronic CHF (diastolic)     No symptoms or exam findings to suggest volume OL, CXR was clear  4. SVT      Low burden, he has not tolerated daily BB in the past, he reports 2/2 breast tenderness    He will continue PRN use    Disposition: F/u with Q 3 mo remotes, in clinic in 1 year, sooner if needed   Current medicines are reviewed at length with the patient today.  The patient did not have any concerns regarding medicines.  Norma Fredrickson, PA-C 09/18/2019 1:49 PM     [facility] [address] [place] [address] [phone] (office)  [phone] (fax)

## 2019-09-18 ENCOUNTER — Ambulatory Visit (INDEPENDENT_AMBULATORY_CARE_PROVIDER_SITE_OTHER): Payer: Self-pay | Admitting: Physician Assistant

## 2019-09-18 ENCOUNTER — Other Ambulatory Visit: Payer: Self-pay

## 2019-09-18 VITALS — BP 128/94 | HR 62 | Ht 71.0 in | Wt 314.0 lb

## 2019-09-18 DIAGNOSIS — Z950 Presence of cardiac pacemaker: Secondary | ICD-10-CM

## 2019-09-18 DIAGNOSIS — I471 Supraventricular tachycardia: Secondary | ICD-10-CM

## 2019-09-18 DIAGNOSIS — I5032 Chronic diastolic (congestive) heart failure: Secondary | ICD-10-CM

## 2019-09-18 DIAGNOSIS — I10 Essential (primary) hypertension: Secondary | ICD-10-CM

## 2019-09-18 NOTE — Patient Instructions (Signed)

## 2019-10-01 ENCOUNTER — Telehealth: Payer: Self-pay | Admitting: Internal Medicine

## 2019-10-01 NOTE — Telephone Encounter (Signed)
Noted  

## 2019-10-01 NOTE — Telephone Encounter (Signed)
   Pt c/o Shortness Of Breath: STAT if SOB developed within the last 24 hours or pt is noticeably SOB on the phone  1. Are you currently SOB (can you hear that pt is SOB on the phone)? Not so much like this morning. he wanted to speak with Dr. Ladona Ridgel or his nurse before going to the hospital  2. How long have you been experiencing SOB? Started this morning  3. Are you SOB when sitting or when up moving around? Mostly when moving around  4. Are you currently experiencing any other symptoms? Feel like ingestion and a little nauseated

## 2019-10-01 NOTE — Telephone Encounter (Signed)
I returned call to patient who states he is now in the ED at [facility] in [place] with shortness of breath

## 2019-10-14 ENCOUNTER — Other Ambulatory Visit: Payer: Self-pay | Admitting: Internal Medicine

## 2019-10-15 ENCOUNTER — Ambulatory Visit (INDEPENDENT_AMBULATORY_CARE_PROVIDER_SITE_OTHER): Payer: Self-pay | Admitting: *Deleted

## 2019-10-15 DIAGNOSIS — R001 Bradycardia, unspecified: Secondary | ICD-10-CM

## 2019-10-16 LAB — CUP PACEART REMOTE DEVICE CHECK
Battery Impedance: 1477 Ohm
Battery Remaining Longevity: 49 mo
Battery Voltage: 2.75 V
Brady Statistic AP VP Percent: 0 %
Brady Statistic AP VS Percent: 0 %
Brady Statistic AS VP Percent: 0 %
Brady Statistic AS VS Percent: 100 %
Date Time Interrogation Session: 20210326102513
Implantable Lead Implant Date: 20060823
Implantable Lead Implant Date: 20060823
Implantable Lead Location: 753859
Implantable Lead Location: 753860
Implantable Lead Model: 5076
Implantable Lead Model: 5076
Implantable Pulse Generator Implant Date: 20130925
Lead Channel Impedance Value: 382 Ohm
Lead Channel Impedance Value: 549 Ohm
Lead Channel Setting Pacing Amplitude: 1.5 V
Lead Channel Setting Pacing Amplitude: 2.5 V
Lead Channel Setting Pacing Pulse Width: 1 ms
Lead Channel Setting Sensing Sensitivity: 2 mV

## 2019-10-21 ENCOUNTER — Telehealth: Payer: Self-pay

## 2019-10-21 MED ORDER — MORPHINE SULFATE 2 MG/ML IJ SOLN
2.00 | INTRAMUSCULAR | Status: DC
Start: ? — End: 2019-10-21

## 2019-10-21 MED ORDER — HYDRALAZINE HCL 20 MG/ML IJ SOLN
10.00 | INTRAMUSCULAR | Status: DC
Start: ? — End: 2019-10-21

## 2019-10-21 MED ORDER — METOPROLOL TARTRATE 25 MG PO TABS
25.00 | ORAL_TABLET | ORAL | Status: DC
Start: 2019-10-20 — End: 2019-10-21

## 2019-10-21 MED ORDER — TRIAMTERENE-HCTZ 37.5-25 MG PO TABS
1.00 | ORAL_TABLET | ORAL | Status: DC
Start: 2019-10-21 — End: 2019-10-21

## 2019-10-21 MED ORDER — FLUTICASONE PROPIONATE 50 MCG/ACT NA SUSP
1.00 | NASAL | Status: DC
Start: 2019-10-20 — End: 2019-10-21

## 2019-10-21 MED ORDER — ASPIRIN 81 MG PO TBEC
81.00 | DELAYED_RELEASE_TABLET | ORAL | Status: DC
Start: 2019-10-21 — End: 2019-10-21

## 2019-10-21 MED ORDER — NITROGLYCERIN 0.4 MG SL SUBL
0.40 | SUBLINGUAL_TABLET | SUBLINGUAL | Status: DC
Start: ? — End: 2019-10-21

## 2019-10-21 MED ORDER — PANTOPRAZOLE SODIUM 40 MG PO TBEC
40.00 | DELAYED_RELEASE_TABLET | ORAL | Status: DC
Start: 2019-10-21 — End: 2019-10-21

## 2019-10-21 NOTE — Telephone Encounter (Signed)
Spoke with pt regarding his wife attending his appt on 11/17/19 with Dr Ladona Ridgel. Pt was advised that he is allowed one visitor only if he is mentally or physically disabled. Pt stated he is neither, and was informed that his wife would not be able to come up with him. Pt was advised to call wife at the time of the appt so she can join over the phone. Pt agreed. Pt was advised to call if he has any further questions regarding his appt.

## 2019-11-17 ENCOUNTER — Ambulatory Visit: Payer: Self-pay | Admitting: Internal Medicine

## 2019-11-17 ENCOUNTER — Other Ambulatory Visit: Payer: Self-pay

## 2019-11-17 ENCOUNTER — Encounter: Payer: Self-pay | Admitting: Internal Medicine

## 2019-11-17 VITALS — BP 144/88 | HR 70 | Ht 71.0 in

## 2019-11-17 DIAGNOSIS — Z950 Presence of cardiac pacemaker: Secondary | ICD-10-CM

## 2019-11-17 DIAGNOSIS — I10 Essential (primary) hypertension: Secondary | ICD-10-CM

## 2019-11-17 DIAGNOSIS — R001 Bradycardia, unspecified: Secondary | ICD-10-CM

## 2019-11-17 LAB — CUP PACEART INCLINIC DEVICE CHECK
Battery Impedance: 1534 Ohm
Battery Remaining Longevity: 47 mo
Battery Voltage: 2.75 V
Brady Statistic AP VP Percent: 0 %
Brady Statistic AP VS Percent: 0 %
Brady Statistic AS VP Percent: 0 %
Brady Statistic AS VS Percent: 100 %
Date Time Interrogation Session: 20210427094900
Implantable Lead Implant Date: 20060823
Implantable Lead Implant Date: 20060823
Implantable Lead Location: 753859
Implantable Lead Location: 753860
Implantable Lead Model: 5076
Implantable Lead Model: 5076
Implantable Pulse Generator Implant Date: 20130925
Lead Channel Impedance Value: 372 Ohm
Lead Channel Impedance Value: 525 Ohm
Lead Channel Pacing Threshold Amplitude: 0.75 V
Lead Channel Pacing Threshold Amplitude: 1.75 V
Lead Channel Pacing Threshold Pulse Width: 0.4 ms
Lead Channel Pacing Threshold Pulse Width: 1 ms
Lead Channel Sensing Intrinsic Amplitude: 1.4 mV
Lead Channel Sensing Intrinsic Amplitude: 4 mV
Lead Channel Setting Pacing Amplitude: 1.5 V
Lead Channel Setting Pacing Amplitude: 2.5 V
Lead Channel Setting Pacing Pulse Width: 1 ms
Lead Channel Setting Sensing Sensitivity: 2 mV

## 2019-11-17 NOTE — Patient Instructions (Signed)

## 2019-11-17 NOTE — Progress Notes (Signed)
HPI Mr. Brosnahan returns today for followup. He is a pleasant morbidly obese man with chronic systolic heart failure, chest pain and palpitations. He has a h/o sinus bradycardia, s/p PPM insertion in [place]. He has been sedentary but denies dietary indiscretion. He c/o early satiety and is pending a visit to the GI service. He has gained almost 30 lbs. He does not have peripheral edema. He has class 2 diastolic dysfunction. He is not exercising.  Allergies  Allergen Reactions  . Lisinopril-Hydrochlorothiazide Shortness Of Breath    Felt like he was going to have a MI  . Lactase Nausea And Vomiting  . Lactose Intolerance (Gi) Nausea And Vomiting  . Lactulose Nausea And Vomiting  . Pork-Derived Products Hives, Nausea And Vomiting and Other (See Comments)     headache     Current Outpatient Medications  Medication Sig Dispense Refill  . aspirin 81 MG chewable tablet Chew 81 mg by mouth daily.    . diclofenac Sodium (VOLTAREN) 1 % GEL Apply 1 application topically as needed for pain.    . fluticasone (FLONASE) 50 MCG/ACT nasal spray Place 1 spray into the nose as needed for allergies.    . metoprolol tartrate (LOPRESSOR) 50 MG tablet TAKE 1 TABLET BY MOUTH 4 TIMES DAILY AS NEEDED FOR  PALPITATIONS 360 tablet 0  . omeprazole (PRILOSEC) 40 MG capsule Take 40 mg by mouth daily.    Marland Kitchen triamterene-hydrochlorothiazide (MAXZIDE-25) 37.5-25 MG tablet Take 1 tablet by mouth once daily 90 tablet 3   No current facility-administered medications for this visit.     Past Medical History:  Diagnosis Date  . Barrett's esophagus without dysplasia - subcentimeter changes 06/19/2015  . Chest pain    normal LHC 2008;  ETT-Echo 8/10: normal  . 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 Diastolic heart failure (HCC)   . Gastroparesis 08/19/2015  . GERD (gastroesophageal reflux disease)   . Hypertension   . Obesity   . OSA (obstructive sleep apnea)   . Other specified cardiac dysrhythmias(427.89)    . Sleep apnea   . SVT (supraventricular tachycardia) (HCC)   . Symptomatic bradycardia    s/p pacer in 2006 in [place]    ROS:   All systems reviewed and negative except as noted in the HPI.   Past Surgical History:  Procedure Laterality Date  . APPENDECTOMY  1994  . COLONOSCOPY    . INSERT / REPLACE / REMOVE PACEMAKER     status post pacemaker implant August 2006  . PERMANENT PACEMAKER GENERATOR CHANGE N/A 04/16/2012   Procedure: PERMANENT PACEMAKER GENERATOR CHANGE;  Surgeon: Marinus Maw, MD;  [facility] LAB;  Service: Cardiovascular;  Laterality: N/A;  . UPPER GASTROINTESTINAL ENDOSCOPY       Family History  Problem Relation Age of Onset  . Hypertension Maternal Grandmother   . Hypertension Maternal Grandfather   . Hypertension Paternal Grandmother   . Heart disease Paternal Grandfather   . Hypertension Paternal Grandfather   . Prostate cancer Paternal Grandfather   . Healthy Mother   . Healthy Father   . Hypertension Other   . Diabetes Other   . Cancer Other   . CAD Other   . Pancreatic cancer Paternal Uncle      Social History   Socioeconomic History  . Marital status: Single    Spouse name: Not on file  . Number of children: Not on file  . Years of education: Not on file  . Highest education  level: Not on file  Occupational History  . Occupation: truck Geophysicist/field seismologist  Tobacco Use  . Smoking status: Never Smoker  . Smokeless tobacco: Never Used  Substance and Sexual Activity  . Alcohol use: Yes    Alcohol/week: 0.0 standard drinks    Comment: occasional on the weekends (2-3 drinks, liquor and beer)  . Drug use: No  . Sexual activity: Yes  Other Topics Concern  . Not on file  Social History Narrative   Tobacco history none. He does admit to using recreational drugs in the past. He has not smoked marijuana in 7 years.    He is currently working as a Administrator, He has 6 siblings all in good health. Both of his parents are in there 71 s and are   in good health.  Lives alone in a one story home.  Has 6 children.     Education: college.   Social Determinants of Health   Financial Resource Strain:   . Difficulty of Paying Living Expenses:   Food Insecurity:   . Worried About Charity fundraiser in the Last Year:   . Arboriculturist in the Last Year:   Transportation Needs:   . Film/video editor (Medical):   Marland Kitchen Lack of Transportation (Non-Medical):   Physical Activity:   . Days of Exercise per Week:   . Minutes of Exercise per Session:   Stress:   . Feeling of Stress :   Social Connections:   . Frequency of Communication with Friends and Family:   . Frequency of Social Gatherings with Friends and Family:   . Attends Religious Services:   . Active Member of Clubs or Organizations:   . Attends Archivist Meetings:   Marland Kitchen Marital Status:   Intimate Partner Violence:   . Fear of Current or Ex-Partner:   . Emotionally Abused:   Marland Kitchen Physically Abused:   . Sexually Abused:      BP (!) 144/88   Pulse 70   Ht 5\' 11"  (1.803 m)   SpO2 98%   BMI 43.79 kg/m   Physical Exam:  Morbidly obese appearing NAD HEENT: Unremarkable Neck:  No JVD, no thyromegally Lymphatics:  No adenopathy Back:  No CVA tenderness Lungs:  Clear with no wheezes HEART:  Regular rate rhythm, no murmurs, no rubs, no clicks Abd:  soft, positive bowel sounds, no organomegally, no rebound, no guarding Ext:  2 plus pulses, no edema, no cyanosis, no clubbing Skin:  No rashes no nodules Neuro:  CN II through XII intact, motor grossly intact  EKG - nsr DEVICE  Normal device function.  See PaceArt for details.   Assess/Plan: 1. Diastolic heart failure - his symptoms are class 2. We discussed the importance of weight loss and exercise and avoidance of salty food. 2. HTN -her bp is up a bit. I do not think it makes sense to increase his meds.  3. Obesity - he has gained 30 lbs in the last 2 months. He does not have rales or peripheral edema. He  denies dietary indiscretion. 4. PPM - his sinus node dysfunction is well controlled. He is pacing minimally.  Mikle Bosworth.D.

## 2020-01-14 ENCOUNTER — Ambulatory Visit (INDEPENDENT_AMBULATORY_CARE_PROVIDER_SITE_OTHER): Payer: Self-pay | Admitting: *Deleted

## 2020-01-14 DIAGNOSIS — R001 Bradycardia, unspecified: Secondary | ICD-10-CM

## 2020-01-15 LAB — CUP PACEART REMOTE DEVICE CHECK
Battery Impedance: 1590 Ohm
Battery Remaining Longevity: 46 mo
Battery Voltage: 2.74 V
Brady Statistic AP VP Percent: 0 %
Brady Statistic AP VS Percent: 0 %
Brady Statistic AS VP Percent: 0 %
Brady Statistic AS VS Percent: 100 %
Date Time Interrogation Session: 20210624194338
Implantable Lead Implant Date: 20060823
Implantable Lead Implant Date: 20060823
Implantable Lead Location: 753859
Implantable Lead Location: 753860
Implantable Lead Model: 5076
Implantable Lead Model: 5076
Implantable Pulse Generator Implant Date: 20130925
Lead Channel Impedance Value: 387 Ohm
Lead Channel Impedance Value: 533 Ohm
Lead Channel Setting Pacing Amplitude: 1.5 V
Lead Channel Setting Pacing Amplitude: 2.5 V
Lead Channel Setting Pacing Pulse Width: 1 ms
Lead Channel Setting Sensing Sensitivity: 2 mV

## 2020-01-18 NOTE — Progress Notes (Signed)
Remote pacemaker transmission.   

## 2020-02-10 ENCOUNTER — Telehealth: Payer: Self-pay | Admitting: Internal Medicine

## 2020-02-10 NOTE — Telephone Encounter (Signed)
   Primary Cardiologist: [name], MD  Chart reviewed as part of pre-operative protocol coverage. Given past medical history and time since last visit, based on ACC/AHA guidelines, Jim Hill would be at acceptable risk for the planned procedure without further cardiovascular testing.   I will route this recommendation to the requesting party via Epic fax function and remove from pre-op pool.  Please call with questions.  Thomasene Ripple. Lavonne Cass NP-C    02/10/2020, 2:03 PM [facility] [address] Office [phone] Fax [phone]

## 2020-02-10 NOTE — Telephone Encounter (Signed)
   [facility] Pre-operative Risk Assessment    Request for surgical clearance:  1. What type of surgery is being performed? Robotic LAP Incisional Hernia   2. When is this surgery scheduled? TBD   3. What type of clearance is required (medical clearance vs. Pharmacy clearance to hold med vs. Both)? Medical Clearance  4. Are there any medications that need to be held prior to surgery and how long? No  5. Practice name and name of physician performing surgery? [facility]   Dr. Eather Colas  6. What is your office phone number? [phone]   7.   What is your office fax number? [phone]  8.   Anesthesia type (None, local, MAC, general) ? General   Zara Council 02/10/2020, 12:55 PM  _________________________________________________________________   (provider comments below)

## 2020-04-11 ENCOUNTER — Telehealth: Payer: Self-pay | Admitting: Internal Medicine

## 2020-04-11 ENCOUNTER — Ambulatory Visit (INDEPENDENT_AMBULATORY_CARE_PROVIDER_SITE_OTHER): Payer: BLUE CROSS/BLUE SHIELD | Admitting: Internal Medicine

## 2020-04-11 ENCOUNTER — Encounter: Payer: Self-pay | Admitting: Internal Medicine

## 2020-04-11 ENCOUNTER — Other Ambulatory Visit: Payer: Self-pay

## 2020-04-11 VITALS — BP 138/72 | HR 72 | Wt 341.6 lb

## 2020-04-11 DIAGNOSIS — Z950 Presence of cardiac pacemaker: Secondary | ICD-10-CM

## 2020-04-11 DIAGNOSIS — I495 Sick sinus syndrome: Secondary | ICD-10-CM

## 2020-04-11 DIAGNOSIS — I10 Essential (primary) hypertension: Secondary | ICD-10-CM

## 2020-04-11 DIAGNOSIS — I5032 Chronic diastolic (congestive) heart failure: Secondary | ICD-10-CM

## 2020-04-11 LAB — CUP PACEART INCLINIC DEVICE CHECK
Battery Impedance: 1849 Ohm
Battery Remaining Longevity: 40 mo
Battery Voltage: 2.74 V
Brady Statistic AP VP Percent: 0 %
Brady Statistic AP VS Percent: 0 %
Brady Statistic AS VP Percent: 0 %
Brady Statistic AS VS Percent: 100 %
Brady Statistic RA Percent Paced: 0.1 % — CL
Brady Statistic RV Percent Paced: 0.1 % — CL
Date Time Interrogation Session: 20210920100417
Implantable Lead Implant Date: 20060823
Implantable Lead Implant Date: 20060823
Implantable Lead Location: 753859
Implantable Lead Location: 753860
Implantable Lead Model: 5076
Implantable Lead Model: 5076
Implantable Pulse Generator Implant Date: 20130925
Lead Channel Impedance Value: 398 Ohm
Lead Channel Impedance Value: 556 Ohm
Lead Channel Pacing Threshold Amplitude: 0.5 V
Lead Channel Pacing Threshold Amplitude: 2 V
Lead Channel Pacing Threshold Pulse Width: 0.4 ms
Lead Channel Pacing Threshold Pulse Width: 1 ms
Lead Channel Sensing Intrinsic Amplitude: 2 mV
Lead Channel Sensing Intrinsic Amplitude: 4 mV
Lead Channel Setting Pacing Amplitude: 1.5 V
Lead Channel Setting Pacing Amplitude: 3.5 V
Lead Channel Setting Pacing Pulse Width: 1 ms
Lead Channel Setting Sensing Sensitivity: 2 mV

## 2020-04-11 NOTE — Patient Instructions (Signed)

## 2020-04-11 NOTE — Progress Notes (Signed)
HPI [name] returns for followup. He is a morbidly obese 49 yo man with symptomatic brady, s/p PPM, h/o SVT s/p 2 ablations elsewhere, HTN, and non-cardiac chest pain. He has had some palpitations with exertion which are probably sinus tachycardia.  Allergies  Allergen Reactions  . Lisinopril-Hydrochlorothiazide Shortness Of Breath    Felt like he was going to have a MI  . Lactase Nausea And Vomiting  . Lactose Intolerance (Gi) Nausea And Vomiting  . Lactulose Nausea And Vomiting  . Pork-Derived Products Hives, Nausea And Vomiting and Other (See Comments)     headache     Current Outpatient Medications  Medication Sig Dispense Refill  . aspirin 81 MG chewable tablet Chew 81 mg by mouth daily.    . diclofenac Sodium (VOLTAREN) 1 % GEL Apply 1 application topically as needed for pain.    . fluticasone (FLONASE) 50 MCG/ACT nasal spray Place 1 spray into the nose as needed for allergies.    . metoprolol tartrate (LOPRESSOR) 50 MG tablet TAKE 1 TABLET BY MOUTH 4 TIMES DAILY AS NEEDED FOR  PALPITATIONS 360 tablet 0  . omeprazole (PRILOSEC) 40 MG capsule Take 40 mg by mouth daily.    Marland Kitchen triamterene-hydrochlorothiazide (MAXZIDE-25) 37.5-25 MG tablet Take 1 tablet by mouth once daily 90 tablet 3   No current facility-administered medications for this visit.     Past Medical History:  Diagnosis Date  . Barrett's esophagus without dysplasia - subcentimeter changes 06/19/2015  . Chest pain    normal LHC 2008;  ETT-Echo 8/10: normal  . 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 Diastolic heart failure (HCC)   . Gastroparesis 08/19/2015  . GERD (gastroesophageal reflux disease)   . Hypertension   . Obesity   . OSA (obstructive sleep apnea)   . Other specified cardiac dysrhythmias(427.89)   . Sleep apnea   . SVT (supraventricular tachycardia) (HCC)   . Symptomatic bradycardia    s/p pacer in 2006 in [place]    ROS:   All systems reviewed and negative except as noted in the  HPI.   Past Surgical History:  Procedure Laterality Date  . APPENDECTOMY  1994  . COLONOSCOPY    . INSERT / REPLACE / REMOVE PACEMAKER     status post pacemaker implant August 2006  . PERMANENT PACEMAKER GENERATOR CHANGE N/A 04/16/2012   Procedure: PERMANENT PACEMAKER GENERATOR CHANGE;  Surgeon: Marinus Maw, MD;  Location: [facility] CATH LAB;  Service: Cardiovascular;  Laterality: N/A;  . UPPER GASTROINTESTINAL ENDOSCOPY       Family History  Problem Relation Age of Onset  . Hypertension Maternal Grandmother   . Hypertension Maternal Grandfather   . Hypertension Paternal Grandmother   . Heart disease Paternal Grandfather   . Hypertension Paternal Grandfather   . Prostate cancer Paternal Grandfather   . Healthy Mother   . Healthy Father   . Hypertension Other   . Diabetes Other   . Cancer Other   . CAD Other   . Pancreatic cancer Paternal Uncle      Social History   Socioeconomic History  . Marital status: Single    Spouse name: Not on file  . Number of children: Not on file  . Years of education: Not on file  . Highest education level: Not on file  Occupational History  . Occupation: truck Hospital doctor  Tobacco Use  . Smoking status: Never Smoker  . Smokeless tobacco: Never Used  Vaping Use  . Vaping Use: Never  used  Substance and Sexual Activity  . Alcohol use: Yes    Alcohol/week: 0.0 standard drinks    Comment: occasional on the weekends (2-3 drinks, liquor and beer)  . Drug use: No  . Sexual activity: Yes  Other Topics Concern  . Not on file  Social History Narrative   Tobacco history none. He does admit to using recreational drugs in the past. He has not smoked marijuana in 7 years.    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 Both of his parents are in there 50 s and are  in good health.  Lives alone in a one story home.  Has 6 children.     Education: college.   Social Determinants of Health   Financial Resource Strain:    . Difficulty of Paying Living Expenses: Not on file  Food Insecurity:   . Worried About Programme researcher, broadcasting/film/video in the Last Year: Not on file  . Ran Out of Food in the Last Year: Not on file  Transportation Needs:   . Lack of Transportation (Medical): Not on file  . Lack of Transportation (Non-Medical): Not on file  Physical Activity:   . Days of Exercise per Week: Not on file  . Minutes of Exercise per Session: Not on file  Stress:   . Feeling of Stress : Not on file  Social Connections:   . Frequency of Communication with Friends and Family: Not on file  . Frequency of Social Gatherings with Friends and Family: Not on file  . Attends Religious Services: Not on file  . Active Member of Clubs or Organizations: Not on file  . Attends Banker Meetings: Not on file  . Marital Status: Not on file  Intimate Partner Violence:   . Fear of Current or Ex-Partner: Not on file  . Emotionally Abused: Not on file  . Physically Abused: Not on file  . Sexually Abused: Not on file     BP 138/72   Pulse 72   Wt (!) 341 lb 9.6 oz (154.9 kg)   SpO2 98%   BMI 47.64 kg/m   Physical Exam:  Well appearing NAD HEENT: Unremarkable Neck:  No JVD, no thyromegally Lymphatics:  No adenopathy Back:  No CVA tenderness Lungs:  Clear with no wheezes HEART:  Regular rate rhythm, no murmurs, no rubs, no clicks Abd:  soft, positive bowel sounds, no organomegally, no rebound, no guarding Ext:  2 plus pulses, no edema, no cyanosis, no clubbing Skin:  No rashes no nodules Neuro:  CN II through XII intact, motor grossly intact  EKG - nsr  DEVICE  Normal device function.  See PaceArt for details.   Assess/Plan: 1. Morbid obesity - I discussed different dietary approaches. He is encouraged to fast for 18 hours a day. I explained the justification. He is not diabetic. 2. HTN - his bp is minimally elevated. He is encouraged to avoid salty foods. 3. PPM - we turned up his RV output today. He  is only pacing in the atria and minimally so. 4. Chest pain - none cardiac in nature, non-exertional. No additional work up for now.  Dorathy Daft.

## 2020-04-11 NOTE — Telephone Encounter (Signed)
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 He wanted to know if the Nurse could send that to him via MyChart. Please advise

## 2020-04-13 NOTE — Telephone Encounter (Signed)
DOT letter sent via email.

## 2020-04-25 ENCOUNTER — Ambulatory Visit (INDEPENDENT_AMBULATORY_CARE_PROVIDER_SITE_OTHER): Payer: Self-pay

## 2020-04-25 DIAGNOSIS — I495 Sick sinus syndrome: Secondary | ICD-10-CM

## 2020-04-25 LAB — CUP PACEART REMOTE DEVICE CHECK
Battery Impedance: 1820 Ohm
Battery Remaining Longevity: 42 mo
Battery Voltage: 2.73 V
Brady Statistic AP VP Percent: 0 %
Brady Statistic AP VS Percent: 0 %
Brady Statistic AS VP Percent: 0 %
Brady Statistic AS VS Percent: 100 %
Date Time Interrogation Session: 20211004000039
Implantable Lead Implant Date: 20060823
Implantable Lead Implant Date: 20060823
Implantable Lead Location: 753859
Implantable Lead Location: 753860
Implantable Lead Model: 5076
Implantable Lead Model: 5076
Implantable Pulse Generator Implant Date: 20130925
Lead Channel Impedance Value: 392 Ohm
Lead Channel Impedance Value: 551 Ohm
Lead Channel Setting Pacing Amplitude: 1.5 V
Lead Channel Setting Pacing Amplitude: 3.5 V
Lead Channel Setting Pacing Pulse Width: 1 ms
Lead Channel Setting Sensing Sensitivity: 2 mV

## 2020-04-26 NOTE — Progress Notes (Signed)
Remote pacemaker transmission.   

## 2020-05-27 ENCOUNTER — Ambulatory Visit: Payer: BLUE CROSS/BLUE SHIELD | Admitting: Internal Medicine

## 2020-07-25 ENCOUNTER — Ambulatory Visit (INDEPENDENT_AMBULATORY_CARE_PROVIDER_SITE_OTHER): Payer: Self-pay

## 2020-07-25 DIAGNOSIS — I495 Sick sinus syndrome: Secondary | ICD-10-CM

## 2020-07-28 LAB — CUP PACEART REMOTE DEVICE CHECK
Battery Impedance: 1909 Ohm
Battery Remaining Longevity: 40 mo
Battery Voltage: 2.73 V
Brady Statistic AP VP Percent: 0 %
Brady Statistic AP VS Percent: 0 %
Brady Statistic AS VP Percent: 0 %
Brady Statistic AS VS Percent: 100 %
Date Time Interrogation Session: 20220105205417
Implantable Lead Implant Date: 20060823
Implantable Lead Implant Date: 20060823
Implantable Lead Location: 753859
Implantable Lead Location: 753860
Implantable Lead Model: 5076
Implantable Lead Model: 5076
Implantable Pulse Generator Implant Date: 20130925
Lead Channel Impedance Value: 377 Ohm
Lead Channel Impedance Value: 528 Ohm
Lead Channel Setting Pacing Amplitude: 1.5 V
Lead Channel Setting Pacing Amplitude: 3.5 V
Lead Channel Setting Pacing Pulse Width: 1 ms
Lead Channel Setting Sensing Sensitivity: 2 mV

## 2020-08-09 NOTE — Progress Notes (Signed)
Remote pacemaker transmission.   

## 2020-09-12 ENCOUNTER — Telehealth: Payer: Self-pay | Admitting: Emergency Medicine

## 2020-09-12 MED ORDER — METOPROLOL TARTRATE 50 MG PO TABS: ORAL_TABLET | ORAL | 0 refills | Status: DC

## 2020-09-12 NOTE — Telephone Encounter (Signed)
Patient called and was concerned about a message he received in his My Chart showing that on 07/27/2020, his remote transmission showed 10 ventricular arrhythmias. Patient was concerned if he needed to change any medications and inquired as to why he did not receive a call from the Device [facility]. Patient was advised that the episodes were short in duration and that he had a history of ventricular arrhythmias (no new observations). Patient advised to continue his medication as directed by physician. Patient also reassured that the Device Clinic monitors transmissions closely and if there are concerns, that he will be contacted in a timely manner.

## 2020-09-12 NOTE — Telephone Encounter (Signed)
Refill sent as requested. 

## 2020-09-12 NOTE — Addendum Note (Signed)
Addended by: Roney Mans A on: 09/12/2020 03:24 PM   Modules accepted: Orders

## 2020-09-12 NOTE — Telephone Encounter (Signed)
Patient states that he is out of his metoprolol 50 mg tablets and has no refills. Patient requesting a refill. Advised patient that I would route this to Dr. Ladona Ridgel.

## 2020-10-04 ENCOUNTER — Telehealth: Payer: Self-pay | Admitting: Internal Medicine

## 2020-10-04 NOTE — Telephone Encounter (Signed)
New message:    Patient calling stating that he need a med form DOD. Patient would like to speak with a nurse.

## 2020-10-04 NOTE — Telephone Encounter (Signed)
RN returned call to patient regarding medication form. Patient states he is needing the yearly DOT form that Dr. Ladona Ridgel gives him for his physical in order to be cleared by the DOT. RN informed patient that RN would send Dr. Lubertha Basque nurse a message to assist him with completion. Patient verbalized understanding. RN routed call to Roney Mans, RN.

## 2020-10-04 NOTE — Telephone Encounter (Signed)
Letter emailed to Pt per request.  Pt has received.  No further action needed.

## 2020-10-04 NOTE — Telephone Encounter (Signed)
Letter created.  See mychart message.

## 2020-11-13 ENCOUNTER — Encounter: Payer: Self-pay | Admitting: Internal Medicine

## 2020-11-28 ENCOUNTER — Telehealth: Payer: Self-pay | Admitting: Student

## 2020-11-28 NOTE — Telephone Encounter (Signed)
Newmessage      Patient calling stating that he was in the ER on 11/27/20 per ER patient need a order for a ECHO

## 2020-11-28 NOTE — Telephone Encounter (Signed)
Can you please schedule him an appt soon for a hospital f/u with Dr Ladona Ridgel or an APP.  Thanks ~Cliff Damiani G.

## 2020-12-13 ENCOUNTER — Encounter: Payer: Self-pay | Admitting: Internal Medicine

## 2020-12-13 ENCOUNTER — Ambulatory Visit (INDEPENDENT_AMBULATORY_CARE_PROVIDER_SITE_OTHER): Payer: Medicaid Other | Admitting: Internal Medicine

## 2020-12-13 ENCOUNTER — Telehealth: Payer: Self-pay

## 2020-12-13 ENCOUNTER — Other Ambulatory Visit: Payer: Self-pay

## 2020-12-13 VITALS — BP 138/82 | HR 62 | Ht 71.0 in | Wt 345.6 lb

## 2020-12-13 DIAGNOSIS — Z950 Presence of cardiac pacemaker: Secondary | ICD-10-CM

## 2020-12-13 DIAGNOSIS — I495 Sick sinus syndrome: Secondary | ICD-10-CM

## 2020-12-13 DIAGNOSIS — I10 Essential (primary) hypertension: Secondary | ICD-10-CM | POA: Diagnosis not present

## 2020-12-13 NOTE — Patient Instructions (Signed)
Medication Instructions:  Your physician recommends that you continue on your current medications as directed. Please refer to the Current Medication list given to you today.  Labwork: None ordered.  Testing/Procedures: None ordered.  Follow-Up: Your physician wants you to follow-up in: one year with Gregg Taylor, MD or one of the following Advanced Practice Providers on your designated Care Team:    Amber Seiler, NP  Renee Ursuy, PA-C  Michael "Andy" Tillery, PA-C  Remote monitoring is used to monitor your Pacemaker from home. This monitoring reduces the number of office visits required to check your device to one time per year. It allows us to keep an eye on the functioning of your device to ensure it is working properly. You are scheduled for a device check from home on 04/24/2021. You may send your transmission at any time that day. If you have a wireless device, the transmission will be sent automatically. After your physician reviews your transmission, you will receive a postcard with your next transmission date.  Any Other Special Instructions Will Be Listed Below (If Applicable).  If you need a refill on your cardiac medications before your next appointment, please call your pharmacy.       

## 2020-12-13 NOTE — Telephone Encounter (Signed)
-----   Message from Wiliam Ke, RN sent at 12/13/2020 11:05 AM EDT ----- Regarding: will you check his remote schedule to make sure it's correct?

## 2020-12-13 NOTE — Progress Notes (Signed)
HPI Jim Hill returns today for ongoing evaluation and management of morbid obesity, HTN, tachy-brady syndrome. He still has occaisional palpitations. He has done some walking. He has been unable to lose weight. He is up 4 lbs from his last visit in September. He admits to dietary indiscretion. He has had some peripheral edema.  Allergies  Allergen Reactions  . Lisinopril-Hydrochlorothiazide Shortness Of Breath    Felt like he was going to have a MI  . Lactase Nausea And Vomiting  . Lactose Intolerance (Gi) Nausea And Vomiting  . Lactulose Nausea And Vomiting  . Pork-Derived Products Hives, Nausea And Vomiting and Other (See Comments)     headache     Current Outpatient Medications  Medication Sig Dispense Refill  . aspirin 81 MG chewable tablet Chew 81 mg by mouth daily.    . metoprolol tartrate (LOPRESSOR) 50 MG tablet TAKE 1 TABLET BY MOUTH 4 TIMES DAILY AS NEEDED FOR  PALPITATIONS 360 tablet 0  . omeprazole (PRILOSEC) 40 MG capsule Take 40 mg by mouth daily.    Marland Kitchen triamterene-hydrochlorothiazide (MAXZIDE-25) 37.5-25 MG tablet Take 1 tablet by mouth once daily 90 tablet 3   No current facility-administered medications for this visit.     Past Medical History:  Diagnosis Date  . Barrett's esophagus without dysplasia - subcentimeter changes 06/19/2015  . Chest pain    normal LHC 2008;  ETT-Echo 8/10: normal  . 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 Diastolic heart failure (HCC)   . Gastroparesis 08/19/2015  . GERD (gastroesophageal reflux disease)   . Hypertension   . Obesity   . OSA (obstructive sleep apnea)   . Other specified cardiac dysrhythmias(427.89)   . Sleep apnea   . SVT (supraventricular tachycardia) (HCC)   . Symptomatic bradycardia    s/p pacer in 2006 in [place]    ROS:   All systems reviewed and negative except as noted in the HPI.   Past Surgical History:  Procedure Laterality Date  . APPENDECTOMY  1994  . COLONOSCOPY    . INSERT /  REPLACE / REMOVE PACEMAKER     status post pacemaker implant August 2006  . PERMANENT PACEMAKER GENERATOR CHANGE N/A 04/16/2012   Procedure: PERMANENT PACEMAKER GENERATOR CHANGE;  Surgeon: Marinus Maw, MD;  Location: [facility] CATH LAB;  Service: Cardiovascular;  Laterality: N/A;  . UPPER GASTROINTESTINAL ENDOSCOPY       Family History  Problem Relation Age of Onset  . Hypertension Maternal Grandmother   . Hypertension Maternal Grandfather   . Hypertension Paternal Grandmother   . Heart disease Paternal Grandfather   . Hypertension Paternal Grandfather   . Prostate cancer Paternal Grandfather   . Healthy Mother   . Healthy Father   . Hypertension Other   . Diabetes Other   . Cancer Other   . CAD Other   . Pancreatic cancer Paternal Uncle      Social History   Socioeconomic History  . Marital status: Single    Spouse name: Not on file  . Number of children: Not on file  . Years of education: Not on file  . Highest education level: Not on file  Occupational History  . Occupation: truck Hospital doctor  Tobacco Use  . Smoking status: Never Smoker  . Smokeless tobacco: Never Used  Vaping Use  . Vaping Use: Never used  Substance and Sexual Activity  . Alcohol use: Yes    Alcohol/week: 0.0 standard drinks    Comment: occasional on the  weekends (2-3 drinks, liquor and beer)  . Drug use: No  . Sexual activity: Yes  Other Topics Concern  . Not on file  Social History Narrative   Tobacco history none. He does admit to using recreational drugs in the past. He has not smoked marijuana in 7 years.    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 Both of his parents are in there 50 s and are  in good health.  Lives alone in a one story home.  Has 6 children.     Education: college.   Social Determinants of Health   Financial Resource Strain: Not on file  Food Insecurity: Not on file  Transportation Needs: Not on file  Physical Activity: Not on file   Stress: Not on file  Social Connections: Not on file  Intimate Partner Violence: Not on file     BP 138/82   Pulse 62   Ht 5\' 11"  (1.803 m)   Wt (!) 345 lb 9.6 oz (156.8 kg)   SpO2 97%   BMI 48.20 kg/m   Physical Exam:  Well appearing NAD HEENT: Unremarkable Neck:  No JVD, no thyromegally Lymphatics:  No adenopathy Back:  No CVA tenderness Lungs:  Clear with no wheezes HEART:  Regular rate rhythm, no murmurs, no rubs, no clicks Abd:  soft, positive bowel sounds, no organomegally, no rebound, no guarding Ext:  2 plus pulses, no edema, no cyanosis, no clubbing Skin:  No rashes no nodules Neuro:  CN II through XII intact, motor grossly intact  EKG - nsr  DEVICE  Normal device function.  See PaceArt for details.   Assess/Plan: 1. 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 Interrogation demonstrates sinus tachy vs AT.  2. PPM - his medtronic DDD PM is working normally. His RV threshold is a bit elevated but stable. Real question is whether we should not place a new PPM when he reaches ERI. He has paced less than 0.1% of the time. 3. Obesity - we discussed the importance of weight loss and intermittent fasting. 4. HTN - his sbp is minimally elevated. We will follow.  Alaiya Martindelcampo,MD

## 2020-12-13 NOTE — Telephone Encounter (Signed)
LMOVM for patient to send an updated transmission. He has not sent one since 07-27-2020.

## 2020-12-21 NOTE — Telephone Encounter (Signed)
Spoke with patient and he will send remote transmission 12/22/20 when he returns home .

## 2020-12-29 NOTE — Telephone Encounter (Signed)
Pt states he has not been able to locate monitor in storage yet.  He will check again.  Advised if he is not able to locate the monitor please contact us/ medtronic to order a new monitor.  Advised pt we will check in another week to see if tranmission comes through.

## 2021-01-16 NOTE — Telephone Encounter (Signed)
Certified letter sent 01/16/2021. 

## 2021-03-13 ENCOUNTER — Telehealth: Payer: Self-pay | Admitting: Internal Medicine

## 2021-03-13 DIAGNOSIS — R079 Chest pain, unspecified: Secondary | ICD-10-CM

## 2021-03-13 NOTE — Telephone Encounter (Signed)
Pt went to ER on 03/08/2021.  States he went for gas.  ER doctor recommending a stress test.  Advised Pt would discuss with Dr. Ladona Ridgel on Wednesday and call him back.

## 2021-03-13 NOTE — Telephone Encounter (Signed)
Pt c/o of Chest Pain: STAT if CP now or developed within 24 hours  1. Are you having CP right now? no  2. Are you experiencing any other symptoms (ex. SOB, nausea, vomiting, sweating)? Sweating   3. How long have you been experiencing CP? Since last Wednesday   4. Is your CP continuous or coming and going? Comes and goes   5. Have you taken Nitroglycerin? No    ?

## 2021-03-15 NOTE — Telephone Encounter (Signed)
Per Dr. Junie Spencer to order treadmill stress test (POET).  Pt notified of test ordered.  Instructions given and will mail.

## 2021-03-22 ENCOUNTER — Telehealth: Payer: Self-pay | Admitting: Internal Medicine

## 2021-03-22 NOTE — Telephone Encounter (Signed)
Patient is calling wanting to see if Dr. Ladona Ridgel has gotten the chance to review his Stress test results performed in the hospital. Hospital advised him it was abnormal. Please advise.

## 2021-03-22 NOTE — Telephone Encounter (Signed)
Spoke with the pt and per Jenny/ Dr. Ladona Ridgel, pt will need appt to discuss stress test in Care Everywhere and possible Heart Cath... pt is out of town for the weekend and will be back later in the day Monday 03/27/21 and can be seen 03/28/21.Marland KitchenPt put on Dr. Lubertha Basque schedule for 03/28/21.   Unable to place on the DOD schedule that day since also an EP MD and his schedule is full.

## 2021-03-23 ENCOUNTER — Telehealth: Payer: Self-pay | Admitting: Internal Medicine

## 2021-03-23 NOTE — Telephone Encounter (Signed)
   Pt is requesting to r/s his apt close to a weekend so that he can have enough time to see Dr. Ladona Ridgel. Provide next available appt in December. Pt requested to speak with a nurse to request to work him in his schedule again. He said to have the nurse call him back today

## 2021-03-23 NOTE — Telephone Encounter (Signed)
Spoke with EP scheduler who called the pt.  Pt thought the appt he had on Tuesday was for a procedure and he would need time to recover.  Advised the Tuesday appt was to discus the procedure and for it to be scheduled.  Pt states understanding but is requesting a VV with Dr Ladona Ridgel.  Pt aware we will ask but he is going to need an EKG/blood work before the procedure.  Pt aware he will be contacted to be scheduled once this is discussed with Dr Ladona Ridgel or his nurse.

## 2021-03-24 NOTE — Telephone Encounter (Signed)
Pt scheduled with first available gen cards for abnormal stress test-repeated visits to ER for chest pain.

## 2021-03-28 ENCOUNTER — Ambulatory Visit: Payer: Medicaid Other | Admitting: Internal Medicine

## 2021-03-31 ENCOUNTER — Ambulatory Visit: Payer: Medicaid Other | Admitting: Cardiovascular Disease

## 2021-04-13 ENCOUNTER — Other Ambulatory Visit: Payer: Self-pay

## 2021-04-13 ENCOUNTER — Other Ambulatory Visit: Payer: Self-pay | Admitting: Internal Medicine

## 2021-04-13 MED ORDER — TRIAMTERENE-HCTZ 37.5-25 MG PO TABS: 1.0000 | ORAL_TABLET | Freq: Every day | ORAL | 2 refills | Status: DC

## 2021-04-13 MED ORDER — METOPROLOL TARTRATE 50 MG PO TABS: ORAL_TABLET | ORAL | 1 refills | Status: DC

## 2021-04-13 NOTE — Telephone Encounter (Signed)
Pt is requesting a refill on omeprazole 40 mg capsule. Would Dr. Ladona Ridgel like to refill this medication? Please address

## 2021-04-13 NOTE — Telephone Encounter (Signed)
*  STAT* If patient is at the pharmacy, call can be transferred to refill team.   1. Which medications need to be refilled? (please list name of each medication and dose if known)   triamterene-hydrochlorothiazide (MAXZIDE-25) 37.5-25 MG tablet  metoprolol tartrate (LOPRESSOR) 50 MG tablet  2. Which pharmacy/location (including street and city if local pharmacy) is medication to be sent to?  [facility] - [place] ([place]), [place] - [address]  3. Do they need a 30 day or 90 day supply? 90 ds

## 2021-04-13 NOTE — Telephone Encounter (Signed)
*  STAT* If patient is at the pharmacy, call can be transferred to refill team.   1. Which medications need to be refilled? (please list name of each medication and dose if known) omeprazole (PRILOSEC) 40 MG capsule  2. Which pharmacy/location (including street and city if local pharmacy) is medication to be sent to? [facility] - [place] ([place]), [place] - [address]    3. Do they need a 30 day or 90 day supply? 90 ds

## 2021-04-14 NOTE — Telephone Encounter (Signed)
Omeprazole not prescribed by Dr. Ladona Ridgel.  Pt should contact PCP.

## 2021-05-16 ENCOUNTER — Telehealth: Payer: Self-pay | Admitting: Internal Medicine

## 2021-05-16 NOTE — Telephone Encounter (Signed)
Successful telephone encounter to patient to discuss pacemaker settings per his request. Patient states he recently presented to an urgent care facility and the provider was questioning how his device was programmed, specifically LRL. Informed patient that his LRL was 30. Patient states he was told his HR was 28. Per patient he was discharged from urgent care?? Patient is scheduled to see GT Friday 05/19/21. Patient is currently out of town and unable to send remote transmission as he "lost his remote monitor". Attempted to order patient new monitor by confirming patient address however he would not provide address over the telephone. States he will provide it Friday at face to face appointment.

## 2021-05-16 NOTE — Telephone Encounter (Signed)
  1. Has your device fired? no  2. Is you device beeping? no  3. Are you experiencing draining or swelling at device site? no  4. Are you calling to see if we received your device transmission? no  5. Have you passed out? no   Patient calling to discuss his pacemaker settings.  Please route to Device Clinic Pool

## 2021-05-19 ENCOUNTER — Encounter: Payer: Medicaid Other | Admitting: Internal Medicine

## 2021-05-19 ENCOUNTER — Ambulatory Visit (INDEPENDENT_AMBULATORY_CARE_PROVIDER_SITE_OTHER): Payer: Medicaid Other | Admitting: Internal Medicine

## 2021-05-19 ENCOUNTER — Other Ambulatory Visit: Payer: Self-pay

## 2021-05-19 ENCOUNTER — Telehealth: Payer: Self-pay

## 2021-05-19 ENCOUNTER — Encounter: Payer: Self-pay | Admitting: Internal Medicine

## 2021-05-19 VITALS — BP 138/80 | HR 97 | Ht 71.0 in | Wt 351.4 lb

## 2021-05-19 DIAGNOSIS — I495 Sick sinus syndrome: Secondary | ICD-10-CM | POA: Diagnosis not present

## 2021-05-19 DIAGNOSIS — Z950 Presence of cardiac pacemaker: Secondary | ICD-10-CM

## 2021-05-19 DIAGNOSIS — I10 Essential (primary) hypertension: Secondary | ICD-10-CM

## 2021-05-19 NOTE — Patient Instructions (Signed)
Medication Instructions:  °Your physician recommends that you continue on your current medications as directed. Please refer to the Current Medication list given to you today. ° °Labwork: °None ordered. ° °Testing/Procedures: °None ordered. ° °Follow-Up: °Your physician wants you to follow-up in: one year with Gregg Taylor, MD or one of the following Advanced Practice Providers on your designated Care Team:   °Renee Ursuy, PA-C °Michael "Andy" Tillery, PA-C ° °Remote monitoring is used to monitor your Pacemaker from home. This monitoring reduces the number of office visits required to check your device to one time per year. It allows us to keep an eye on the functioning of your device to ensure it is working properly. You are scheduled for a device check from home on 10/23/2021. You may send your transmission at any time that day. If you have a wireless device, the transmission will be sent automatically. After your physician reviews your transmission, you will receive a postcard with your next transmission date. ° °Any Other Special Instructions Will Be Listed Below (If Applicable). ° °If you need a refill on your cardiac medications before your next appointment, please call your pharmacy.  ° ° ° ° ° °

## 2021-05-19 NOTE — Progress Notes (Signed)
HPI Mr. Patmon returns today for ongoing evaluation and management of morbid obesity, HTN, tachy-brady syndrome. He still has occaisional palpitations. He has done some walking. He has been unable to lose weight. He is up 6 lbs from his last visit in September. He admits to dietary indiscretion. He has had some peripheral edema. He notes that his fiance has put an ultimatum on him to lose weight or she is leaving him. He was in the hospital with back pain and got a cath which was negative. No CAD.  Allergies  Allergen Reactions   Lisinopril Hives   Lisinopril-Hydrochlorothiazide Shortness Of Breath    Felt like he was going to have a MI   Lactose Intolerance (Gi) Nausea And Vomiting   Lactulose Nausea And Vomiting   Pork-Derived Products Hives, Nausea And Vomiting and Other (See Comments)     headache   Tilactase Nausea And Vomiting     Current Outpatient Medications  Medication Sig Dispense Refill   aspirin 81 MG chewable tablet Chew 81 mg by mouth daily.     atorvastatin (LIPITOR) 40 MG tablet Take 40 mg by mouth daily.     metoprolol tartrate (LOPRESSOR) 50 MG tablet TAKE 1 TABLET BY MOUTH 4 TIMES DAILY AS NEEDED FOR  PALPITATIONS 360 tablet 1   omeprazole (PRILOSEC) 40 MG capsule Take 1 capsule by mouth daily.     triamterene-hydrochlorothiazide (MAXZIDE-25) 37.5-25 MG tablet Take 1 tablet by mouth daily. 90 tablet 2   No current facility-administered medications for this visit.     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   Past Surgical History:  Procedure Laterality Date   APPENDECTOMY  1994   COLONOSCOPY     INSERT / REPLACE / REMOVE PACEMAKER     status post pacemaker implant August 2006   PERMANENT PACEMAKER GENERATOR CHANGE N/A 04/16/2012   Procedure: PERMANENT PACEMAKER GENERATOR CHANGE;  Surgeon: Marinus Maw, MD;  Location: [facility] CATH LAB;  Service: Cardiovascular;  Laterality: N/A;   UPPER GASTROINTESTINAL ENDOSCOPY       Family History  Problem Relation Age of Onset   Hypertension Maternal Grandmother    Hypertension Maternal Grandfather    Hypertension Paternal Grandmother    Heart disease Paternal Grandfather    Hypertension Paternal Grandfather    Prostate cancer Paternal Grandfather    Healthy Mother    Healthy Father    Hypertension Other    Diabetes Other    Cancer Other    CAD Other    Pancreatic cancer Paternal Uncle      Social History   Socioeconomic History   Marital status: Single    Spouse name: Not on file   Number of children: Not on file   Years of education: Not on file   Highest education level: Not on file  Occupational  History   Occupation: truck Hospital doctor  Tobacco Use   Smoking status: Never   Smokeless tobacco: Never  Vaping Use   Vaping Use: Never used  Substance and Sexual Activity   Alcohol use: Not Currently    Comment: occasional on the weekends (2-3 drinks, liquor and beer)   Drug use: No   Sexual activity: Yes  Other Topics Concern   Not on file  Social History Narrative   Tobacco history none. He does admit to using recreational drugs in the past. He has not smoked marijuana in 7 years.    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 Both of his parents are in there 50 s and are  in good health.  Lives alone in a one story home.  Has 6 children.     Education: college.    Social Determinants of Health   Financial Resource Strain: Not on file  Food Insecurity: Not on file  Transportation Needs: Not on file  Physical Activity: Not on file  Stress: Not on file  Social Connections: Not on file  Intimate Partner Violence: Not on file     BP 138/80   Pulse 97   Ht 5\' 11"  (1.803 m)   Wt (!) 351 lb 6.4 oz (159.4 kg)   SpO2 98%   BMI 49.01 kg/m   Physical Exam:  Morbidly obese appearing NAD HEENT: Unremarkable Neck:  No JVD, no thyromegally Lymphatics:  No adenopathy Back:  No CVA tenderness Lungs:  Clear with no wheezes HEART:  Regular rate rhythm, no murmurs, no rubs, no clicks Abd:  soft, positive bowel sounds, no organomegally, no rebound, no guarding Ext:  2 plus pulses, no edema, no cyanosis, no clubbing Skin:  No rashes no nodules Neuro:  CN II through XII intact, motor grossly intact  DEVICE  Normal device function.  See PaceArt for details.   Assess/Plan:  1. 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 Interrogation demonstrates sinus tachy vs AT.  2. PPM - his medtronic DDD PM is working normally. His RV threshold is a bit elevated but stable. Real question is whether we should not place a new PPM when he reaches ERI. He has paced less than 0.1% of the time. He is programmed VVI 30/min. 3. Obesity - we discussed the importance of weight loss and intermittent fasting. 4. HTN - his sbp is minimally elevated. We will follow.   Ekam Bonebrake,MD

## 2021-05-19 NOTE — Telephone Encounter (Signed)
Monitored ordered 

## 2021-08-01 ENCOUNTER — Ambulatory Visit (INDEPENDENT_AMBULATORY_CARE_PROVIDER_SITE_OTHER): Payer: Medicaid Other

## 2021-08-01 DIAGNOSIS — I495 Sick sinus syndrome: Secondary | ICD-10-CM

## 2021-08-01 LAB — CUP PACEART REMOTE DEVICE CHECK
Battery Impedance: 2446 Ohm
Battery Remaining Longevity: 31 mo
Battery Voltage: 2.72 V
Brady Statistic AP VP Percent: 0 %
Brady Statistic AP VS Percent: 0 %
Brady Statistic AS VP Percent: 0 %
Brady Statistic AS VS Percent: 100 %
Date Time Interrogation Session: 20230109134127
Implantable Lead Implant Date: 20060823
Implantable Lead Implant Date: 20060823
Implantable Lead Location: 753859
Implantable Lead Location: 753860
Implantable Lead Model: 5076
Implantable Lead Model: 5076
Implantable Pulse Generator Implant Date: 20130925
Lead Channel Impedance Value: 385 Ohm
Lead Channel Impedance Value: 545 Ohm
Lead Channel Setting Pacing Amplitude: 1.5 V
Lead Channel Setting Pacing Amplitude: 3.5 V
Lead Channel Setting Pacing Pulse Width: 1 ms
Lead Channel Setting Sensing Sensitivity: 2 mV

## 2021-08-10 NOTE — Progress Notes (Signed)
Remote pacemaker transmission.   
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
## 2021-11-06 ENCOUNTER — Ambulatory Visit (INDEPENDENT_AMBULATORY_CARE_PROVIDER_SITE_OTHER): Payer: 59 | Admitting: Physician Assistant

## 2021-11-06 ENCOUNTER — Encounter: Payer: Self-pay | Admitting: Physician Assistant

## 2021-11-06 VITALS — BP 142/80 | HR 65 | Ht 71.0 in | Wt 344.0 lb

## 2021-11-06 DIAGNOSIS — I5032 Chronic diastolic (congestive) heart failure: Secondary | ICD-10-CM | POA: Diagnosis not present

## 2021-11-06 DIAGNOSIS — Z950 Presence of cardiac pacemaker: Secondary | ICD-10-CM | POA: Diagnosis not present

## 2021-11-06 DIAGNOSIS — I471 Supraventricular tachycardia: Secondary | ICD-10-CM

## 2021-11-06 DIAGNOSIS — Z79899 Other long term (current) drug therapy: Secondary | ICD-10-CM

## 2021-11-06 DIAGNOSIS — I10 Essential (primary) hypertension: Secondary | ICD-10-CM

## 2021-11-06 LAB — CUP PACEART INCLINIC DEVICE CHECK
Battery Impedance: 2631 Ohm
Battery Remaining Longevity: 28 mo
Battery Voltage: 2.7 V
Brady Statistic AP VP Percent: 0 %
Brady Statistic AP VS Percent: 0 %
Brady Statistic AS VP Percent: 0 %
Brady Statistic AS VS Percent: 100 %
Date Time Interrogation Session: 20230417174305
Implantable Lead Implant Date: 20060823
Implantable Lead Implant Date: 20060823
Implantable Lead Location: 753859
Implantable Lead Location: 753860
Implantable Lead Model: 5076
Implantable Lead Model: 5076
Implantable Pulse Generator Implant Date: 20130925
Lead Channel Impedance Value: 382 Ohm
Lead Channel Impedance Value: 528 Ohm
Lead Channel Pacing Threshold Amplitude: 0.75 V
Lead Channel Pacing Threshold Amplitude: 1.25 V
Lead Channel Pacing Threshold Pulse Width: 0.4 ms
Lead Channel Pacing Threshold Pulse Width: 1 ms
Lead Channel Sensing Intrinsic Amplitude: 2 mV
Lead Channel Sensing Intrinsic Amplitude: 4 mV
Lead Channel Setting Pacing Amplitude: 1.5 V
Lead Channel Setting Pacing Amplitude: 3.5 V
Lead Channel Setting Pacing Pulse Width: 1 ms
Lead Channel Setting Sensing Sensitivity: 2 mV

## 2021-11-06 MED ORDER — ATORVASTATIN CALCIUM 40 MG PO TABS: 40.0000 mg | ORAL_TABLET | Freq: Every day | ORAL | 2 refills | Status: DC

## 2021-11-06 NOTE — Patient Instructions (Signed)
Medication Instructions:  ? ?Your physician recommends that you continue on your current medications as directed. Please refer to the Current Medication list given to you today. ? ? ?*If you need a refill on your cardiac medications before your next appointment, please call your pharmacy* ? ? ?Lab Work:  CMET AND LIPIDS  ? ?If you have labs (blood work) drawn today and your tests are completely normal, you will receive your results only by: ?MyChart Message (if you have MyChart) OR ?A paper copy in the mail ?If you have any lab test that is abnormal or we need to change your treatment, we will call you to review the results. ? ? ?Testing/Procedures: NONE ORDERED  TODAY ? ? ? ? ?Follow-Up: ?At [facility], you and your health needs are our priority.  As part of our continuing mission to provide you with exceptional heart care, we have created designated Provider Care Teams.  These Care Teams include your primary Cardiologist (physician) and Advanced Practice Providers (APPs -  Physician Assistants and Nurse Practitioners) who all work together to provide you with the care you need, when you need it. ? ?We recommend signing up for the patient portal called "MyChart".  Sign up information is provided on this After Visit Summary.  MyChart is used to connect with patients for Virtual Visits (Telemedicine).  Patients are able to view lab/test results, encounter notes, upcoming appointments, etc.  Non-urgent messages can be sent to your provider as well.   ?To learn more about what you can do with MyChart, go to [email or web address].   ? ?Your next appointment:   ?6 month(s)  ? ?The format for your next appointment:   ?In Person ? ?Provider:   ?You may see Dr. Ladona Ridgel  one of the following Advanced Practice Providers on your designated Care Team:   ?Francis Dowse, PA-C ? ? ?Other Instructions ? ? ?Important Information About Sugar ? ? ? ? ?  ?

## 2021-11-07 LAB — COMPREHENSIVE METABOLIC PANEL
ALT: 28 IU/L (ref 0–44)
AST: 21 IU/L (ref 0–40)
Albumin/Globulin Ratio: 1.5 (ref 1.2–2.2)
Albumin: 4.5 g/dL (ref 4.0–5.0)
Alkaline Phosphatase: 109 IU/L (ref 44–121)
BUN/Creatinine Ratio: 7 — ABNORMAL LOW (ref 9–20)
BUN: 9 mg/dL (ref 6–24)
Bilirubin Total: 0.6 mg/dL (ref 0.0–1.2)
CO2: 24 mmol/L (ref 20–29)
Calcium: 9.7 mg/dL (ref 8.7–10.2)
Chloride: 105 mmol/L (ref 96–106)
Creatinine, Ser: 1.33 mg/dL — ABNORMAL HIGH (ref 0.76–1.27)
Globulin, Total: 3 g/dL (ref 1.5–4.5)
Glucose: 77 mg/dL (ref 70–99)
Potassium: 4.4 mmol/L (ref 3.5–5.2)
Sodium: 144 mmol/L (ref 134–144)
Total Protein: 7.5 g/dL (ref 6.0–8.5)
eGFR: 65 mL/min/{1.73_m2} (ref >59)

## 2021-11-07 LAB — LIPID PANEL
Chol/HDL Ratio: 4.1 ratio (ref 0.0–5.0)
Cholesterol, Total: 155 mg/dL (ref 100–199)
HDL: 38 mg/dL — ABNORMAL LOW (ref >39)
LDL Chol Calc (NIH): 86 mg/dL (ref 0–99)
Triglycerides: 178 mg/dL — ABNORMAL HIGH (ref 0–149)
VLDL Cholesterol Cal: 31 mg/dL (ref 5–40)

## 2022-01-15 ENCOUNTER — Telehealth: Payer: Self-pay | Admitting: Internal Medicine

## 2022-01-15 ENCOUNTER — Ambulatory Visit (INDEPENDENT_AMBULATORY_CARE_PROVIDER_SITE_OTHER): Payer: Medicaid Other

## 2022-01-15 DIAGNOSIS — I495 Sick sinus syndrome: Secondary | ICD-10-CM

## 2022-01-15 NOTE — Telephone Encounter (Signed)
Patient returning call.

## 2022-01-16 LAB — CUP PACEART REMOTE DEVICE CHECK
Battery Impedance: 2959 Ohm
Battery Remaining Longevity: 24 mo
Battery Voltage: 2.7 V
Brady Statistic AP VP Percent: 0 %
Brady Statistic AP VS Percent: 0 %
Brady Statistic AS VP Percent: 0 %
Brady Statistic AS VS Percent: 100 %
Date Time Interrogation Session: 20230625155201
Implantable Lead Implant Date: 20060823
Implantable Lead Implant Date: 20060823
Implantable Lead Location: 753859
Implantable Lead Location: 753860
Implantable Lead Model: 5076
Implantable Lead Model: 5076
Implantable Pulse Generator Implant Date: 20130925
Lead Channel Impedance Value: 380 Ohm
Lead Channel Impedance Value: 527 Ohm
Lead Channel Setting Pacing Amplitude: 1.5 V
Lead Channel Setting Pacing Amplitude: 3.5 V
Lead Channel Setting Pacing Pulse Width: 1 ms
Lead Channel Setting Sensing Sensitivity: 2 mV

## 2022-02-02 ENCOUNTER — Other Ambulatory Visit: Payer: Self-pay

## 2022-02-02 ENCOUNTER — Telehealth: Payer: Self-pay | Admitting: Internal Medicine

## 2022-02-02 MED ORDER — TRIAMTERENE-HCTZ 37.5-25 MG PO TABS: 1.0000 | ORAL_TABLET | Freq: Every day | ORAL | 2 refills | Status: DC

## 2022-02-02 NOTE — Telephone Encounter (Signed)
*  STAT* If patient is at the pharmacy, call can be transferred to refill team.   1. Which medications need to be refilled? (please list name of each medication and dose if known) triamterene-hydrochlorothiazide (MAXZIDE-25) 37.5-25 MG tablet  2. Which pharmacy/location (including street and city if local pharmacy) is medication to be sent to? [facility] - [place], [place] - [address]  3. Do they need a 30 day or 90 day supply? 90

## 2022-02-08 NOTE — Progress Notes (Signed)
Remote pacemaker transmission.   
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
## 2022-02-16 ENCOUNTER — Ambulatory Visit (INDEPENDENT_AMBULATORY_CARE_PROVIDER_SITE_OTHER): Payer: Self-pay | Admitting: Physician Assistant

## 2022-02-16 ENCOUNTER — Encounter: Payer: Self-pay | Admitting: Physician Assistant

## 2022-02-16 VITALS — BP 138/72 | HR 64 | Ht 71.0 in | Wt 346.0 lb

## 2022-02-16 DIAGNOSIS — I5032 Chronic diastolic (congestive) heart failure: Secondary | ICD-10-CM

## 2022-02-16 DIAGNOSIS — Z950 Presence of cardiac pacemaker: Secondary | ICD-10-CM

## 2022-02-16 DIAGNOSIS — I10 Essential (primary) hypertension: Secondary | ICD-10-CM

## 2022-02-16 DIAGNOSIS — I471 Supraventricular tachycardia: Secondary | ICD-10-CM

## 2022-02-16 LAB — CUP PACEART INCLINIC DEVICE CHECK
Battery Impedance: 3078 Ohm
Battery Remaining Longevity: 22 mo
Battery Voltage: 2.69 V
Brady Statistic AP VP Percent: 0 %
Brady Statistic AP VS Percent: 0 %
Brady Statistic AS VP Percent: 0 %
Brady Statistic AS VS Percent: 100 %
Date Time Interrogation Session: 20230728131028
Implantable Lead Implant Date: 20060823
Implantable Lead Implant Date: 20060823
Implantable Lead Location: 753859
Implantable Lead Location: 753860
Implantable Lead Model: 5076
Implantable Lead Model: 5076
Implantable Pulse Generator Implant Date: 20130925
Lead Channel Impedance Value: 380 Ohm
Lead Channel Impedance Value: 518 Ohm
Lead Channel Pacing Threshold Amplitude: 0.75 V
Lead Channel Pacing Threshold Amplitude: 1.25 V
Lead Channel Pacing Threshold Pulse Width: 0.4 ms
Lead Channel Pacing Threshold Pulse Width: 1 ms
Lead Channel Sensing Intrinsic Amplitude: 2 mV
Lead Channel Sensing Intrinsic Amplitude: 4 mV
Lead Channel Setting Pacing Amplitude: 1.5 V
Lead Channel Setting Pacing Amplitude: 3.5 V
Lead Channel Setting Pacing Pulse Width: 1 ms
Lead Channel Setting Sensing Sensitivity: 2 mV

## 2022-02-16 NOTE — Patient Instructions (Signed)
Medication Instructions:  Your physician recommends that you continue on your current medications as directed. Please refer to the Current Medication list given to you today.  *If you need a refill on your cardiac medications before your next appointment, please call your pharmacy*   Lab Work: None  If you have labs (blood work) drawn today and your tests are completely normal, you will receive your results only by: MyChart Message (if you have MyChart) OR A paper copy in the mail If you have any lab test that is abnormal or we need to change your treatment, we will call you to review the results.   Testing/Procedures: Your physician has requested that you have an echocardiogram. Echocardiography is a painless test that uses sound waves to create images of your heart. It provides your doctor with information about the size and shape of your heart and how well your heart's chambers and valves are working. This procedure takes approximately one hour. There are no restrictions for this procedure.   Follow-Up: At [facility], you and your health needs are our priority.  As part of our continuing mission to provide you with exceptional heart care, we have created designated Provider Care Teams.  These Care Teams include your primary Cardiologist (physician) and Advanced Practice Providers (APPs -  Physician Assistants and Nurse Practitioners) who all work together to provide you with the care you need, when you need it.   Your next appointment:   1 year(s)  The format for your next appointment:   In Person  Provider:   You may see [name], MD or one of the following Advanced Practice Providers on your designated Care Team:   Maryella Shivers   Other Instructions Medical Weight Loss & Management [phone]

## 2022-03-20 ENCOUNTER — Ambulatory Visit (HOSPITAL_COMMUNITY): Payer: Self-pay | Attending: Cardiovascular Disease

## 2022-03-20 DIAGNOSIS — I5032 Chronic diastolic (congestive) heart failure: Secondary | ICD-10-CM

## 2022-03-20 LAB — ECHOCARDIOGRAM COMPLETE
Area-P 1/2: 3.12 cm2
S' Lateral: 3.4 cm

## 2022-03-20 MED ORDER — PERFLUTREN LIPID MICROSPHERE
1.0000 mL | INTRAVENOUS | Status: AC | PRN
  Administered 2022-03-20: 1 mL via INTRAVENOUS

## 2022-05-21 ENCOUNTER — Other Ambulatory Visit: Payer: Self-pay | Admitting: Internal Medicine

## 2022-09-13 ENCOUNTER — Ambulatory Visit (INDEPENDENT_AMBULATORY_CARE_PROVIDER_SITE_OTHER): Payer: Self-pay

## 2022-09-13 DIAGNOSIS — I495 Sick sinus syndrome: Secondary | ICD-10-CM

## 2022-09-14 LAB — CUP PACEART REMOTE DEVICE CHECK
Battery Impedance: 3328 Ohm
Battery Remaining Longevity: 19 mo
Battery Voltage: 2.67 V
Brady Statistic AP VP Percent: 0 %
Brady Statistic AP VS Percent: 0 %
Brady Statistic AS VP Percent: 0 %
Brady Statistic AS VS Percent: 100 %
Date Time Interrogation Session: 20240222125238
Implantable Lead Connection Status: 753985
Implantable Lead Connection Status: 753985
Implantable Lead Implant Date: 20060823
Implantable Lead Implant Date: 20060823
Implantable Lead Location: 753859
Implantable Lead Location: 753860
Implantable Lead Model: 5076
Implantable Lead Model: 5076
Implantable Pulse Generator Implant Date: 20130925
Lead Channel Impedance Value: 397 Ohm
Lead Channel Impedance Value: 532 Ohm
Lead Channel Setting Pacing Amplitude: 1.5 V
Lead Channel Setting Pacing Amplitude: 3.5 V
Lead Channel Setting Pacing Pulse Width: 1 ms
Lead Channel Setting Sensing Sensitivity: 2 mV
Zone Setting Status: 755011
Zone Setting Status: 755011

## 2022-10-08 NOTE — Progress Notes (Signed)
Remote pacemaker transmission.   

## 2022-11-19 ENCOUNTER — Telehealth: Payer: Self-pay | Admitting: Internal Medicine

## 2022-11-19 NOTE — Telephone Encounter (Signed)
Spoke with the patient who reports that he has been having constant chest pain for about 3 weeks now. He states that it is a tightness under his left breast. He went to the ER at [facility] last week and testing came back normal. He states that he thought it could be some indigestion. He has tried Tums and Malox with some relief. He is currently taking Zantac. He states that pain is positional and he can reproduce pain when he pushes on his chest. He does not recall doing any abnormal movements or heavy lifting that could have caused a pulled muscle. He states that when he is resting or lying down the pain eases off. He went back to the ER yesterday at [facility] with normal testing and was sent home. He states that they recommended he reach out to his cardiologist to see if he needed to have a stress test done.

## 2022-11-19 NOTE — Telephone Encounter (Signed)
Pt c/o of Chest Pain: STAT if CP now or developed within 24 hours  1. Are you having CP right now?   Yes  2. Are you experiencing any other symptoms (ex. SOB, nausea, vomiting, sweating)?   No  3. How long have you been experiencing CP?   Started a couple of weeks ago  4. Is your CP continuous or coming and going?   Coming and going  5. Have you taken Nitroglycerin?   No  Patient stated he has been to ED twice (last week and last night) and they told him he may need a Stress Test.

## 2022-11-25 NOTE — Telephone Encounter (Signed)
He has had many heart caths. His pain is non-cardiac. Might consider adding something stronger than Zantac like nexium or prilosec. GT

## 2022-11-27 NOTE — Telephone Encounter (Signed)
Pt called back per message received from Dr. [name].    Pt stated he ran out, and has been off of his prilosec for a few weeks now.  Pt has PCP appointment Thursday, 11/29/2022;  Pt advised to address the Prilosec with his PCP, and should help his non-cardiac pain concern.  If not, pt advised to reach out [facility].  Pt also shared he has mild swelling in his lower extremities.  This started just started the first of the week, and is slightly present.  Pt advised to monitor this symptom, and that I would advise Dr. Ladona Ridgel through a message.  Pt also advised to show the PCP during the office visit, and call us on Thursday for follow up with me.  Dr. Ladona Ridgel and I are in clinic on Thursday 5/9, and can also obtain new orders then.  Pt understood plan of care, and will reach out to [place] if symptoms worsen, or go to nearest ER, per the education he received from me.  Follow up is required.

## 2022-11-29 NOTE — Telephone Encounter (Signed)
Agree with PCP followup. If his peripheral edema cannot be controlled with a low sodium diet, elevation of the legs and conservative measures I can see him in followup. GT

## 2022-12-21 ENCOUNTER — Ambulatory Visit (INDEPENDENT_AMBULATORY_CARE_PROVIDER_SITE_OTHER): Payer: Self-pay

## 2022-12-21 DIAGNOSIS — I495 Sick sinus syndrome: Secondary | ICD-10-CM

## 2022-12-21 LAB — CUP PACEART REMOTE DEVICE CHECK
Battery Impedance: 3655 Ohm
Battery Remaining Longevity: 16 mo
Battery Voltage: 2.66 V
Brady Statistic AP VP Percent: 0 %
Brady Statistic AP VS Percent: 0 %
Brady Statistic AS VP Percent: 0 %
Brady Statistic AS VS Percent: 100 %
Date Time Interrogation Session: 20240531001626
Implantable Lead Connection Status: 753985
Implantable Lead Connection Status: 753985
Implantable Lead Implant Date: 20060823
Implantable Lead Implant Date: 20060823
Implantable Lead Location: 753859
Implantable Lead Location: 753860
Implantable Lead Model: 5076
Implantable Lead Model: 5076
Implantable Pulse Generator Implant Date: 20130925
Lead Channel Impedance Value: 390 Ohm
Lead Channel Impedance Value: 549 Ohm
Lead Channel Setting Pacing Amplitude: 1.5 V
Lead Channel Setting Pacing Amplitude: 3.5 V
Lead Channel Setting Pacing Pulse Width: 1 ms
Lead Channel Setting Sensing Sensitivity: 2 mV
Zone Setting Status: 755011
Zone Setting Status: 755011

## 2023-01-08 NOTE — Progress Notes (Signed)
Remote pacemaker transmission.   

## 2023-03-05 ENCOUNTER — Ambulatory Visit: Payer: 59 | Attending: Internal Medicine | Admitting: Internal Medicine

## 2023-03-05 ENCOUNTER — Encounter: Payer: Self-pay | Admitting: Internal Medicine

## 2023-03-05 VITALS — BP 160/90 | HR 63 | Ht 71.0 in | Wt 353.0 lb

## 2023-03-05 DIAGNOSIS — I5032 Chronic diastolic (congestive) heart failure: Secondary | ICD-10-CM | POA: Diagnosis not present

## 2023-03-05 LAB — CUP PACEART INCLINIC DEVICE CHECK
Battery Impedance: 4329 Ohm
Battery Remaining Longevity: 10 mo
Battery Voltage: 2.64 V
Brady Statistic AP VP Percent: 0 %
Brady Statistic AP VS Percent: 0 %
Brady Statistic AS VP Percent: 0 %
Brady Statistic AS VS Percent: 100 %
Date Time Interrogation Session: 20240813164845
Implantable Lead Connection Status: 753985
Implantable Lead Connection Status: 753985
Implantable Lead Implant Date: 20060823
Implantable Lead Implant Date: 20060823
Implantable Lead Location: 753859
Implantable Lead Location: 753860
Implantable Lead Model: 5076
Implantable Lead Model: 5076
Implantable Pulse Generator Implant Date: 20130925
Lead Channel Impedance Value: 396 Ohm
Lead Channel Impedance Value: 526 Ohm
Lead Channel Pacing Threshold Amplitude: 1 V
Lead Channel Pacing Threshold Pulse Width: 1 ms
Lead Channel Setting Pacing Amplitude: 1.5 V
Lead Channel Setting Pacing Amplitude: 2 V
Lead Channel Setting Pacing Pulse Width: 1 ms
Lead Channel Setting Sensing Sensitivity: 2 mV
Zone Setting Status: 755011
Zone Setting Status: 755011

## 2023-03-05 LAB — LIPID PANEL
Chol/HDL Ratio: 4.8 ratio (ref 0.0–5.0)
Cholesterol, Total: 159 mg/dL (ref 100–199)
HDL: 33 mg/dL — ABNORMAL LOW (ref >39)
LDL Chol Calc (NIH): 97 mg/dL (ref 0–99)
Triglycerides: 166 mg/dL — ABNORMAL HIGH (ref 0–149)
VLDL Cholesterol Cal: 29 mg/dL (ref 5–40)

## 2023-03-05 LAB — BASIC METABOLIC PANEL
BUN/Creatinine Ratio: 7 — ABNORMAL LOW (ref 9–20)
BUN: 9 mg/dL (ref 6–24)
CO2: 22 mmol/L (ref 20–29)
Calcium: 9.3 mg/dL (ref 8.7–10.2)
Chloride: 105 mmol/L (ref 96–106)
Creatinine, Ser: 1.24 mg/dL (ref 0.76–1.27)
Glucose: 90 mg/dL (ref 70–99)
Potassium: 4.3 mmol/L (ref 3.5–5.2)
Sodium: 141 mmol/L (ref 134–144)
eGFR: 70 mL/min/{1.73_m2} (ref >59)

## 2023-03-05 MED ORDER — ATORVASTATIN CALCIUM 40 MG PO TABS: 40.0000 mg | ORAL_TABLET | Freq: Every day | ORAL | 3 refills | Status: DC

## 2023-03-05 NOTE — Progress Notes (Signed)
HPI Jim Hill returns today for ongoing evaluation and management of morbid obesity, HTN, tachy-brady syndrome. He still has occaisional palpitations. He has done some walking. He has been unable to lose weight. He is up 2 lbs from his last visit in 18  months ago. He admits to dietary indiscretion. He has had some peripheral edema. He was in the hospital with back pain and got a cath which was negative. No CAD. He is considering Mounjaro.  Allergies  Allergen Reactions   Lisinopril Hives   Lisinopril-Hydrochlorothiazide Shortness Of Breath    Felt like he was going to have a MI   Lactose Intolerance (Gi) Nausea And Vomiting   Lactulose Nausea And Vomiting   Pork-Derived Products Hives, Nausea And Vomiting and Other (See Comments)     headache   Tilactase Nausea And Vomiting     Current Outpatient Medications  Medication Sig Dispense Refill   aspirin 81 MG chewable tablet Chew 81 mg by mouth daily.     metoprolol tartrate (LOPRESSOR) 50 MG tablet TAKE 1 TABLET BY MOUTH 4 TIMES DAILY AS NEEDED FOR PALPITATIONS 120 tablet 8   omeprazole (PRILOSEC) 40 MG capsule Take 1 capsule by mouth daily.     triamterene-hydrochlorothiazide (MAXZIDE-25) 37.5-25 MG tablet Take 1 tablet by mouth daily. 90 tablet 2   atorvastatin (LIPITOR) 40 MG tablet Take 1 tablet (40 mg total) by mouth daily. 90 tablet 3   No current facility-administered medications for this visit.     Past Medical History:  Diagnosis Date   Barrett's esophagus without dysplasia - subcentimeter changes 06/19/2015   Chest pain    normal LHC 2008;  ETT-Echo 8/10: normal   Delayed gastric emptying 07/09/2015   Diastolic heart failure (HCC)    Gastroparesis 08/19/2015   GERD (gastroesophageal reflux disease)    Hypertension    Obesity    OSA (obstructive sleep apnea)    Other specified cardiac dysrhythmias(427.89)    Sleep apnea    SVT (supraventricular tachycardia)    Symptomatic bradycardia    s/p pacer in 2006  in [place]    ROS:   All systems reviewed and negative except as noted in the HPI.   Past Surgical History:  Procedure Laterality Date   APPENDECTOMY  1994   COLONOSCOPY     INSERT / REPLACE / REMOVE PACEMAKER     status post pacemaker implant August 2006   PERMANENT PACEMAKER GENERATOR CHANGE N/A 04/16/2012   Procedure: PERMANENT PACEMAKER GENERATOR CHANGE;  Surgeon: Marinus Maw, MD;  Location: [facility] CATH LAB;  Service: Cardiovascular;  Laterality: N/A;   UPPER GASTROINTESTINAL ENDOSCOPY       Family History  Problem Relation Age of Onset   Hypertension Maternal Grandmother    Hypertension Maternal Grandfather    Hypertension Paternal Grandmother    Heart disease Paternal Grandfather    Hypertension Paternal Grandfather    Prostate cancer Paternal Grandfather    Healthy Mother    Healthy Father    Hypertension Other    Diabetes Other    Cancer Other    CAD Other    Pancreatic cancer Paternal Uncle      Social History   Socioeconomic History   Marital status: Single    Spouse name: Not on file   Number of children: Not on file   Years of education: Not on file   Highest education level: Not on file  Occupational History   Occupation: truck driver  Tobacco Use  Smoking status: Never   Smokeless tobacco: Never  Vaping Use   Vaping status: Never Used  Substance and Sexual Activity   Alcohol use: Not Currently    Comment: occasional on the weekends (2-3 drinks, liquor and beer)   Drug use: No   Sexual activity: Yes  Other Topics Concern   Not on file  Social History Narrative   Tobacco history none. He does admit to using recreational drugs in the past. He has not smoked marijuana in 7 years.    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 Both of his parents are in there 50 s and are  in good health.  Lives alone in a one story home.  Has 6 children.     Education: college.   Social Determinants of Health   Financial  Resource Strain: Low Risk  (03/17/2021)   Received from [facility], [facility]   Overall Financial Resource Strain (CARDIA)    Difficulty of Paying Living Expenses: Not hard at all  Food Insecurity: No Food Insecurity (03/17/2021)   Received from [facility], [facility]   Hunger Vital Sign    Worried About Running Out of Food in the Last Year: Never true    Ran Out of Food in the Last Year: Never true  Transportation Needs: No Transportation Needs (03/17/2021)   Received from [facility], [facility]   [facility] - Transportation    Lack of Transportation (Medical): No    Lack of Transportation (Non-Medical): No  Physical Activity: Insufficiently Active (03/18/2019)   Received from [facility], [facility]   Exercise Vital Sign    Days of Exercise per Week: 3 days    Minutes of Exercise per Session: 20 min  Stress: No Stress Concern Present (03/18/2019)   Received from [facility], [facility] - Occupational Stress Questionnaire    Feeling of Stress : Only a little  Social Connections: Unknown (03/18/2019)   Received from [facility], [facility]   Social Connection and Isolation Panel [NHANES]    Frequency of Communication with Friends and Family: Once a week    Frequency of Social Gatherings with Friends and Family: Once a week    Attends Religious Services: Not on Marketing executive or Organizations: No    Attends Banker Meetings: Never    Marital Status: Married  Catering manager Violence: Not At Risk (03/18/2019)   Received from [facility], [facility]   Humiliation, Afraid, Rape, and Kick questionnaire    Fear of Current or Ex-Partner: No    Emotionally Abused: No    Physically Abused: No    Sexually Abused: No     BP (!) 160/90   Pulse 63   Ht 5\' 11"  (1.803 m)   Wt (!) 353 lb (160.1 kg)   SpO2 97%   BMI 49.23 kg/m   Physical Exam:  Well  appearing NAD HEENT: Unremarkable Neck:  No JVD, no thyromegally Lymphatics:  No adenopathy Back:  No CVA tenderness Lungs:  Clear HEART:  Regular rate rhythm, no murmurs, no rubs, no clicks Abd:  soft, positive bowel sounds, no organomegally, no rebound, no guarding Ext:  2 plus pulses, no edema, no cyanosis, no clubbing Skin:  No rashes no nodules Neuro:  CN II through XII intact, motor  grossly intact  EKG - nsr  DEVICE  Normal device function.  See PaceArt for details.   Assess/Plan: 1. 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 Interrogation demonstrates sinus tachy vs AT.  2. PPM - his medtronic DDD PM is working normally. His RV threshold is a bit elevated but stable. Real question is whether we should not place a new PPM when he reaches ERI. He has paced less than 0.1% of the time. He is programmed VVI 30/min. He is about a year from ERI. 3. Obesity - we discussed the importance of weight loss and encouraged him to try one of the GLP receptor antagonists. 4. HTN - his sbp is minimally elevated. We will follow.   Jim Gowda Almalik Weissberg,MD

## 2023-03-05 NOTE — Patient Instructions (Addendum)
Medication Instructions:  Your physician recommends that you continue on your current medications as directed. Please refer to the Current Medication list given to you today.  *If you need a refill on your cardiac medications before your next appointment, please call your pharmacy*  Lab Work: Lipid and BMP today  If you have labs (blood work) drawn today and your tests are completely normal, you will receive your results only by: MyChart Message (if you have MyChart) OR A paper copy in the mail If you have any lab test that is abnormal or we need to change your treatment, we will call you to review the results.  Testing/Procedures: None ordered.  Follow-Up: At [facility], you and your health needs are our priority.  As part of our continuing mission to provide you with exceptional heart care, we have created designated Provider Care Teams.  These Care Teams include your primary Cardiologist (physician) and Advanced Practice Providers (APPs -  Physician Assistants and Nurse Practitioners) who all work together to provide you with the care you need, when you need it.  Your next appointment:   1 year(s)  The format for your next appointment:   In Person  Provider:   [name], MD{or one of the following Advanced Practice Providers on your designated Care Team:   Francis Dowse, [place] Casimiro Needle "Mardelle Matte" [place], [place] Earnest Rosier, NP  Remote monitoring is used to monitor your Pacemaker/ ICD from home. This monitoring reduces the number of office visits required to check your device to one time per year. It allows [place] to keep an eye on the functioning of your device to ensure it is working properly. You are scheduled for a device check from home on 03/22/23. You may send your transmission at any time that day. If you have a wireless device, the transmission will be sent automatically. After your physician reviews your transmission, you will receive a postcard with your next transmission  date.  Important Information About Sugar

## 2023-03-21 ENCOUNTER — Other Ambulatory Visit: Payer: Self-pay | Admitting: Internal Medicine

## 2023-03-22 ENCOUNTER — Ambulatory Visit (INDEPENDENT_AMBULATORY_CARE_PROVIDER_SITE_OTHER): Payer: 59

## 2023-03-22 DIAGNOSIS — I495 Sick sinus syndrome: Secondary | ICD-10-CM

## 2023-03-26 LAB — CUP PACEART REMOTE DEVICE CHECK
Battery Impedance: 4356 Ohm
Battery Remaining Longevity: 17 mo
Battery Voltage: 2.67 V
Brady Statistic AP VP Percent: 0 %
Brady Statistic AP VS Percent: 0 %
Brady Statistic AS VP Percent: 0 %
Brady Statistic AS VS Percent: 100 %
Date Time Interrogation Session: 20240831123405
Implantable Lead Connection Status: 753985
Implantable Lead Connection Status: 753985
Implantable Lead Implant Date: 20060823
Implantable Lead Implant Date: 20060823
Implantable Lead Location: 753859
Implantable Lead Location: 753860
Implantable Lead Model: 5076
Implantable Lead Model: 5076
Implantable Pulse Generator Implant Date: 20130925
Lead Channel Impedance Value: 386 Ohm
Lead Channel Impedance Value: 537 Ohm
Lead Channel Setting Pacing Amplitude: 1.5 V
Lead Channel Setting Pacing Amplitude: 2 V
Lead Channel Setting Pacing Pulse Width: 1 ms
Lead Channel Setting Sensing Sensitivity: 2 mV
Zone Setting Status: 755011
Zone Setting Status: 755011

## 2023-03-26 NOTE — Progress Notes (Signed)
Remote pacemaker transmission.   

## 2023-06-18 ENCOUNTER — Telehealth: Payer: Self-pay | Admitting: Internal Medicine

## 2023-06-18 ENCOUNTER — Encounter: Payer: Self-pay | Admitting: Internal Medicine

## 2023-06-18 NOTE — Telephone Encounter (Signed)
Pt states he is trying to get a letter for his DOT physical. Please advise

## 2023-06-18 NOTE — Telephone Encounter (Signed)
Addressed in My Chart message

## 2023-06-18 NOTE — Telephone Encounter (Signed)
Patient states he has his DOT physical today and needs a letter from Dr. Ladona Ridgel clearing him to drive to show the provider at his appointment today.  Will forward to Dr. Ladona Ridgel and his nurse.

## 2023-06-21 ENCOUNTER — Ambulatory Visit (INDEPENDENT_AMBULATORY_CARE_PROVIDER_SITE_OTHER): Payer: 59

## 2023-06-21 DIAGNOSIS — I495 Sick sinus syndrome: Secondary | ICD-10-CM | POA: Diagnosis not present

## 2023-07-23 NOTE — Progress Notes (Signed)
Remote pacemaker transmission.   

## 2023-08-02 ENCOUNTER — Ambulatory Visit (INDEPENDENT_AMBULATORY_CARE_PROVIDER_SITE_OTHER): Payer: 59

## 2023-08-02 DIAGNOSIS — I495 Sick sinus syndrome: Secondary | ICD-10-CM

## 2023-08-08 ENCOUNTER — Encounter: Payer: Self-pay | Admitting: Internal Medicine

## 2023-08-08 ENCOUNTER — Telehealth: Payer: Self-pay | Admitting: *Deleted

## 2023-08-08 NOTE — Telephone Encounter (Signed)
   Patient Name: Jim Hill  DOB: 06/22/1971 MRN: [id number]  Primary Cardiologist: [name], MD  Chart reviewed as part of pre-operative protocol coverage.  Patient follows with [facility] for device clinic only.  Device clearance request has been sent.  Medical/cardiac clearance should come from primary cardiologist [facility] cardiology).   I will route this recommendation to the requesting party via Epic fax function and remove from pre-op pool.  Please call with questions.  Joylene Grapes, NP 08/08/2023, 4:23 PM

## 2023-08-08 NOTE — Progress Notes (Signed)
PERIOPERATIVE PRESCRIPTION FOR IMPLANTED CARDIAC DEVICE PROGRAMMING   Patient Information:  Patient: Jim Hill  MRN: [id number]  Date of Birth: 07-12-1971    Surgeon:  Jacqulyn Liner, MD Surgeon's Group or Practice Name:  [facility] Phone number:  [phone] Fax number:  [phone] Planned Procedure:  EGD  Date of Procedure:  08/13/2023    Device Information:   Clinic EP Physician:   Dr. [name] Device Type:  Pacemaker Manufacturer and Phone #:  Medtronic: [phone] Pacemaker Dependent?:  No Date of Last Device Check:  06/30/2023        Normal Device Function?:  Yes     Electrophysiologist's Recommendations:   Have magnet available. Provide continuous ECG monitoring when magnet is used or reprogramming is to be performed.  Procedure may interfere with device function.  Magnet should be placed over device during procedure.  Per Device Clinic Standing Orders, Lenor Coffin  08/08/2023 3:57 PM

## 2023-08-08 NOTE — Telephone Encounter (Signed)
   Pre-operative Risk Assessment    Patient Name: Jim Hill  DOB: 1971/02/25 MRN: [id number]   Date of last office visit: 03/05/2023 Date of next office visit: None   Request for Surgical Clearance    Procedure:   EGD  Date of Surgery:  Clearance 08/13/23                                 Surgeon:  Jacqulyn Liner, MD Surgeon's Group or Practice Name:  [facility] Phone number:  [phone] Fax number:  [phone]   Type of Clearance Requested:   - Medical  - Pharmacy:  Hold Aspirin not indicated   Type of Anesthesia:  Not Indicated   Additional requests/questions:    Signed, Emmit Pomfret   08/08/2023, 2:58 PM

## 2023-08-08 NOTE — Telephone Encounter (Signed)
Patient is followed in device. Device clearance was sent.

## 2023-08-09 ENCOUNTER — Telehealth: Payer: Self-pay

## 2023-08-09 LAB — CUP PACEART REMOTE DEVICE CHECK
Battery Impedance: 7257 Ohm
Battery Voltage: 2.64 V
Brady Statistic RV Percent Paced: 0 %
Date Time Interrogation Session: 20250117013448
Implantable Lead Connection Status: 753985
Implantable Lead Connection Status: 753985
Implantable Lead Implant Date: 20060823
Implantable Lead Implant Date: 20060823
Implantable Lead Location: 753859
Implantable Lead Location: 753860
Implantable Lead Model: 5076
Implantable Lead Model: 5076
Implantable Pulse Generator Implant Date: 20130925
Lead Channel Impedance Value: 542 Ohm
Lead Channel Impedance Value: 67 Ohm
Lead Channel Setting Pacing Amplitude: 2 V
Lead Channel Setting Pacing Pulse Width: 1 ms
Lead Channel Setting Sensing Sensitivity: 2 mV
Zone Setting Status: 755011
Zone Setting Status: 755011

## 2023-08-09 NOTE — Telephone Encounter (Signed)
RRT triggered 07/12/23. Routed to clinic for review.  Patient now in back up pacing made VVI 65bpm.  Dual Chamber PPM for SND  Spoke with patient.  Since being in "back up mode" patient has been symptomatic with SOB Since 12/20.  You can see urgent care and office visits for SOB and some "pressure feeling in chest".   Patient is scheduled to see A. 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

## 2023-08-09 NOTE — Progress Notes (Signed)
  Electrophysiology Office Note:   ID:  Jim Hill, DOB 09/14/1970, MRN [id number]  Primary Cardiologist: [name], MD Electrophysiologist: None *** {Click to update primary MD,subspecialty MD or APP then REFRESH:1}    History of Present Illness:   Jim Hill is a 53 y.o. male with h/o *** seen today for {VISITTYPE:28148}  Review of systems complete and found to be negative unless listed in HPI.   EP Information / Studies Reviewed:    {EKGtoday:28818}       PPM Interrogation-  reviewed in detail today,  See PACEART report.  Device History: {INDUSTRY:28136} {Blank single:19197::"Dual Chamber","Single Chamber","BiV","Leadless"} PPM implanted *** for {Blank single:19197::"Sinus Node Dysfunction","CHB","Second Degree AV block","Tachy-Brady syndrome","Uncontrolled atrial arrhyhtmia s/p AV node ablation"}  ***  Physical Exam:   VS:  There were no vitals taken for this visit.   Wt Readings from Last 3 Encounters:  03/05/23 (!) 353 lb (160.1 kg)  02/16/22 (!) 346 lb (156.9 kg)  11/06/21 (!) 344 lb (156 kg)     GEN: No acute distress  NECK: No JVD; No carotid bruits CARDIAC: {EPRHYTHM:28826::"Regular rate and rhythm"}, no murmurs, rubs, gallops RESPIRATORY:  Clear to auscultation without rales, wheezing or rhonchi  ABDOMEN: Soft, non-tender, non-distended EXTREMITIES:  {EDEMA LEVEL:28147::"No"} edema; No deformity   ASSESSMENT AND PLAN:    {Blank single:19197::"SND","CHB","Second Degree AV block","Tachy-Brady syndrome","Sick sinus syndrome","Symptomatic bradycardia","Uncontrolled atrial arrhyhtmia s/p AV node ablation"} s/p {INDUSTRY:28136} PPM  Normal PPM function See Pace Art report No changes today  {Click here to Review PMH, Prob List, Meds, Allergies, SHx, FHx  :1}   Disposition:   Follow up with {EPPROVIDERS:28135} {EPFOLLOW UP:28173}  Signed, Graciella Freer, PA-C

## 2023-08-13 ENCOUNTER — Encounter: Payer: Self-pay | Admitting: Student

## 2023-08-13 ENCOUNTER — Encounter: Payer: Self-pay | Admitting: *Deleted

## 2023-08-13 ENCOUNTER — Ambulatory Visit: Payer: Medicaid Other | Attending: Student | Admitting: Student

## 2023-08-13 VITALS — BP 128/86 | HR 65 | Ht 71.0 in | Wt 354.4 lb

## 2023-08-13 DIAGNOSIS — Z950 Presence of cardiac pacemaker: Secondary | ICD-10-CM | POA: Diagnosis not present

## 2023-08-13 DIAGNOSIS — I471 Supraventricular tachycardia, unspecified: Secondary | ICD-10-CM

## 2023-08-13 NOTE — Patient Instructions (Signed)
 Medication Instructions:  Your physician recommends that you continue on your current medications as directed. Please refer to the Current Medication list given to you today.  *If you need a refill on your cardiac medications before your next appointment, please call your pharmacy*  Lab Work: BMET, CBC-TODAY If you have labs (blood work) drawn today and your tests are completely normal, you will receive your results only by: MyChart Message (if you have MyChart) OR A paper copy in the mail If you have any lab test that is abnormal or we need to change your treatment, we will call you to review the results.  Testing/Procedures: See letter  Follow-Up: At [facility], you and your health needs are our priority.  As part of our continuing mission to provide you with exceptional heart care, we have created designated Provider Care Teams.  These Care Teams include your primary Cardiologist (physician) and Advanced Practice Providers (APPs -  Physician Assistants and Nurse Practitioners) who all work together to provide you with the care you need, when you need it.  Your next appointment:   Your follow up appointments will be scheduled for you and print out on your discharge summary after your procedure.

## 2023-08-14 LAB — BASIC METABOLIC PANEL
BUN/Creatinine Ratio: 10 (ref 9–20)
BUN: 14 mg/dL (ref 6–24)
CO2: 22 mmol/L (ref 20–29)
Calcium: 9.8 mg/dL (ref 8.7–10.2)
Chloride: 104 mmol/L (ref 96–106)
Creatinine, Ser: 1.44 mg/dL — ABNORMAL HIGH (ref 0.76–1.27)
Glucose: 90 mg/dL (ref 70–99)
Potassium: 4.3 mmol/L (ref 3.5–5.2)
Sodium: 140 mmol/L (ref 134–144)
eGFR: 58 mL/min/{1.73_m2} — ABNORMAL LOW (ref >59)

## 2023-08-14 LAB — CBC
Hematocrit: 41.2 % (ref 37.5–51.0)
Hemoglobin: 13.6 g/dL (ref 13.0–17.7)
MCH: 27.9 pg (ref 26.6–33.0)
MCHC: 33 g/dL (ref 31.5–35.7)
MCV: 84 fL (ref 79–97)
Platelets: 232 10*3/uL (ref 150–450)
RBC: 4.88 x10E6/uL (ref 4.14–5.80)
RDW: 14.3 % (ref 11.6–15.4)
WBC: 6.5 10*3/uL (ref 3.4–10.8)

## 2023-08-19 ENCOUNTER — Telehealth: Payer: Self-pay | Admitting: Internal Medicine

## 2023-08-19 ENCOUNTER — Encounter: Payer: Self-pay | Admitting: Internal Medicine

## 2023-08-19 NOTE — Telephone Encounter (Signed)
Patient is sick and would like to reschedule his gen changeout procedure.

## 2023-08-19 NOTE — Telephone Encounter (Signed)
Pt has been rescheduled for 2/19 at 8:30. No additional labs needed.   Updated instruction letter will be sent via MyChart.

## 2023-08-30 ENCOUNTER — Telehealth: Payer: Self-pay | Admitting: Internal Medicine

## 2023-08-30 NOTE — Telephone Encounter (Signed)
 Patient stated he was returning a staff call he received today.

## 2023-09-10 NOTE — Progress Notes (Signed)
 Remote pacemaker transmission.

## 2023-09-10 NOTE — Addendum Note (Signed)
 Addended by: Elease Etienne A on: 09/10/2023 10:52 AM   Modules accepted: Orders

## 2023-09-10 NOTE — Pre-Procedure Instructions (Signed)
Instructed patient on the following items: Arrival time 0630 Nothing to eat or drink after midnight No meds AM of procedure Responsible person to drive you home and stay with you for 24 hrs Wash with special soap night before and morning of procedure  

## 2023-09-11 ENCOUNTER — Ambulatory Visit (HOSPITAL_COMMUNITY)
Admission: RE | Admit: 2023-09-11 | Discharge: 2023-09-11 | Disposition: A | Discharge status: Home / Self Care | Payer: 59 | Attending: Internal Medicine | Admitting: Internal Medicine

## 2023-09-11 ENCOUNTER — Other Ambulatory Visit: Payer: Self-pay

## 2023-09-11 ENCOUNTER — Encounter (HOSPITAL_COMMUNITY): Payer: Self-pay | Admitting: Internal Medicine

## 2023-09-11 ENCOUNTER — Encounter (HOSPITAL_COMMUNITY)
Admission: RE | Disposition: A | Discharge status: Home / Self Care | Payer: Self-pay | Source: Home / Self Care | Attending: Internal Medicine

## 2023-09-11 DIAGNOSIS — Z45010 Encounter for checking and testing of cardiac pacemaker pulse generator [battery]: Secondary | ICD-10-CM | POA: Diagnosis not present

## 2023-09-11 DIAGNOSIS — I495 Sick sinus syndrome: Secondary | ICD-10-CM | POA: Diagnosis not present

## 2023-09-11 DIAGNOSIS — Z6841 Body Mass Index (BMI) 40.0 and over, adult: Secondary | ICD-10-CM | POA: Diagnosis not present

## 2023-09-11 DIAGNOSIS — I5032 Chronic diastolic (congestive) heart failure: Secondary | ICD-10-CM | POA: Insufficient documentation

## 2023-09-11 DIAGNOSIS — I110 Hypertensive heart disease with heart failure: Secondary | ICD-10-CM | POA: Diagnosis not present

## 2023-09-11 HISTORY — PX: PPM GENERATOR CHANGEOUT: EP1233

## 2023-09-11 LAB — NO BLOOD PRODUCTS

## 2023-09-11 SURGERY — PPM GENERATOR CHANGEOUT

## 2023-09-11 MED ORDER — MIDAZOLAM HCL 2 MG/2ML IJ SOLN
INTRAMUSCULAR | Status: AC
  Filled 2023-09-11: qty 2

## 2023-09-11 MED ORDER — SODIUM CHLORIDE 0.9 % IV SOLN: 80.0000 mg | INTRAVENOUS | Status: AC

## 2023-09-11 MED ORDER — LIDOCAINE HCL (PF) 1 % IJ SOLN
INTRAMUSCULAR | Status: DC | PRN
  Administered 2023-09-11: 50 mL

## 2023-09-11 MED ORDER — ONDANSETRON HCL 4 MG/2ML IJ SOLN: 4.0000 mg | Freq: Four times a day (QID) | INTRAMUSCULAR | Status: DC | PRN

## 2023-09-11 MED ORDER — ACETAMINOPHEN 500 MG PO TABS
1000.0000 mg | ORAL_TABLET | Freq: Once | ORAL | Status: AC
  Administered 2023-09-11: 1000 mg via ORAL
  Filled 2023-09-11: qty 2

## 2023-09-11 MED ORDER — MIDAZOLAM HCL 5 MG/5ML IJ SOLN
INTRAMUSCULAR | Status: DC | PRN
  Administered 2023-09-11 (×2): 1 mg via INTRAVENOUS

## 2023-09-11 MED ORDER — FENTANYL CITRATE (PF) 100 MCG/2ML IJ SOLN
INTRAMUSCULAR | Status: AC
Start: 2023-09-11 — End: ?
  Filled 2023-09-11: qty 2

## 2023-09-11 MED ORDER — CEFAZOLIN SODIUM-DEXTROSE 3-4 GM/150ML-% IV SOLN
3.0000 g | INTRAVENOUS | Status: AC
  Administered 2023-09-11: 3 g via INTRAVENOUS
  Filled 2023-09-11: qty 150

## 2023-09-11 MED ORDER — LIDOCAINE HCL (PF) 1 % IJ SOLN
INTRAMUSCULAR | Status: AC
  Filled 2023-09-11: qty 60

## 2023-09-11 MED ORDER — SODIUM CHLORIDE 0.9 % IV SOLN: INTRAVENOUS | Status: DC

## 2023-09-11 MED ORDER — POVIDONE-IODINE 10 % EX SWAB
2.0000 | Freq: Once | CUTANEOUS | Status: AC
  Administered 2023-09-11: 2 via TOPICAL

## 2023-09-11 MED ORDER — CHLORHEXIDINE GLUCONATE 4 % EX SOLN: 4.0000 | Freq: Once | CUTANEOUS | Status: DC

## 2023-09-11 MED ORDER — FENTANYL CITRATE (PF) 100 MCG/2ML IJ SOLN
INTRAMUSCULAR | Status: DC | PRN
  Administered 2023-09-11: 25 ug via INTRAVENOUS
  Administered 2023-09-11: 12.5 ug via INTRAVENOUS

## 2023-09-11 MED ORDER — SODIUM CHLORIDE 0.9 % IV SOLN
INTRAVENOUS | Status: AC
  Administered 2023-09-11: 80 mg
  Filled 2023-09-11: qty 2

## 2023-09-11 SURGICAL SUPPLY — 7 items
CABLE SURGICAL S-101-97-12 (CABLE) ×1 IMPLANT
IPG PACE AZUR XT DR MRI W1DR01 (Pacemaker) IMPLANT
PACE AZURE XT DR MRI W1DR01 (Pacemaker) ×1 IMPLANT
PAD DEFIB RADIO PHYSIO CONN (PAD) ×1 IMPLANT
POUCH AIGIS-R ANTIBACT PPM (Mesh General) ×1 IMPLANT
POUCH AIGIS-R ANTIBACT PPM MED (Mesh General) IMPLANT
TRAY PACEMAKER INSERTION (PACKS) ×1 IMPLANT

## 2023-09-11 NOTE — Discharge Instructions (Signed)

## 2023-09-11 NOTE — H&P (Signed)
 Electrophysiology Office Note:   ID:  Jim Hill, DOB 09/05/1970, MRN [id number]   Primary Cardiologist: [name], MD Electrophysiologist: [name], MD       History of Present Illness:   Jim Hill is a 53 y.o. male with h/o gastroparesis, morbid obesity, HTN, OSA (w/CPAP), chronic CHF (diastolic), symptomatic bradycardia w/PPM, SVT  seen today for acute visit due to device at [facility]     Patient reports feeling gasps and intermittent palpitations since his device switched to safety mode.  he denies chest pain, palpitations, dyspnea, PND, orthopnea, nausea, vomiting, dizziness, syncope, edema, weight gain, or early satiety.    Review of systems complete and found to be negative unless listed in HPI.    EP Information / Studies Reviewed:     EKG is ordered today. Personal review as below.   EKG Interpretation Date/Time:                  Tuesday August 13 2023 07:57:33 EST Ventricular Rate:         65 PR Interval:                   QRS Duration:             192 QT Interval:                 496 QTC Calculation:515 R Axis:                         -76   Text Interpretation:Ventricular-paced rhythm When compared with ECG of 05-Mar-2023 10:58, Electronic ventricular pacemaker has replaced Sinus rhythm Confirmed by Maxine Glenn [phone]) on 08/13/2023 8:28:09 AM     PPM Interrogation-  Not saved. Device at [facility] @ VVI 65. No data to store.    Device History: MDT dual chamber PPM, Implanted 03/14/2005, gen change 2013 Programmed DDI 30 chronically   Physical Exam:   VS:  BP 128/86   Pulse 65   Ht 5\' 11"  (1.803 m)   Wt (!) 354 lb 6.4 oz (160.8 kg)   SpO2 96%   BMI 49.43 kg/m       Wt Readings from Last 3 Encounters:  08/13/23 (!) 354 lb 6.4 oz (160.8 kg)  03/05/23 (!) 353 lb (160.1 kg)  02/16/22 (!) 346 lb (156.9 kg)      GEN: No acute distress  NECK: No JVD; No carotid bruits CARDIAC: Regular rate and rhythm, no murmurs, rubs, gallops RESPIRATORY:  Clear to  auscultation without rales, wheezing or rhonchi  ABDOMEN: Soft, non-tender, non-distended EXTREMITIES:  No edema; No deformity    ASSESSMENT AND PLAN:     Tachy-Brady syndrome s/p Medtronic PPM  Device at ERI with safety mode at 65 VVI.  Hysteresis rate of 40 programmed which should relieve pacing symptoms, though spurious pacing may still occur.  Pt would prefer to proceed with generator change to both prevent spurious pacing and have a back up system in place in the event that he requires more pacing in the future.  Explained risks, benefits, and alternatives to PPM generator change, including but not limited to bleeding and infection.  Pt verbalized understanding and agrees to proceed.       Obesity Body mass index is 49.43 kg/m.  Encouraged lifestyle modification    HTN Stable on current regimen    Disposition:   Follow up with Dr. Ladona Ridgel as usual post procedure   Signed, Graciella Freer, PA-C  EP Attending  Patient seen and examined. He is well know to me with sinus node dysfunction and tachy-brady syndrome who has reached ERI on her DDD PPM. I have reviewed the indications for PPM gen change and he wishes to proceed.  Sharlot Gowda Korin Hartwell,MD

## 2023-09-12 ENCOUNTER — Telehealth: Payer: Self-pay

## 2023-09-12 ENCOUNTER — Telehealth: Payer: Self-pay | Admitting: Internal Medicine

## 2023-09-12 NOTE — Telephone Encounter (Signed)
 Pt states that he had a change out and has some questions regarding the other clinic that his info was sent to

## 2023-09-12 NOTE — Telephone Encounter (Signed)
 I tried to call [facility] at [phone] to have the pt released but now answered. I will try again Monday.

## 2023-09-12 NOTE — Telephone Encounter (Signed)
 Pt states he is being followed by Dr. Ladona Ridgel until he is retired. He do not know who [facility] is. I told him I will get him back into our system and give him a call on Monday to let him know about everything.

## 2023-09-13 NOTE — Telephone Encounter (Signed)
 Osvaldo Human assisting patient with transferring into our clinic.

## 2023-09-15 MED FILL — Midazolam HCl Inj 2 MG/2ML (Base Equivalent): INTRAMUSCULAR | Qty: 2 | Status: AC

## 2023-09-19 NOTE — Telephone Encounter (Signed)
 The pt is in our system and on a remote schedule.

## 2023-09-24 NOTE — Patient Instructions (Signed)

## 2023-09-25 ENCOUNTER — Ambulatory Visit: Payer: 59 | Attending: Cardiology

## 2023-09-25 DIAGNOSIS — I495 Sick sinus syndrome: Secondary | ICD-10-CM

## 2023-09-25 LAB — CUP PACEART INCLINIC DEVICE CHECK
Date Time Interrogation Session: 20250305104443
Implantable Lead Connection Status: 753985
Implantable Lead Connection Status: 753985
Implantable Lead Implant Date: 20060823
Implantable Lead Implant Date: 20060823
Implantable Lead Location: 753859
Implantable Lead Location: 753860
Implantable Lead Model: 5076
Implantable Lead Model: 5076
Implantable Pulse Generator Implant Date: 20250219

## 2023-09-25 NOTE — Progress Notes (Signed)
 Normal Pacemaker wound check. Wound well healed. Thresholds, sensing, and impedances consistent with implant measurements and pacing outputs appropriate for chronic leads (gen change only). No episodes. No arm restrictions, patient released to normal work duty with known protection to device site area as wound site is still fragile for next few weeks, but well healed.  Pt enrolled in remote follow-up.  NOTE:  RV lead threshold test 2.0@.4ms today which is stable per long hx trend.   He has adaptive on which will only test at 0.11ms. He never paces (.3%) in RV.  Decision made to keep programmed as is and monitor.  If patient starts RV pacing more, will need to be hard programmed to higher pulse width to avoid draining battery.   Today he was 1.25v@0 .6ms.  NO PROGRAMMING CHANGES MADE.

## 2023-12-12 NOTE — Progress Notes (Deleted)
 Cardiology Office Note:  .   Date:  12/12/2023  ID:  Jim Hill, DOB 06/04/1971, MRN [id number] PCP: Patient, No Pcp Per  [facility] Providers Cardiologist:  Manya Sells, MD Electrophysiologist:  Manya Sells, MD {  History of Present Illness: .   Jim Hill is a 53 y.o. male w/PMHx of  Gastroparesis, morbid obesity, HTN, OSA (w/CPAP),  chronic CHF (diastolic),  symptomatic bradycardia w/PPM,  SVT (unclear h/o ablation seems in 2016, this may have been in [place], Dr. Meridith Stanford note mentions He had a second catheter ablation, the only records I have state he had no inducible SVT and got a slow pathway modification for his first procedure)  He saw Jim Hill 08/13/23, device had reverted to VVI 65 with some symptoms rfe;t 2/2 to it, planned for gen change   Today's visit is scheduled as a 3 mo post gen change visit ROS:   *** site *** check outputs   Device information MDT dual chamber PPM, Implanted 03/14/2005, gen change 2013, gen change 09/11/23 Programmed DDI 30 previously >>> MVP 30 at last gen change  Arrhythmia/AAD hx Symptomatic bradycardia w/PPM,  SVT (unclear h/o ablation seems in 2016, this may have been in [place], Dr. Meridith Stanford note mentions He had a second catheter ablation, the only records I have state he had no inducible SVT and got a slow pathway modification for his first procedure)  Studies Reviewed: [name]    EKG not done today  DEVICE interrogation done today and reviewed by myself *** Battery and lead measurements are good ***   03/20/22: TTE 1. Left ventricular ejection fraction, by estimation, is 60 to 65%. The  left ventricle has normal function. The left ventricle has no regional  wall motion abnormalities. There is mild left ventricular hypertrophy.  Left ventricular diastolic parameters  were normal.   2. Pacing wires RA/RV. Right ventricular systolic function is mildly  reduced. The right ventricular size is mildly enlarged. There is  normal  pulmonary artery systolic pressure.   3. Left atrial size was moderately dilated.   4. Right atrial size was moderately dilated.   5. The mitral valve is normal in structure. No evidence of mitral valve  regurgitation. No evidence of mitral stenosis.   6. The aortic valve is tricuspid. Aortic valve regurgitation is not  visualized. No aortic stenosis is present.   7. The inferior vena cava is normal in size with greater than 50%  respiratory variability, suggesting right atrial pressure of 3 mmHg.   Comparison(s): 08/21/18 EF 55-60%.     08/21/2018: TTE IMPRESSIONS  1. The left ventricle has normal systolic function of 55-60%. The cavity  size is normal. There is normal left ventricular hypertrophy. Echo  evidence of normal diastolic filling patterns.   2. Right ventricular systolic pressure is is mildly elevated.   3. Pacing wires in RV/RA.   4. Mildly dilated left atrial size.   5. Normal right atrial size.   6. The mitral valve normal in structure.   7. Normal tricuspid valve.   8. Tricuspid regurgitation is mild.   9. Aortic valve tricuspid.  10. No atrial level shunt detected by color flow Doppler.      09/25/2016; ETT  Blood pressure demonstrated a hypertensive response to exercise. No T wave inversion was noted during stress. There was no ST segment deviation noted during stress. Overall, the patient's exercise capacity was normal. Duke Treadmill Score: low risk   85% of maximum heart rate was achieved after 6.2  minutes.  Recovery time:  5 minutes.   Negative stress test without evidence of ischemia at given workload.  06/08/2015: stress myoview Nuclear stress EF: 51%. The LV function is the lower limits of normal . The study is normal. No evidence of ischemia. This is a low risk study.   Risk Assessment/Calculations:    Physical Exam:   VS:  There were no vitals taken for this visit.   Wt Readings from Last 3 Encounters:  09/11/23 (!) 340 lb (154.2 kg)   08/13/23 (!) 354 lb 6.4 oz (160.8 kg)  03/05/23 (!) 353 lb (160.1 kg)    GEN: Well nourished, well developed in no acute distress NECK: No JVD; No carotid bruits CARDIAC: ***RRR, no murmurs, rubs, gallops RESPIRATORY:  *** CTA b/l without rales, wheezing or rhonchi  ABDOMEN: Soft, non-tender, non-distended EXTREMITIES:  *** No edema; No deformity   PPM site: *** is stable, no thinning, fluctuation, tethering  ASSESSMENT AND PLAN: .    PPM *** intact function *** no programming changes made  HTN ***     {Are you ordering a CV Procedure (e.g. stress test, cath, DCCV, TEE, etc)?   Press F2        :[id number]}     Dispo: ***  Signed, Debbie Fails, PA-C

## 2023-12-13 ENCOUNTER — Ambulatory Visit (INDEPENDENT_AMBULATORY_CARE_PROVIDER_SITE_OTHER): Payer: 59

## 2023-12-13 ENCOUNTER — Encounter: Payer: Self-pay | Admitting: Internal Medicine

## 2023-12-13 ENCOUNTER — Telehealth: Payer: Self-pay | Admitting: Internal Medicine

## 2023-12-13 ENCOUNTER — Ambulatory Visit: Payer: 59 | Admitting: Physician Assistant

## 2023-12-13 DIAGNOSIS — I495 Sick sinus syndrome: Secondary | ICD-10-CM | POA: Diagnosis not present

## 2023-12-13 NOTE — Telephone Encounter (Signed)
 Patient states he has already spoke to the device nurse who assisted him in sending a transmission. He states nurse told him chest pain may be related to acid reflux, his device looked OK, and recommended OTC omeprazole .   Patient states no further needs at this time and expressed appreciation for call.

## 2023-12-13 NOTE — Telephone Encounter (Signed)
°  1. Has your device fired? no ° °2. Is you device beeping? no ° °3. Are you experiencing draining or swelling at device site?no ° °4. Are you calling to see if we received your device transmission?yes ° °5. Have you passed out? no ° ° ° °Please route to Device Clinic Pool °

## 2023-12-13 NOTE — Telephone Encounter (Signed)
 Returned call to Pt.  Advised transmission received.  Device function WNL.  No episodes noted.  Pt indicates understanding.  He states he has some back pain that "comes and goes".  He denies shortness of breath or any other symptoms.  Pt states it may be GI in origin.  He notes he has a scheduled appointment coming up 01/03/2024.  Pt denies further needs at this time.

## 2023-12-13 NOTE — Telephone Encounter (Signed)
   Pt c/o of Chest Pain: STAT if active CP, including tightness, pressure, jaw pain, radiating pain to shoulder/upper arm/back, CP unrelieved by Nitro. Symptoms reported of SOB, nausea, vomiting, sweating.  1. Are you having CP right now? Angina,no    2. Are you experiencing any other symptoms (ex. SOB, nausea, vomiting, sweating)? sweating   3. Is your CP continuous or coming and going? Coming and going   4. Have you taken Nitroglycerin ? No does not have but has took an aspirin    5. How long have you been experiencing CP? 1 week    6. If NO CP at time of call then end call with telling Pt to call back or call 911 if Chest pain returns prior to return call from triage team.

## 2023-12-16 ENCOUNTER — Ambulatory Visit: Payer: Self-pay | Admitting: Internal Medicine

## 2023-12-16 LAB — CUP PACEART REMOTE DEVICE CHECK
Battery Remaining Longevity: 183 mo
Battery Voltage: 3.21 V
Brady Statistic AP VP Percent: 0.04 %
Brady Statistic AP VS Percent: 0.63 %
Brady Statistic AS VP Percent: 0.12 %
Brady Statistic AS VS Percent: 99.21 %
Brady Statistic RA Percent Paced: 0.65 %
Brady Statistic RV Percent Paced: 0.16 %
Date Time Interrogation Session: 20250523143457
Implantable Lead Connection Status: 753985
Implantable Lead Connection Status: 753985
Implantable Lead Implant Date: 20060823
Implantable Lead Implant Date: 20060823
Implantable Lead Location: 753859
Implantable Lead Location: 753860
Implantable Lead Model: 5076
Implantable Lead Model: 5076
Implantable Pulse Generator Implant Date: 20250219
Lead Channel Impedance Value: 285 Ohm
Lead Channel Impedance Value: 304 Ohm
Lead Channel Impedance Value: 342 Ohm
Lead Channel Impedance Value: 456 Ohm
Lead Channel Pacing Threshold Amplitude: 0.75 V
Lead Channel Pacing Threshold Amplitude: 2 V
Lead Channel Pacing Threshold Pulse Width: 0.4 ms
Lead Channel Pacing Threshold Pulse Width: 0.4 ms
Lead Channel Sensing Intrinsic Amplitude: 1.5 mV
Lead Channel Sensing Intrinsic Amplitude: 1.5 mV
Lead Channel Sensing Intrinsic Amplitude: 3.5 mV
Lead Channel Sensing Intrinsic Amplitude: 3.5 mV
Lead Channel Setting Pacing Amplitude: 1.5 V
Lead Channel Setting Pacing Amplitude: 4.25 V
Lead Channel Setting Pacing Pulse Width: 0.4 ms
Lead Channel Setting Sensing Sensitivity: 1.2 mV
Zone Setting Status: 755011

## 2024-01-03 ENCOUNTER — Ambulatory Visit: Attending: Physician Assistant | Admitting: Physician Assistant

## 2024-01-03 VITALS — BP 126/78 | HR 76 | Ht 71.0 in | Wt 345.0 lb

## 2024-01-03 DIAGNOSIS — Z950 Presence of cardiac pacemaker: Secondary | ICD-10-CM | POA: Diagnosis present

## 2024-01-03 DIAGNOSIS — Z79899 Other long term (current) drug therapy: Secondary | ICD-10-CM | POA: Insufficient documentation

## 2024-01-03 DIAGNOSIS — I10 Essential (primary) hypertension: Secondary | ICD-10-CM | POA: Insufficient documentation

## 2024-01-03 DIAGNOSIS — E781 Pure hyperglyceridemia: Secondary | ICD-10-CM | POA: Diagnosis present

## 2024-01-03 LAB — CUP PACEART INCLINIC DEVICE CHECK
Battery Remaining Longevity: 182 mo
Battery Voltage: 3.21 V
Brady Statistic AP VP Percent: 0.03 %
Brady Statistic AP VS Percent: 0.61 %
Brady Statistic AS VP Percent: 0.18 %
Brady Statistic AS VS Percent: 99.18 %
Brady Statistic RA Percent Paced: 0.62 %
Brady Statistic RV Percent Paced: 0.21 %
Date Time Interrogation Session: 20250613165829
Implantable Lead Connection Status: 753985
Implantable Lead Connection Status: 753985
Implantable Lead Implant Date: 20060823
Implantable Lead Implant Date: 20060823
Implantable Lead Location: 753859
Implantable Lead Location: 753860
Implantable Lead Model: 5076
Implantable Lead Model: 5076
Implantable Pulse Generator Implant Date: 20250219
Lead Channel Impedance Value: 323 Ohm
Lead Channel Impedance Value: 323 Ohm
Lead Channel Impedance Value: 380 Ohm
Lead Channel Impedance Value: 494 Ohm
Lead Channel Pacing Threshold Amplitude: 0.75 V
Lead Channel Pacing Threshold Amplitude: 1.75 V
Lead Channel Pacing Threshold Pulse Width: 0.4 ms
Lead Channel Pacing Threshold Pulse Width: 0.4 ms
Lead Channel Sensing Intrinsic Amplitude: 1.625 mV
Lead Channel Sensing Intrinsic Amplitude: 1.625 mV
Lead Channel Sensing Intrinsic Amplitude: 3.75 mV
Lead Channel Sensing Intrinsic Amplitude: 4.125 mV
Lead Channel Setting Pacing Amplitude: 1.5 V
Lead Channel Setting Pacing Amplitude: 3.75 V
Lead Channel Setting Pacing Pulse Width: 0.4 ms
Lead Channel Setting Sensing Sensitivity: 1.2 mV
Zone Setting Status: 755011

## 2024-01-03 NOTE — Progress Notes (Signed)
 Cardiology Office Note:  .   Date:  01/03/2024  ID:  Jim Hill, DOB June 21, 1971, MRN [id number] PCP: Patient, No Pcp Per  [facility] Providers Cardiologist:  Manya Sells, MD Electrophysiologist:  Manya Sells, MD {  History of Present Illness: .   Jim Hill is a 53 y.o. male w/PMHx of  Gastroparesis, morbid obesity, HTN, OSA (w/CPAP),  chronic CHF (diastolic),  symptomatic bradycardia w/PPM,  SVT (unclear h/o ablation seems in 2016, this may have been in [place], Dr. Meridith Stanford note mentions He had a second catheter ablation, the only records I have state he had no inducible SVT and got a slow pathway modification for his first procedure)  He saw Jonelle Neri 08/13/23, device had reverted to VVI 65 with some symptoms rfe;t 2/2 to it, planned for gen change  Gen change 09/11/23  5/23 called with c/o CP   Today's visit is scheduled as a 3 mo post gen change visit ROS:   He is accompanied by his wife He feels well Gets a pinching/momentary pain that starts at his shoulder blade and comes forward, it is randome and fleeting. Can be repetative for a few to several seconds, but not postional or exertional, very randome No associated symptoms  Denies any CP, rarely feels a snes of a funny heart beat, also momentary No near syncope or syncope, no SOB, DOE  He has atorvastatin  written but not taking(Rx by bother Dr. Carolynne Citron and myself in the past) He asks about his lipids, the medicine, and would like us  to manage it. PMD has labs from 11/14/2023 Trigs 197 HDL 35 LDL 96 Not taking atrova then either   Device information MDT dual chamber PPM, Implanted 03/14/2005, gen change 2013, gen change 09/11/23 Programmed DDI 30 previously >>> MVP 30 at last gen change  Arrhythmia/AAD hx Symptomatic bradycardia w/PPM,  SVT (unclear h/o ablation seems in 2016, this may have been in [place], Dr. Meridith Stanford note mentions He had a second catheter ablation, the only records I have state he  had no inducible SVT and got a slow pathway modification for his first procedure)  Studies Reviewed: [name]    EKG not done today  DEVICE interrogation done today and reviewed by myself Battery and lead measurements are good One 1:1 AT/SVT seconds in duration AP 0.6% VP 0.2% Programmed MVP 30bpm   03/20/22: TTE 1. Left ventricular ejection fraction, by estimation, is 60 to 65%. The  left ventricle has normal function. The left ventricle has no regional  wall motion abnormalities. There is mild left ventricular hypertrophy.  Left ventricular diastolic parameters  were normal.   2. Pacing wires RA/RV. Right ventricular systolic function is mildly  reduced. The right ventricular size is mildly enlarged. There is normal  pulmonary artery systolic pressure.   3. Left atrial size was moderately dilated.   4. Right atrial size was moderately dilated.   5. The mitral valve is normal in structure. No evidence of mitral valve  regurgitation. No evidence of mitral stenosis.   6. The aortic valve is tricuspid. Aortic valve regurgitation is not  visualized. No aortic stenosis is present.   7. The inferior vena cava is normal in size with greater than 50%  respiratory variability, suggesting right atrial pressure of 3 mmHg.   Comparison(s): 08/21/18 EF 55-60%.     08/21/2018: TTE IMPRESSIONS  1. The left ventricle has normal systolic function of 55-60%. The cavity  size is normal. There is normal left ventricular hypertrophy. Echo  evidence of  normal diastolic filling patterns.   2. Right ventricular systolic pressure is is mildly elevated.   3. Pacing wires in RV/RA.   4. Mildly dilated left atrial size.   5. Normal right atrial size.   6. The mitral valve normal in structure.   7. Normal tricuspid valve.   8. Tricuspid regurgitation is mild.   9. Aortic valve tricuspid.  10. No atrial level shunt detected by color flow Doppler.      09/25/2016; ETT  Blood pressure demonstrated a  hypertensive response to exercise. No T wave inversion was noted during stress. There was no ST segment deviation noted during stress. Overall, the patient's exercise capacity was normal. Duke Treadmill Score: low risk   85% of maximum heart rate was achieved after 6.2 minutes.  Recovery time:  5 minutes.   Negative stress test without evidence of ischemia at given workload.  06/08/2015: stress myoview Nuclear stress EF: 51%. The LV function is the lower limits of normal . The study is normal. No evidence of ischemia. This is a low risk study.   Risk Assessment/Calculations:    Physical Exam:   VS:  There were no vitals taken for this visit.   Wt Readings from Last 3 Encounters:  09/11/23 (!) 340 lb (154.2 kg)  08/13/23 (!) 354 lb 6.4 oz (160.8 kg)  03/05/23 (!) 353 lb (160.1 kg)    GEN: Well nourished, well developed in no acute distress NECK: No JVD; No carotid bruits CARDIAC: RRR, no murmurs, rubs, gallops RESPIRATORY:  CTA b/l without rales, wheezing or rhonchi  ABDOMEN: Soft, non-tender, non-distended EXTREMITIES:  No edema; No deformity   PPM site: is stable, no thinning, fluctuation, tethering  ASSESSMENT AND PLAN: .    PPM intact function no programming changes made  HTN Looks ok  3. Atypical pain Sounds musculoskeleta  4. Dyslipidemia Advised dietary changes and weight loss Discussed heart heathy low trig diet Repeat lipids in ~ 3 mo   Dispo: lipids as above, otherwise remotes as usual and in clinic in a year, sooner if needed  Signed, Debbie Fails, PA-C

## 2024-01-03 NOTE — Addendum Note (Signed)
 Addended by: Zeb Heys on: 01/03/2024 04:11 PM   Modules accepted: Orders

## 2024-01-03 NOTE — Patient Instructions (Signed)
 Medication Instructions:   Your physician recommends that you continue on your current medications as directed. Please refer to the Current Medication list given to you today.   *If you need a refill on your cardiac medications before your next appointment, please call your pharmacy*  Lab Work  PLEASE GO DOWN STAIRS  [facility]  FIRST FLOOR   ( GET OFF ELEVATORS WALK TOWARDS WAITING AREA LAB LOCATED BY PHARMACY):   LFT AND LIPIDS     If you have labs (blood work) drawn today and your tests are completely normal, you will receive your results only by: MyChart Message (if you have MyChart) OR A paper copy in the mail If you have any lab test that is abnormal or we need to change your treatment, we will call you to review the results.  Testing/Procedures: NONE ORDERED  TODAY    Follow-Up: At [facility], you and your health needs are our priority.  As part of our continuing mission to provide you with exceptional heart care, our providers are all part of one team.  This team includes your primary Cardiologist (physician) and Advanced Practice Providers or APPs (Physician Assistants and Nurse Practitioners) who all work together to provide you with the care you need, when you need it.  Your next appointment:   1 year(s)  Provider:   You may see Manya Sells, MD  or one of the following Advanced Practice Providers on your designated Care Team:   Mertha Abrahams, [place]   We recommend signing up for the patient portal called MyChart.  Sign up information is provided on this After Visit Summary.  MyChart is used to connect with patients for Virtual Visits (Telemedicine).  Patients are able to view lab/test results, encounter notes, upcoming appointments, etc.  Non-urgent messages can be sent to your provider as well.   To learn more about what you can do with MyChart, go to [email or web address].

## 2024-01-04 LAB — HEPATIC FUNCTION PANEL
ALT: 30 IU/L (ref 0–44)
AST: 31 IU/L (ref 0–40)
Albumin: 4.6 g/dL (ref 3.8–4.9)
Alkaline Phosphatase: 99 IU/L (ref 44–121)
Bilirubin Total: 0.8 mg/dL (ref 0.0–1.2)
Bilirubin, Direct: 0.2 mg/dL (ref 0.00–0.40)
Total Protein: 7.6 g/dL (ref 6.0–8.5)

## 2024-01-04 LAB — LIPID PANEL
Chol/HDL Ratio: 5.2 ratio — ABNORMAL HIGH (ref 0.0–5.0)
Cholesterol, Total: 170 mg/dL (ref 100–199)
HDL: 33 mg/dL — ABNORMAL LOW (ref >39)
LDL Chol Calc (NIH): 101 mg/dL — ABNORMAL HIGH (ref 0–99)
Triglycerides: 206 mg/dL — ABNORMAL HIGH (ref 0–149)
VLDL Cholesterol Cal: 36 mg/dL (ref 5–40)

## 2024-01-07 ENCOUNTER — Ambulatory Visit: Payer: Self-pay | Admitting: Physician Assistant

## 2024-01-10 ENCOUNTER — Other Ambulatory Visit: Payer: Self-pay | Admitting: *Deleted

## 2024-01-10 DIAGNOSIS — E781 Pure hyperglyceridemia: Secondary | ICD-10-CM

## 2024-01-20 NOTE — Progress Notes (Signed)
 Remote pacemaker transmission.

## 2024-01-20 NOTE — Addendum Note (Signed)
 Addended by: VICCI SELLER A on: 01/20/2024 02:56 PM   Modules accepted: Orders

## 2024-03-13 ENCOUNTER — Ambulatory Visit (INDEPENDENT_AMBULATORY_CARE_PROVIDER_SITE_OTHER): Payer: 59

## 2024-03-13 DIAGNOSIS — I471 Supraventricular tachycardia, unspecified: Secondary | ICD-10-CM | POA: Diagnosis not present

## 2024-03-19 ENCOUNTER — Ambulatory Visit: Payer: Self-pay | Admitting: Internal Medicine

## 2024-03-19 LAB — CUP PACEART REMOTE DEVICE CHECK
Battery Remaining Longevity: 180 mo
Battery Voltage: 3.19 V
Brady Statistic AP VP Percent: 0.04 %
Brady Statistic AP VS Percent: 0.73 %
Brady Statistic AS VP Percent: 0.19 %
Brady Statistic AS VS Percent: 99.04 %
Brady Statistic RA Percent Paced: 0.75 %
Brady Statistic RV Percent Paced: 0.23 %
Date Time Interrogation Session: 20250827200252
Implantable Lead Connection Status: 753985
Implantable Lead Connection Status: 753985
Implantable Lead Implant Date: 20060823
Implantable Lead Implant Date: 20060823
Implantable Lead Location: 753859
Implantable Lead Location: 753860
Implantable Lead Model: 5076
Implantable Lead Model: 5076
Implantable Pulse Generator Implant Date: 20250219
Lead Channel Impedance Value: 285 Ohm
Lead Channel Impedance Value: 285 Ohm
Lead Channel Impedance Value: 342 Ohm
Lead Channel Impedance Value: 456 Ohm
Lead Channel Pacing Threshold Amplitude: 0.625 V
Lead Channel Pacing Threshold Amplitude: 2 V
Lead Channel Pacing Threshold Pulse Width: 0.4 ms
Lead Channel Pacing Threshold Pulse Width: 0.4 ms
Lead Channel Sensing Intrinsic Amplitude: 1.25 mV
Lead Channel Sensing Intrinsic Amplitude: 1.25 mV
Lead Channel Sensing Intrinsic Amplitude: 3.375 mV
Lead Channel Sensing Intrinsic Amplitude: 3.375 mV
Lead Channel Setting Pacing Amplitude: 1.5 V
Lead Channel Setting Pacing Amplitude: 4 V
Lead Channel Setting Pacing Pulse Width: 0.4 ms
Lead Channel Setting Sensing Sensitivity: 1.2 mV
Zone Setting Status: 755011

## 2024-03-19 NOTE — Progress Notes (Signed)
 Remote PPM Transmission

## 2024-04-15 ENCOUNTER — Encounter: Payer: Self-pay | Admitting: Cardiovascular Disease

## 2024-04-15 ENCOUNTER — Ambulatory Visit: Attending: Cardiology | Admitting: Cardiovascular Disease

## 2024-04-15 ENCOUNTER — Telehealth: Payer: Self-pay | Admitting: Internal Medicine

## 2024-04-15 VITALS — BP 138/88 | HR 68 | Ht 71.0 in | Wt 361.6 lb

## 2024-04-15 DIAGNOSIS — I495 Sick sinus syndrome: Secondary | ICD-10-CM | POA: Diagnosis present

## 2024-04-15 DIAGNOSIS — I10 Essential (primary) hypertension: Secondary | ICD-10-CM | POA: Diagnosis not present

## 2024-04-15 DIAGNOSIS — Z950 Presence of cardiac pacemaker: Secondary | ICD-10-CM | POA: Insufficient documentation

## 2024-04-15 DIAGNOSIS — G4733 Obstructive sleep apnea (adult) (pediatric): Secondary | ICD-10-CM | POA: Insufficient documentation

## 2024-04-15 DIAGNOSIS — I471 Supraventricular tachycardia, unspecified: Secondary | ICD-10-CM | POA: Diagnosis not present

## 2024-04-15 DIAGNOSIS — R0609 Other forms of dyspnea: Secondary | ICD-10-CM | POA: Diagnosis present

## 2024-04-15 DIAGNOSIS — I5032 Chronic diastolic (congestive) heart failure: Secondary | ICD-10-CM | POA: Insufficient documentation

## 2024-04-15 NOTE — Progress Notes (Signed)
 04/15/2024 [name]   [birth year]  [id number]  Primary Physician Gupta, Apoorva, MD Primary Cardiologist: Dorn JINNY Lesches MD GENI CODY [place], [place]  HPI:  Jim Hill is a 53 y.o. morbidly overweight engaged African-American male father of 7, grandfather 6 grandchildren referred because of dyspnea on exertion.  He works as a Charity fundraiser.  His cardiac risk factors include occasional cigar smoking, history of hypertension and hyperlipidemia.  His father apparently had an MI in his 69s.  He had a permanent pacemaker placed 2006 with 2 generator changes since that time most recently by Dr. 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  There is a question of SVT in the past.  He has obstructive sleep apnea on CPAP.  He really does not exercise.  He complains of some dyspnea on exertion and some atypical chest burning.   Current Meds  Medication Sig   acetaminophen  (TYLENOL ) 500 MG tablet Take 1,000 mg by mouth every 6 (six) hours as needed for moderate pain (pain score 4-6).   albuterol  (VENTOLIN  HFA) 108 (90 Base) MCG/ACT inhaler Inhale 2 puffs into the lungs as needed.   aspirin  81 MG chewable tablet Chew 81 mg by mouth daily.   atorvastatin  (LIPITOR) 40 MG tablet Take 40 mg by mouth daily.   metoprolol  tartrate (LOPRESSOR ) 50 MG tablet TAKE 1 TABLET BY MOUTH 4 TIMES DAILY AS NEEDED FOR PALPITATIONS   omeprazole  (PRILOSEC OTC) 20 MG tablet Take 20 mg by mouth daily as needed (acid reflux).   triamterene -hydrochlorothiazide (MAXZIDE-25) 37.5-25 MG tablet Take 1 tablet by mouth once daily     Allergies  Allergen Reactions   Lisinopril Hives and Shortness Of Breath   Lactose Intolerance (Gi) Nausea And Vomiting   Lactulose Nausea And Vomiting   Pork-Derived Products Hives, Nausea And Vomiting and Other (See Comments)     headache   Tilactase Nausea And Vomiting    Social History   Socioeconomic History   Marital status: Single    Spouse name: Not on file   Number of  children: Not on file   Years of education: Not on file   Highest education level: Not on file  Occupational History   Occupation: truck driver  Tobacco Use   Smoking status: Never   Smokeless tobacco: Never  Vaping Use   Vaping status: Never Used  Substance and Sexual Activity   Alcohol use: Not Currently    Comment: occasional on the weekends (2-3 drinks, liquor and beer)   Drug use: No   Sexual activity: Yes  Other Topics Concern   Not on file  Social History Narrative   Tobacco history none. He does admit to using recreational drugs in the past. He has not smoked marijuana in 7 years.    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 Both of his parents are in there 50 s and are  in good health.  Lives alone in a one story home.  Has 6 children.     Education: college.   Social Drivers of Health   Financial Resource Strain: High Risk (11/15/2023)   Received from [facility]   Overall Financial Resource Strain (CARDIA)    Difficulty of Paying Living Expenses: Hard  Food Insecurity: High Risk (11/15/2023)   Received from [facility]   Food Insecurity    Within the past 12 months, did the food you bought just not last and you  didn't have money to get more?: Yes    Within the past 12 months, did you worry that your food would run out before you got money to buy more?: Yes  Transportation Needs: High Risk (11/15/2023)   Received from [facility]   Transportation Needs    Within the past 12 months, has a lack of transportation kept you from medical appointments or from doing things needed for daily living?: Yes  Physical Activity: Insufficiently Active (03/18/2019)   Received from [facility]   Exercise Vital Sign    On average, how many days per week do you engage in moderate to strenuous exercise (like a brisk walk)?: 3 days    On average, how many minutes do you engage in exercise at this level?:  20 min  Stress: No Stress Concern Present (03/18/2019)   Received from [facility] - Occupational Stress Questionnaire    Feeling of Stress : Only a little  Social Connections: Unknown (03/18/2019)   Received from [facility]   Social Connection and Isolation Panel    In a typical week, how many times do you talk on the phone with family, friends, or neighbors?: Once a week    How often do you get together with friends or relatives?: Once a week    Attends Religious Services: Not on file    Do you belong to any clubs or organizations such as church groups, unions, fraternal or athletic groups, or school groups?: No    How often do you attend meetings of the clubs or organizations you belong to?: Never    Are you married, widowed, divorced, separated, never married, or living with a partner?: Married  Intimate Partner Violence: Not At Risk (03/18/2019)   Received from [facility]   Humiliation, Afraid, Rape, and Kick questionnaire    Within the last year, have you been afraid of your partner or ex-partner?: No    Within the last year, have you been humiliated or emotionally abused in other ways by your partner or ex-partner?: No    Within the last year, have you been kicked, hit, slapped, or otherwise physically hurt by your partner or ex-partner?: No    Within the last year, have you been raped or forced to have any kind of sexual activity by your partner or ex-partner?: No     Review of Systems: General: negative for chills, fever, night sweats or weight changes.  Cardiovascular: negative for chest pain, dyspnea on exertion, edema, orthopnea, palpitations, paroxysmal nocturnal dyspnea or shortness of breath Dermatological: negative for rash Respiratory: negative for cough or wheezing Urologic: negative for hematuria Abdominal: negative for nausea, vomiting, diarrhea, bright red blood per rectum, melena, or hematemesis Neurologic:  negative for visual changes, syncope, or dizziness All other systems reviewed and are otherwise negative except as noted above.    Blood pressure 138/88, pulse 68, height 5' 11 (1.803 m), weight (!) 361 lb 9.6 oz (164 kg), SpO2 97%.  General appearance: alert and no distress Neck: no adenopathy, no carotid bruit, no JVD, supple, symmetrical, trachea midline, and thyroid  not enlarged, symmetric, no tenderness/mass/nodules Lungs: clear to auscultation bilaterally Heart: regular rate and rhythm, S1, S2 normal, no murmur, click, rub or gallop Extremities: extremities normal, atraumatic, no cyanosis or edema Pulses: 2+ and symmetric Skin: Skin color, texture, turgor normal. No rashes or lesions Neurologic: Grossly normal  EKG EKG Interpretation Date/Time:  Wednesday April 15 2024  14:49:08 EDT Ventricular Rate:  68 PR Interval:  190 QRS Duration:  100 QT Interval:  412 QTC Calculation: 438 R Axis:   47  Text Interpretation: Normal sinus rhythm with sinus arrhythmia Inferior infarct , age undetermined When compared with ECG of 13-Aug-2023 07:57, Sinus rhythm has replaced Electronic ventricular pacemaker Confirmed by Court Carrier [phone]) on 04/15/2024 3:20:51 PM    ASSESSMENT AND PLAN:   Pacemaker-Medtronic History of pacemaker insertion in 2006 with generator change by Dr. Waddell who follows this.  Hypertension History of essential hypertension blood pressure measured today at 162/96.  This is higher than his blood pressure usually runs.  He is on metoprolol  and Maxide.  Blood pressure reading at the end of the visit was 138/88.  Chronic diastolic heart failure (HCC) 2D echocardiogram performed 03/20/2022 revealed normal LV systolic function, mild LVH with normal diastolic parameters.  OSA (obstructive sleep apnea) On CPAP  Severe obesity (BMI >= 40) (HCC) BMI 50.  He is gained 15 pounds in the last 3 months.  I am going to refer him to the [facility] diet wellness center for  physician assisted weight loss.  He might benefit from being on a GLP-1 agonist.  Dyspnea on exertion Dyspnea on exertion.  This is probably multifactorial.  I suspect it is weight related.  I am referring him to the [facility] diet wellness center.  I am going get a 2D echo and a coronary calcium  score to further evaluate.     Carrier DOROTHA Court MD FACP,FACC,FAHA, [facility] 04/15/2024 3:35 PM

## 2024-04-15 NOTE — Assessment & Plan Note (Addendum)
 History of essential hypertension blood pressure measured today at 162/96.  This is higher than his blood pressure usually runs.  He is on metoprolol  and Maxide.  Blood pressure reading at the end of the visit was 138/88.

## 2024-04-15 NOTE — Telephone Encounter (Signed)
 Pt c/o swelling/edema: STAT if pt has developed SOB within 24 hours  If swelling, where is the swelling located? Legs and feet   How much weight have you gained and in what time span? 13 pound increase in a months span  Have you gained 2 pounds in a day or 5 pounds in a week? N/a  Do you have a log of your daily weights (if so, list)? Just from doctors visits   Are you currently taking a fluid pill? Yes  Are you currently SOB? Yes   Have you traveled recently in a car or plane for an extended period of time? No

## 2024-04-15 NOTE — Patient Instructions (Signed)
 Medication Instructions:  Your physician recommends that you continue on your current medications as directed. Please refer to the Current Medication list given to you today.  *If you need a refill on your cardiac medications before your next appointment, please call your pharmacy*  Testing/Procedures: Your physician has requested that you have an echocardiogram. Echocardiography is a painless test that uses sound waves to create images of your heart. It provides your doctor with information about the size and shape of your heart and how well your heart's chambers and valves are working. This procedure takes approximately one hour. There are no restrictions for this procedure. Please do NOT wear cologne, perfume, aftershave, or lotions (deodorant is allowed). Please arrive 15 minutes prior to your appointment time.  Please note: We ask at that you not bring children with you during ultrasound (echo/ vascular) testing. Due to room size and safety concerns, children are not allowed in the ultrasound rooms during exams. Our front office staff cannot provide observation of children in our lobby area while testing is being conducted. An adult accompanying a patient to their appointment will only be allowed in the ultrasound room at the discretion of the ultrasound technician under special circumstances. We apologize for any inconvenience.   Dr. Court has ordered a CT coronary calcium score.   Test locations:  [facility] at [facility]  [facility] at [facility]  This is $99 out of pocket.   Coronary CalciumScan A coronary calcium scan is an imaging test used to look for deposits of calcium and other fatty materials (plaques) in the inner lining of the blood vessels of the heart (coronary arteries). These deposits of calcium and plaques can partly clog and narrow the coronary arteries without  producing any symptoms or warning signs. This puts a person at risk for a heart attack. This test can detect these deposits before symptoms develop. Tell a health care provider about: Any allergies you have. All medicines you are taking, including vitamins, herbs, eye drops, creams, and over-the-counter medicines. Any problems you or family members have had with anesthetic medicines. Any blood disorders you have. Any surgeries you have had. Any medical conditions you have. Whether you are pregnant or may be pregnant. What are the risks? Generally, this is a safe procedure. However, problems may occur, including: Harm to a pregnant woman and her unborn baby. This test involves the use of radiation. Radiation exposure can be dangerous to a pregnant woman and her unborn baby. If you are pregnant, you generally should not have this procedure done. Slight increase in the risk of cancer. This is because of the radiation involved in the test. What happens before the procedure? No preparation is needed for this procedure. What happens during the procedure? You will undress and remove any jewelry around your neck or chest. You will put on a hospital gown. Sticky electrodes will be placed on your chest. The electrodes will be connected to an electrocardiogram (ECG) machine to record a tracing of the electrical activity of your heart. A CT scanner will take pictures of your heart. During this time, you will be asked to lie still and hold your breath for 2-3 seconds while a picture of your heart is being taken. The procedure may vary among health care providers and hospitals. What happens after the procedure? You can get dressed. You can return to your normal activities. It is up to you to get  the results of your test. Ask your health care provider, or the department that is doing the test, when your results will be ready. Summary A coronary calcium scan is an imaging test used to look for deposits of  calcium and other fatty materials (plaques) in the inner lining of the blood vessels of the heart (coronary arteries). Generally, this is a safe procedure. Tell your health care provider if you are pregnant or may be pregnant. No preparation is needed for this procedure. A CT scanner will take pictures of your heart. You can return to your normal activities after the scan is done. This information is not intended to replace advice given to you by your health care provider. Make sure you discuss any questions you have with your health care provider. Document Released: 01/05/2008 Document Revised: 05/28/2016 Document Reviewed: 05/28/2016 Elsevier Interactive Patient Education  2017 [employer].   Follow-Up: At [facility], you and your health needs are our priority.  As part of our continuing mission to provide you with exceptional heart care, our providers are all part of one team.  This team includes your primary Cardiologist (physician) and Advanced Practice Providers or APPs (Physician Assistants and Nurse Practitioners) who all work together to provide you with the care you need, when you need it.  Your next appointment:   3 month(s)  Provider:   Dorn Lesches, MD   We recommend signing up for the patient portal called MyChart.  Sign up information is provided on this After Visit Summary.  MyChart is used to connect with patients for Virtual Visits (Telemedicine).  Patients are able to view lab/test results, encounter notes, upcoming appointments, etc.  Non-urgent messages can be sent to your provider as well.   To learn more about what you can do with MyChart, go to [email or web address].

## 2024-04-15 NOTE — Assessment & Plan Note (Signed)
 Dyspnea on exertion.  This is probably multifactorial.  I suspect it is weight related.  I am referring him to the [facility] diet wellness center.  I am going get a 2D echo and a coronary calcium  score to further evaluate.

## 2024-04-15 NOTE — Assessment & Plan Note (Signed)
 BMI 50.  He is gained 15 pounds in the last 3 months.  I am going to refer him to the [facility] diet wellness center for physician assisted weight loss.  He might benefit from being on a GLP-1 agonist.

## 2024-04-15 NOTE — Telephone Encounter (Signed)
 Call to patient to discuss concerns about leg swelling and weight gain. He reports he has gained 13 lbs in one month and now had SOB on exertion. He saw his pulmonologist today and BP was 120/80, HR 60. He was advised by pulmonologist to f/u with cardiology for the leg swelling and SOB.   Patient was last seen on 01/03/24 with R. Leverne and does not appear to be established with general cardiology. He reports he takes his maxide as ordered. Made patient DOD appt today for 3:15 pm.

## 2024-04-15 NOTE — Assessment & Plan Note (Signed)
 2D echocardiogram performed 03/20/2022 revealed normal LV systolic function, mild LVH with normal diastolic parameters.

## 2024-04-15 NOTE — Assessment & Plan Note (Signed)
 On CPAP. ?

## 2024-04-15 NOTE — Assessment & Plan Note (Signed)
 History of pacemaker insertion in 2006 with generator change by Dr. Waddell who follows this.

## 2024-05-04 ENCOUNTER — Encounter (INDEPENDENT_AMBULATORY_CARE_PROVIDER_SITE_OTHER): Payer: Self-pay

## 2024-05-04 ENCOUNTER — Telehealth: Payer: Self-pay | Admitting: Internal Medicine

## 2024-05-04 MED ORDER — TRIAMTERENE-HCTZ 37.5-25 MG PO TABS: 1.0000 | ORAL_TABLET | Freq: Every day | ORAL | 2 refills | Status: AC

## 2024-05-04 NOTE — Telephone Encounter (Signed)
 RX sent in

## 2024-05-04 NOTE — Telephone Encounter (Signed)
*  STAT* If patient is at the pharmacy, call can be transferred to refill team.   1. Which medications need to be refilled? (please list name of each medication and dose if known)   triamterene -hydrochlorothiazide (MAXZIDE-25) 37.5-25 MG tablet     2. Would you like to learn more about the convenience, safety, & potential cost savings by using the [facility]? No    3. Are you open to using the [facility] (Type [facility]. No   4. Which pharmacy/location (including street and city if local pharmacy) is medication to be sent to?[facility] [phone] - [place], [place] - [address]    5. Do they need a 30 day or 90 day supply? 90 day   Pt is out of medication

## 2024-05-05 ENCOUNTER — Institutional Professional Consult (permissible substitution) (INDEPENDENT_AMBULATORY_CARE_PROVIDER_SITE_OTHER): Admitting: Physician Assistant

## 2024-05-22 ENCOUNTER — Ambulatory Visit (HOSPITAL_COMMUNITY)
Admission: RE | Admit: 2024-05-22 | Discharge: 2024-05-22 | Disposition: A | Discharge status: Home / Self Care | Source: Ambulatory Visit | Attending: Cardiovascular Disease | Admitting: Cardiovascular Disease

## 2024-05-22 ENCOUNTER — Ambulatory Visit (HOSPITAL_COMMUNITY)
Admission: RE | Admit: 2024-05-22 | Discharge: 2024-05-22 | Disposition: A | Discharge status: Home / Self Care | Payer: Self-pay | Source: Ambulatory Visit | Attending: Cardiovascular Disease | Admitting: Cardiovascular Disease

## 2024-05-22 DIAGNOSIS — R0609 Other forms of dyspnea: Secondary | ICD-10-CM | POA: Insufficient documentation

## 2024-05-22 DIAGNOSIS — I5032 Chronic diastolic (congestive) heart failure: Secondary | ICD-10-CM | POA: Insufficient documentation

## 2024-05-22 DIAGNOSIS — I10 Essential (primary) hypertension: Secondary | ICD-10-CM | POA: Insufficient documentation

## 2024-05-22 DIAGNOSIS — Z950 Presence of cardiac pacemaker: Secondary | ICD-10-CM | POA: Insufficient documentation

## 2024-05-22 DIAGNOSIS — G4733 Obstructive sleep apnea (adult) (pediatric): Secondary | ICD-10-CM | POA: Insufficient documentation

## 2024-05-22 DIAGNOSIS — I471 Supraventricular tachycardia, unspecified: Secondary | ICD-10-CM | POA: Insufficient documentation

## 2024-05-22 DIAGNOSIS — I495 Sick sinus syndrome: Secondary | ICD-10-CM | POA: Insufficient documentation

## 2024-05-22 LAB — ECHOCARDIOGRAM COMPLETE
Area-P 1/2: 2.84 cm2
S' Lateral: 3.2 cm

## 2024-05-22 MED ORDER — PERFLUTREN LIPID MICROSPHERE
3.0000 mL | INTRAVENOUS | Status: DC | PRN
  Administered 2024-05-22: 3 mL via INTRAVENOUS

## 2024-05-23 ENCOUNTER — Ambulatory Visit: Payer: Self-pay | Admitting: Cardiovascular Disease

## 2024-06-12 ENCOUNTER — Ambulatory Visit (INDEPENDENT_AMBULATORY_CARE_PROVIDER_SITE_OTHER): Payer: 59

## 2024-06-12 DIAGNOSIS — I5032 Chronic diastolic (congestive) heart failure: Secondary | ICD-10-CM

## 2024-06-12 LAB — CUP PACEART REMOTE DEVICE CHECK
Battery Remaining Longevity: 177 mo
Battery Voltage: 3.17 V
Brady Statistic AP VP Percent: 0.03 %
Brady Statistic AP VS Percent: 0.67 %
Brady Statistic AS VP Percent: 0.13 %
Brady Statistic AS VS Percent: 99.17 %
Brady Statistic RA Percent Paced: 0.67 %
Brady Statistic RV Percent Paced: 0.16 %
Date Time Interrogation Session: 20251120204624
Implantable Lead Connection Status: 753985
Implantable Lead Connection Status: 753985
Implantable Lead Implant Date: 20060823
Implantable Lead Implant Date: 20060823
Implantable Lead Location: 753859
Implantable Lead Location: 753860
Implantable Lead Model: 5076
Implantable Lead Model: 5076
Implantable Pulse Generator Implant Date: 20250219
Lead Channel Impedance Value: 285 Ohm
Lead Channel Impedance Value: 304 Ohm
Lead Channel Impedance Value: 342 Ohm
Lead Channel Impedance Value: 456 Ohm
Lead Channel Pacing Threshold Amplitude: 0.75 V
Lead Channel Pacing Threshold Amplitude: 2.125 V
Lead Channel Pacing Threshold Pulse Width: 0.4 ms
Lead Channel Pacing Threshold Pulse Width: 0.4 ms
Lead Channel Sensing Intrinsic Amplitude: 1.5 mV
Lead Channel Sensing Intrinsic Amplitude: 1.5 mV
Lead Channel Sensing Intrinsic Amplitude: 3.5 mV
Lead Channel Sensing Intrinsic Amplitude: 3.5 mV
Lead Channel Setting Pacing Amplitude: 1.5 V
Lead Channel Setting Pacing Amplitude: 4.5 V
Lead Channel Setting Pacing Pulse Width: 0.4 ms
Lead Channel Setting Sensing Sensitivity: 1.2 mV
Zone Setting Status: 755011

## 2024-06-14 ENCOUNTER — Ambulatory Visit: Payer: Self-pay | Admitting: Internal Medicine

## 2024-06-15 NOTE — Progress Notes (Signed)
 Remote PPM Transmission

## 2024-06-24 ENCOUNTER — Telehealth: Payer: Self-pay | Admitting: Internal Medicine

## 2024-06-24 NOTE — Telephone Encounter (Signed)
 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 Pt would like a c/b from a nurse to discuss next steps please advise

## 2024-06-25 NOTE — Telephone Encounter (Signed)
 Attempted to call patient back, no answer and unable to leave message.

## 2024-06-25 NOTE — Telephone Encounter (Signed)
  Patient is calling back to follow up. Patient's said he just need the compliance DOT letter sent to his mychart. He said its the same letter he received from Dr. Waddell. He mentioned he needed it today since he has a physical appt with his doctor. He also gave his alternative phone number:  [phone]

## 2024-06-28 NOTE — Telephone Encounter (Signed)
 Ok to copy old letter and change date. Nothing has changed.

## 2024-06-30 ENCOUNTER — Ambulatory Visit: Admitting: Cardiovascular Disease

## 2024-08-05 ENCOUNTER — Ambulatory Visit: Admitting: Cardiovascular Disease

## 2024-08-12 ENCOUNTER — Ambulatory Visit: Admitting: Cardiovascular Disease

## 2024-08-18 ENCOUNTER — Ambulatory Visit: Admitting: Cardiovascular Disease

## 2024-08-26 ENCOUNTER — Ambulatory Visit: Admitting: Cardiovascular Disease

## 2024-08-27 ENCOUNTER — Telehealth (HOSPITAL_BASED_OUTPATIENT_CLINIC_OR_DEPARTMENT_OTHER): Payer: Self-pay

## 2024-08-27 ENCOUNTER — Encounter: Payer: Self-pay | Admitting: Cardiology

## 2024-08-27 NOTE — Telephone Encounter (Signed)
 Preop clearance has been added to appt notes

## 2024-08-27 NOTE — Telephone Encounter (Signed)
"  ° °  Pre-operative Risk Assessment    Patient Name: Jim Hill  DOB: [birth year] MRN: [id number]   Date of last office visit: 04/15/2024 with Dr. Court Date of next office visit: 09/02/24 with Dr. Court  Request for Surgical Clearance    Procedure:  Bariatric Procedure - sleeve  Date of Surgery:  Clearance TBD                                 Surgeon:  does not specify Surgeon's Group or Practice Name:  [facility]  Phone number:  [phone] Fax number:  [phone]   Type of Clearance Requested:   - Medical  - Pharmacy:  Hold Aspirin  -does not specofy   Type of Anesthesia:  Not Indicated   Additional requests/questions: PT HAS A MDT PPM  Signed, Patrcia Hong L   08/27/2024, 10:03 AM   "

## 2024-08-27 NOTE — Telephone Encounter (Signed)
" ° °  Name: Jim Hill  DOB: 01/29/1971  MRN: [id number]  Primary Cardiologist: Danelle Birmingham, MD  Chart reviewed as part of pre-operative protocol coverage. Because of Jim Hill's past medical history and time since last visit, he will require a follow-up in-office visit in order to better assess preoperative cardiovascular risk.  Pre-op  covering staff: - Please schedule appointment and call patient to inform them. If patient already had an upcoming appointment within acceptable timeframe, please add pre-op  clearance to the appointment notes so provider is aware. - Please contact requesting surgeon's office via preferred method (i.e, phone, fax) to inform them of need for appointment prior to surgery.  He has an office visit already with Dr. Court on 2/11.  Aspirin  hold can be addressed at the time of office visit.  Jim LOISE Fabry, PA-C  08/27/2024, 11:14 AM   "

## 2024-08-27 NOTE — Progress Notes (Signed)
 PERIOPERATIVE PRESCRIPTION FOR IMPLANTED CARDIAC DEVICE PROGRAMMING   Patient Information:  Patient: Jim Hill  MRN: [id number]  Date of Birth: [birth year]    Procedure:  Bariatric Procedure - sleeve   Date of Surgery:  Clearance TBD                                  Surgeon:  does not specify Surgeon's Group or Practice Name:  [facility]  Phone number:  [phone] Fax number:  [phone]   Device Information:   Clinic EP Physician:   Dr. Fonda Kitty Device Type:  Pacemaker Manufacturer and Phone #:  Medtronic: [id number] Pacemaker Dependent?:  No Date of Last Device Check:  06/11/2025         Normal Device Function?:  Yes     Electrophysiologist's Recommendations:   Have magnet available. Provide continuous ECG monitoring when magnet is used or reprogramming is to be performed.  Procedure may interfere with device function.  Magnet should be placed over device during procedure.  Per Device Clinic Standing Orders, Almarie ONEIDA Shutter  08/27/2024 10:31 AM

## 2024-09-02 ENCOUNTER — Ambulatory Visit: Admitting: Cardiovascular Disease
